# Patient Record
Sex: Female | Born: 1955 | Race: White | Hispanic: No | Smoking: Never smoker
Health system: Southern US, Community
[De-identification: ages and names within clinical notes are randomized; demographics above are authoritative.]

## PROBLEM LIST (undated history)

## (undated) ENCOUNTER — Ambulatory Visit: Admission: EM | Payer: PRIVATE HEALTH INSURANCE

## (undated) DIAGNOSIS — F32A Depression, unspecified: Secondary | ICD-10-CM

## (undated) DIAGNOSIS — Z973 Presence of spectacles and contact lenses: Secondary | ICD-10-CM

## (undated) DIAGNOSIS — J302 Other seasonal allergic rhinitis: Secondary | ICD-10-CM

## (undated) DIAGNOSIS — R51 Headache: Secondary | ICD-10-CM

## (undated) DIAGNOSIS — M47816 Spondylosis without myelopathy or radiculopathy, lumbar region: Secondary | ICD-10-CM

## (undated) DIAGNOSIS — G589 Mononeuropathy, unspecified: Secondary | ICD-10-CM

## (undated) DIAGNOSIS — E669 Obesity, unspecified: Secondary | ICD-10-CM

## (undated) DIAGNOSIS — E785 Hyperlipidemia, unspecified: Secondary | ICD-10-CM

## (undated) DIAGNOSIS — I10 Essential (primary) hypertension: Secondary | ICD-10-CM

## (undated) DIAGNOSIS — G4730 Sleep apnea, unspecified: Secondary | ICD-10-CM

## (undated) DIAGNOSIS — H9319 Tinnitus, unspecified ear: Secondary | ICD-10-CM

## (undated) DIAGNOSIS — E7410 Disorder of fructose metabolism, unspecified: Secondary | ICD-10-CM

## (undated) DIAGNOSIS — Z87898 Personal history of other specified conditions: Secondary | ICD-10-CM

## (undated) DIAGNOSIS — F329 Major depressive disorder, single episode, unspecified: Secondary | ICD-10-CM

## (undated) DIAGNOSIS — R7303 Prediabetes: Secondary | ICD-10-CM

## (undated) DIAGNOSIS — M1990 Unspecified osteoarthritis, unspecified site: Secondary | ICD-10-CM

## (undated) DIAGNOSIS — K219 Gastro-esophageal reflux disease without esophagitis: Secondary | ICD-10-CM

## (undated) DIAGNOSIS — H9190 Unspecified hearing loss, unspecified ear: Secondary | ICD-10-CM

## (undated) DIAGNOSIS — G2581 Restless legs syndrome: Secondary | ICD-10-CM

## (undated) DIAGNOSIS — R011 Cardiac murmur, unspecified: Secondary | ICD-10-CM

## (undated) DIAGNOSIS — R519 Headache, unspecified: Secondary | ICD-10-CM

## (undated) DIAGNOSIS — J45909 Unspecified asthma, uncomplicated: Secondary | ICD-10-CM

## (undated) DIAGNOSIS — F419 Anxiety disorder, unspecified: Secondary | ICD-10-CM

## (undated) DIAGNOSIS — Z8709 Personal history of other diseases of the respiratory system: Secondary | ICD-10-CM

## (undated) DIAGNOSIS — R112 Nausea with vomiting, unspecified: Secondary | ICD-10-CM

## (undated) DIAGNOSIS — Z8719 Personal history of other diseases of the digestive system: Secondary | ICD-10-CM

## (undated) DIAGNOSIS — Z87440 Personal history of urinary (tract) infections: Secondary | ICD-10-CM

## (undated) DIAGNOSIS — Z9889 Other specified postprocedural states: Secondary | ICD-10-CM

## (undated) HISTORY — DX: Depression, unspecified: F32.A

## (undated) HISTORY — DX: Cardiac murmur, unspecified: R01.1

## (undated) HISTORY — DX: Hyperlipidemia, unspecified: E78.5

## (undated) HISTORY — DX: Prediabetes: R73.03

## (undated) HISTORY — PX: WRIST SURGERY: SHX841

## (undated) HISTORY — DX: Disorder of fructose metabolism, unspecified: E74.10

## (undated) HISTORY — DX: Obesity, unspecified: E66.9

## (undated) HISTORY — DX: Spondylosis without myelopathy or radiculopathy, lumbar region: M47.816

## (undated) HISTORY — PX: OTHER SURGICAL HISTORY: SHX169

## (undated) HISTORY — DX: Major depressive disorder, single episode, unspecified: F32.9

## (undated) HISTORY — PX: DIAGNOSTIC LAPAROSCOPY: SUR761

## (undated) HISTORY — DX: Sleep apnea, unspecified: G47.30

## (undated) HISTORY — PX: DILATION AND CURETTAGE OF UTERUS: SHX78

---

## 1993-01-06 HISTORY — PX: ABDOMINAL HYSTERECTOMY: SHX81

## 2000-09-22 ENCOUNTER — Encounter: Admission: RE | Admit: 2000-09-22 | Discharge: 2000-09-22 | Payer: Self-pay | Admitting: Family Medicine

## 2000-09-22 ENCOUNTER — Encounter: Payer: Self-pay | Admitting: Family Medicine

## 2000-11-03 ENCOUNTER — Encounter: Admission: RE | Admit: 2000-11-03 | Discharge: 2000-11-11 | Payer: Self-pay | Admitting: Family Medicine

## 2000-11-03 ENCOUNTER — Encounter: Admission: RE | Admit: 2000-11-03 | Discharge: 2001-02-01 | Payer: Self-pay | Admitting: Family Medicine

## 2003-08-05 ENCOUNTER — Emergency Department (HOSPITAL_COMMUNITY): Admission: EM | Admit: 2003-08-05 | Discharge: 2003-08-05 | Payer: Self-pay | Admitting: Emergency Medicine

## 2003-10-23 ENCOUNTER — Encounter: Admission: RE | Admit: 2003-10-23 | Discharge: 2003-10-23 | Payer: Self-pay | Admitting: Family Medicine

## 2004-02-16 ENCOUNTER — Encounter: Admission: RE | Admit: 2004-02-16 | Discharge: 2004-02-16 | Payer: Self-pay | Admitting: Family Medicine

## 2004-03-19 ENCOUNTER — Encounter: Admission: RE | Admit: 2004-03-19 | Discharge: 2004-03-19 | Payer: Self-pay | Admitting: Gastroenterology

## 2004-09-30 ENCOUNTER — Emergency Department (HOSPITAL_COMMUNITY): Admission: EM | Admit: 2004-09-30 | Discharge: 2004-09-30 | Payer: Self-pay | Admitting: Emergency Medicine

## 2006-01-06 LAB — HM COLONOSCOPY: HM Colonoscopy: NORMAL

## 2006-04-07 ENCOUNTER — Other Ambulatory Visit: Admission: RE | Admit: 2006-04-07 | Discharge: 2006-04-07 | Payer: Self-pay | Admitting: Family Medicine

## 2006-04-15 ENCOUNTER — Ambulatory Visit (HOSPITAL_COMMUNITY): Admission: RE | Admit: 2006-04-15 | Discharge: 2006-04-15 | Payer: Self-pay | Admitting: Family Medicine

## 2006-09-22 ENCOUNTER — Emergency Department (HOSPITAL_COMMUNITY): Admission: EM | Admit: 2006-09-22 | Discharge: 2006-09-22 | Payer: Self-pay | Admitting: Emergency Medicine

## 2007-01-28 ENCOUNTER — Encounter: Admission: RE | Admit: 2007-01-28 | Discharge: 2007-04-28 | Payer: Self-pay | Admitting: Family Medicine

## 2007-05-28 ENCOUNTER — Emergency Department (HOSPITAL_COMMUNITY): Admission: EM | Admit: 2007-05-28 | Discharge: 2007-05-28 | Payer: Self-pay | Admitting: Emergency Medicine

## 2008-06-18 ENCOUNTER — Emergency Department (HOSPITAL_COMMUNITY): Admission: EM | Admit: 2008-06-18 | Discharge: 2008-06-19 | Payer: Self-pay | Admitting: Emergency Medicine

## 2009-04-06 HISTORY — PX: KNEE SURGERY: SHX244

## 2010-01-06 LAB — HM MAMMOGRAPHY: HM Mammogram: NORMAL

## 2010-07-18 ENCOUNTER — Ambulatory Visit (INDEPENDENT_AMBULATORY_CARE_PROVIDER_SITE_OTHER): Payer: BC Managed Care – PPO | Admitting: Family Medicine

## 2010-07-18 DIAGNOSIS — E785 Hyperlipidemia, unspecified: Secondary | ICD-10-CM

## 2010-07-18 NOTE — Progress Notes (Signed)
Medical Nutrition Therapy:  Appt start time: 1000 end time:  1100.  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  Usual eating pattern includes 3 meals and 0-2 snacks per day.  Everyday foods include ~10 pork skins (= 9 g pro, 0 g CHO, 44 kcal), 3-5 c black tea, 2 c black coffee, 24 oz diet Coke.  Avoided foods include tomatoes, citrus (mouth sores), certain toothpastes give bad taste that persists.  Same sensation w/ too many diet Cokes.  Also avoids beans, broccoli (xs gas), corn (stomach ache).   24-hr recall suggests an intake of ~1100 kcal: B (9:30 AM)- 4 egg whites, 2 c pro-fortified almond milk (10 g pro, 2 g CHO), 1 c black coffee; Snk ( AM)- 1 c blk coffee; L (1 PM)- 1 [place] patty (28 g pro, 88 kcal), 1/2 c mshd potatoes (20 g CHO, 138 kcal), blk tea; D (7 PM)- Steak & Shake grilled chx (27 g pro) salad, 2 tbsp ranch + 2 tbsp f-f ranch (6 g CHO, 219 kcal); Snk (8 PM)- 10 pork skins (9 g protein, 80 kcal), 100-kcal choc cake (15 g CHO).  Yesterday was atypical b/c Safiatou seldom eats out.  Usual physical activity includes going to the gym 1-3 X wk, where she does ~30-min wts or 30-min TM walking [employer]).  She has been having L heel pain, which is somewhat limiting, and gym time has been reduced in the past 3 wks b/c of a broken finger from the gym.   [name] has lost 20 lb since June 12, when she started a wt loss program w/ Dr. Feliciana Rossetti.  The program includes weekly injections of HCG, monthly injxns B12, and a severely low-energy and -CHO diet.  The diet promotes a 3-wk rotation of 600-800 kcal the 1st 2 wks, and 1000-1200 kcal wk 3.  [name] has used the very low (600-800) kcal diet only.  Other rules of the diet include bkfst of at lst 30 g protein & no more than 4-10 g CHO; lunch and dinner of  at lst 15 g protein (low-fat frozen meals recommended), and snacks of sugar-free Jell-O, fruit, beef jerky, or protein bars.  She is ambivalent re. the HCG and B12 injxns, noting that it seems likely she would have lost wt w/ the kcal restriction alone.    Progress Towards Goal(s):  In progress.   Nutritional Diagnosis:  NB-2.1 Physical inactivity As related to heel pain.  As evidenced by exercise only 2 X wk currently. [place]-2.1 Inpaired nutrition utilization As related to fructose malabsorption.  As evidenced by diagnosis of such in Feb 2012.    Intervention:  Nutrition education.  Monitoring/Evaluation:  Dietary intake, exercise, and body weight in 3 weeks.

## 2010-07-18 NOTE — Patient Instructions (Addendum)
-   Consider seeing a dr at [facility] for your heel pain [phone]).   - If you are trying to lose weight, you want to preserve as much lean tissue (muscle) as possible.  Your high-protein intake if helpful for that; however, when you restrict kcal too severely, you end up losing muscle tissue anyway.  So- ideally, you will increase kcal at least a bit, continue high-protein intake, and increase physical activity.   - Foods to add:  veg's (check out fructose contents first); lean protein (meat, fish, poultry, eggs, dairy), limited fruit (one per day).   - Keep your yogurt intake limited to [place] type, and no more than 6 oz at a time.   - For now, stationary biking will be helpful to energy expenditure.   - Exercise goal:  35 min 5 X wk.   - For weight loss and for best health, it's crucial that we consider the OTHER 23 hours of the day besides gym time:  Look for exercise opportunities throughout the day, and try to never sit longer then 30 min at a time.   - Be sure you eat each of your 3 meals; do not under-eat.   - Fructose malabsorption:  Look over list of foods provided, and develop your own list of "allowed foods" that you want to try, and determine which of these work for you.   - Consider Weigh to Wellness class, which starts Sep 4 (meets each Tue, 5:30-6:30 PM at [facility]).   - Futures trader Meals:  [phone].   - Google "Lafonda Mosses" and his views on sugar in the American diet.   - Call Dr. Gerilyn Pilgrim if any Qs:  [phone].   - Call next week for Aug appt.

## 2010-07-25 ENCOUNTER — Ambulatory Visit (INDEPENDENT_AMBULATORY_CARE_PROVIDER_SITE_OTHER): Payer: BC Managed Care – PPO | Admitting: Family Medicine

## 2010-07-25 ENCOUNTER — Encounter: Payer: Self-pay | Admitting: Family Medicine

## 2010-07-25 DIAGNOSIS — M25561 Pain in right knee: Secondary | ICD-10-CM

## 2010-07-25 DIAGNOSIS — M79609 Pain in unspecified limb: Secondary | ICD-10-CM

## 2010-07-25 DIAGNOSIS — M25569 Pain in unspecified knee: Secondary | ICD-10-CM

## 2010-07-25 DIAGNOSIS — M79673 Pain in unspecified foot: Secondary | ICD-10-CM

## 2010-07-25 NOTE — Assessment & Plan Note (Signed)
2/2 plantar fasciitis.  No h/o cortisone injection to predispose to fat pad atrophy.  Negative calcaneal squeeze for stress fracture.  Has not adequately rehabilitated this - start home exercise program - exercises and stretches shown and advised to do these daily, handout provided.  Add heel lifts, arch binders bilaterally.  Ice, tylenol and/or mobic.  Consider cortisone injection in future if not improving as expected - pain level on exam does not suggest this will be necessary.  Can also consider formal PT.

## 2010-07-25 NOTE — Patient Instructions (Signed)
You have plantar fasciitis of both heels. Take tylenol or mobic as needed for pain  Plantar fascia stretch for 20-30 seconds (do 3 of these) in morning Lowering/raise on a step exercises 3 x 15 once or twice a day - this is very important for long term recovery. Can add heel walks, toe walks forward and backward as well Ice bucket 10-15 minutes at end of day Avoid flat shoes/barefoot walking as much as possible. Arch straps have been shown to help with pain. Heel lifts also help with pain by avoiding fully stretching the plantar fascia except when doing home exercises. Orthotics with heel lift may be helpful. Steroid injection is a consideration for short term pain relief if you are struggling. Physical therapy is also an option. >90% improve by 12 months with or without treatment but the above things improve the condition faster.  Your right knee exam is negative for damage to your ligaments, meniscus, or other concerning injury. This is likely due to arthritis that has flared up with increased activity. Take tylenol 500mg  1-2 tabs three times a day for pain. Ok to take mobic daily with food with this as needed also. Glucosamine sulfate 750mg  twice a day is a supplement that has been shown to help moderate to severe arthritis. Cortisone injections are an option if you do not improve. Shoe inserts with good arch support may be helpful. Ice 15 minutes at a time 3-4 times a day as needed to help with pain. Water aerobics and cycling with low resistance are the best two types of exercise for arthritis. Follow up with me in 6 weeks for a recheck on how your heels are doing.

## 2010-07-25 NOTE — Assessment & Plan Note (Signed)
Patient has known DJD.  Exam otherwise negative for meniscal, ligamentous damage and no injury to suggest fracture, patellar dislocation.  Testing negative for pes bursitis, patellar tendinopathy, patellofemoral syndrome.  Favor DJD flare from her increased activity.  Reassured.  Start with tylenol, can continue mobic as needed.  Icing as needed.

## 2010-07-25 NOTE — Progress Notes (Signed)
Subjective:    Patient ID: Taylor Silva, female    DOB: [birth year], 54 y.o.   MRN: [id number]  PCP: Dr. Gweneth Dimitri  HPI 55 yo F here for bilateral heel pain, right knee pain  1. Bilateral heel pain Patient reports several year history of bilateral heel pain. She did not seek care from this. Tried to self treat by trying several different shoe types and and inserts including heel cups. Now wearing full length orthotics with arch support. Pain is on plantar portion of heels. Has not tried exercises/stretches, or injection. Pain worse first thing in the morning. No pain at rest currently - hurts to stand up.  2. Right knee pain Reports she had arthroscopic procedure for debridement about 1 year ago. She did not get better with a second cortisone injection so went on to have this procedure. 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 Has been walking more for exercise. No new injury but pain started about 3 weeks ago. Takes mobic for pain No giving out, locking, catching. Pain felt anterior in knee.  Past Medical History  Diagnosis Date  . Fructose intolerance     Current Outpatient Prescriptions on File Prior to Visit  Medication Sig Dispense Refill  . aspirin-acetaminophen-caffeine (EXCEDRIN MIGRAINE) 250-250-65 MG per tablet Take 1 tablet by mouth every 6 (six) hours as needed.        Marland Kitchen azelastine (ASTELIN) 137 MCG/SPRAY nasal spray Place 1 spray into the nose 2 (two) times daily. Use in each nostril as directed       . b complex vitamins capsule Take 1 capsule by mouth daily.        . calcium carbonate (TUMS - DOSED IN MG ELEMENTAL CALCIUM) 500 MG chewable tablet Chew 2 tablets by mouth daily.        Marland Kitchen estrogens, conjugated, (PREMARIN) 0.625 MG tablet Take 0.625 mg by mouth daily. Take daily for 21 days then do not take for 7 days.       . fexofenadine (ALLEGRA) 180 MG tablet Take 180 mg by mouth daily.        . fish oil-omega-3 fatty acids 1000 MG  capsule Take 1,000 mg by mouth daily.        . Glucosamine 500 MG CAPS Take 1 capsule by mouth.        Marland Kitchen glucosamine-chondroitin 500-400 MG tablet Take 1 tablet by mouth 3 (three) times daily.        . human chorionic gonadotropin (PREGNYL/NOVAREL) 10000 UNITS injection Inject 35 Units into the muscle once a week.       . meloxicam (MOBIC) 15 MG tablet Take 15 mg by mouth daily.        . Multiple Vitamins-Minerals (MULTIVITAMIN WITH MINERALS) tablet Take 1 tablet by mouth daily.        . pantoprazole (PROTONIX) 40 MG tablet Take 40 mg by mouth daily.        . phentermine 37.5 MG capsule Take 37.5 mg by mouth every morning.        . Probiotic Product (ALIGN PO) Take 1 capsule by mouth.        Marland Kitchen rOPINIRole (REQUIP) 0.5 MG tablet Take 1 mg by mouth every evening.        . topiramate (TOPAMAX) 100 MG tablet Take 100 mg by mouth at bedtime.        Marland Kitchen VITAMIN D, CHOLECALCIFEROL, PO Take 2,000 Units by mouth.  Past Surgical History  Procedure Date  . Knee surgery     right knee  . Abdominal hysterectomy     Allergies  Allergen Reactions  . Codeine   . Penicillins   . Sulfa Antibiotics   . Tetracyclines & Related     History   Social History  . Marital Status: Married    Spouse Name: N/A    Number of Children: N/A  . Years of Education: N/A   Occupational History  . Not on file.   Social History Main Topics  . Smoking status: Never Smoker   . Smokeless tobacco: Not on file  . Alcohol Use: Not on file  . Drug Use: Not on file  . Sexually Active: Not on file   Other Topics Concern  . Not on file   Social History Narrative  . No narrative on file    Family History  Problem Relation Age of Onset  . Diabetes Mother   . Hyperlipidemia Mother   . Hypertension Mother   . Heart attack Mother   . Diabetes Sister   . Hyperlipidemia Sister   . Hypertension Sister   . Diabetes Brother   . Hyperlipidemia Brother   . Hypertension Brother   . Heart attack Brother   .  Diabetes Maternal Grandmother   . Hyperlipidemia Maternal Grandmother   . Hypertension Maternal Grandmother     BP 113/81  Pulse 86  Review of Systems See HPI above.    Objective:   Physical Exam Gen: NAD    Bilateral feet/ankles: Mod overpronation No gross deformity, swelling, ecchymoses FROM ankles No TTP (plantar anterior calcaneus area of pain when she has this per patient). Thompsons test negative. NV intact distally. Negative calcaneal squeeze  R knee: No gross deformity, ecchymoses, swelling.  Landmarks difficult to differentiate 2/2 soft tissue. Lateral TTP but not at joint line.  No medial, patellar tendon, post patellar facet, pes TTP. FROM. Negative ant/post drawers. Negative valgus/varus testing. Negative lachmanns. Negative mcmurrays, apleys, patellar apprehension, clarkes. NV intact distally.  Assessment & Plan:  1. Bilateral heel pain - 2/2 plantar fasciitis.  No h/o cortisone injection to predispose to fat pad atrophy.  Negative calcaneal squeeze for stress fracture.  Has not adequately rehabilitated this - start home exercise program - exercises and stretches shown and advised to do these daily, handout provided.  Add heel lifts, arch binders bilaterally.  Ice, tylenol and/or mobic.  Consider cortisone injection in future if not improving as expected - pain level on exam does not suggest this will be necessary.  Can also consider formal PT.  2. Right knee pain - Patient has known DJD.  Exam otherwise negative for meniscal, ligamentous damage and no injury to suggest fracture, patellar dislocation.  Testing negative for pes bursitis, patellar tendinopathy, patellofemoral syndrome.  Favor DJD flare from her increased activity.  Reassured.  Start with tylenol, can continue mobic as needed.  Icing as needed.

## 2010-07-29 ENCOUNTER — Ambulatory Visit: Payer: BC Managed Care – PPO | Admitting: Family Medicine

## 2011-05-26 ENCOUNTER — Emergency Department (HOSPITAL_COMMUNITY)
Admission: EM | Admit: 2011-05-26 | Discharge: 2011-05-26 | Disposition: A | Discharge status: Home / Self Care | Payer: BC Managed Care – PPO | Source: Home / Self Care | Attending: Emergency Medicine | Admitting: Emergency Medicine

## 2011-05-26 ENCOUNTER — Encounter (HOSPITAL_COMMUNITY): Payer: Self-pay

## 2011-05-26 DIAGNOSIS — IMO0002 Reserved for concepts with insufficient information to code with codable children: Secondary | ICD-10-CM

## 2011-05-26 DIAGNOSIS — S46912A Strain of unspecified muscle, fascia and tendon at shoulder and upper arm level, left arm, initial encounter: Secondary | ICD-10-CM

## 2011-05-26 DIAGNOSIS — M62838 Other muscle spasm: Secondary | ICD-10-CM

## 2011-05-26 HISTORY — DX: Restless legs syndrome: G25.81

## 2011-05-26 HISTORY — DX: Other seasonal allergic rhinitis: J30.2

## 2011-05-26 HISTORY — DX: Unspecified osteoarthritis, unspecified site: M19.90

## 2011-05-26 MED ORDER — DIAZEPAM 5 MG PO TABS: 5.0000 mg | ORAL_TABLET | Freq: Four times a day (QID) | ORAL | Status: AC | PRN

## 2011-05-26 MED ORDER — MELOXICAM 15 MG PO TABS: 15.0000 mg | ORAL_TABLET | Freq: Every day | ORAL | Status: DC

## 2011-05-26 MED ORDER — HYDROCODONE-ACETAMINOPHEN 5-325 MG PO TABS: 2.0000 | ORAL_TABLET | ORAL | Status: DC | PRN

## 2011-05-26 NOTE — ED Notes (Addendum)
C/o pain in left shoulder since working on a couch a couple of days ago; denies injury ; pain noted on palpation of shoulder muscle, but no pain on palpation of clavicle

## 2011-05-26 NOTE — ED Provider Notes (Signed)
History     CSN: [id number]  Arrival date & time 05/26/11  1255   First MD Initiated Contact with Patient 05/26/11 1526      Chief Complaint  Patient presents with  . Shoulder Pain    (Consider location/radiation/quality/duration/timing/severity/associated sxs/prior treatment) HPI Comments: Patient states that she was repholstering during a couch  by herself several days ago, and reports achy left neck and shoulder pain starting later that night. Pain has gotten worse since then, it is worse with movements, use. She does not recall hearing any pop, or specific movement that caused her symptoms. No nausea, vomiting, weakness, bruising, deformity. No alleviating factors. She's not tried anything for this. She has no recent or remote history of injury to her neck or shoulder. She does have a history of arthritis.  ROS as noted in HPI. All other ROS negative.   Patient is a 56 y.o. female presenting with shoulder pain. The history is provided by the patient. No language interpreter was used.  Shoulder Pain This is a new problem. The current episode started more than 2 days ago. The problem occurs constantly. The problem has not changed since onset.Pertinent negatives include no chest pain, no abdominal pain and no shortness of breath.    Past Medical History  Diagnosis Date  . Fructose intolerance   . Arthritis   . Seasonal allergies   . Restless legs     Past Surgical History  Procedure Date  . Knee surgery     right knee  . Abdominal hysterectomy     Family History  Problem Relation Age of Onset  . Diabetes Mother   . Hyperlipidemia Mother   . Hypertension Mother   . Heart attack Mother   . Diabetes Sister   . Hyperlipidemia Sister   . Hypertension Sister   . Diabetes Brother   . Hyperlipidemia Brother   . Hypertension Brother   . Heart attack Brother   . Diabetes Maternal Grandmother   . Hyperlipidemia Maternal Grandmother   . Hypertension Maternal Grandmother       History  Substance Use Topics  . Smoking status: Never Smoker   . Smokeless tobacco: Not on file  . Alcohol Use: No    OB History    Grav Para Term Preterm Abortions TAB SAB Ect Mult Living                  Review of Systems  Respiratory: Negative for shortness of breath.   Cardiovascular: Negative for chest pain.  Gastrointestinal: Negative for abdominal pain.    Allergies  Codeine; Penicillins; Sulfa antibiotics; and Tetracyclines & related  Home Medications   Current Outpatient Rx  Name Route Sig Dispense Refill  . HYDROCHLOROTHIAZIDE 12.5 MG PO CAPS Oral Take 12.5 mg by mouth daily.    Marland Kitchen LORATADINE-PSEUDOEPHEDRINE ER 10-240 MG PO TB24 Oral Take 1 tablet by mouth daily.    Marland Kitchen ALIGN PO Oral Take 1 capsule by mouth.      . ASPIRIN-ACETAMINOPHEN-CAFFEINE 250-250-65 MG PO TABS Oral Take 1 tablet by mouth every 6 (six) hours as needed.      . AZELASTINE HCL 137 MCG/SPRAY NA SOLN Nasal Place 1 spray into the nose 2 (two) times daily. Use in each nostril as directed     . B COMPLEX VITAMINS PO CAPS Oral Take 1 capsule by mouth daily.      Marland Kitchen CALCIUM CARBONATE ANTACID 500 MG PO CHEW Oral Chew 2 tablets by mouth daily.      Marland Kitchen  DIAZEPAM 5 MG PO TABS Oral Take 1 tablet (5 mg total) by mouth every 6 (six) hours as needed for anxiety. 16 tablet 0  . OMEGA-3 FATTY ACIDS 1000 MG PO CAPS Oral Take 1,000 mg by mouth daily.      Marland Kitchen GLUCOSAMINE 500 MG PO CAPS Oral Take 1 capsule by mouth.      Marland Kitchen GLUCOSAMINE-CHONDROITIN 500-400 MG PO TABS Oral Take 1 tablet by mouth 3 (three) times daily.      Marland Kitchen HYDROCODONE-ACETAMINOPHEN 5-325 MG PO TABS Oral Take 2 tablets by mouth every 4 (four) hours as needed for pain. 20 tablet 0  . MELOXICAM 15 MG PO TABS Oral Take 1 tablet (15 mg total) by mouth daily. 14 tablet 0  . MULTI-VITAMIN/MINERALS PO TABS Oral Take 1 tablet by mouth daily.      Marland Kitchen PANTOPRAZOLE SODIUM 40 MG PO TBEC Oral Take 40 mg by mouth daily.      Marland Kitchen PHENTERMINE HCL 37.5 MG PO CAPS Oral  Take 37.5 mg by mouth every morning.      Marland Kitchen ROPINIROLE HCL 0.5 MG PO TABS Oral Take 1 mg by mouth every evening.      Marland Kitchen VITAMIN D (CHOLECALCIFEROL) PO Oral Take 2,000 Units by mouth.        BP 122/87  Pulse 82  Temp(Src) 98.3 F (36.8 C) (Oral)  Resp 16  SpO2 100%  Physical Exam  Nursing note and vitals reviewed. Constitutional: She is oriented to person, place, and time. She appears well-developed and well-nourished. No distress.  HENT:  Head: Normocephalic and atraumatic.  Eyes: Conjunctivae and EOM are normal.  Neck: Normal range of motion. Muscular tenderness present. Normal range of motion present.    Cardiovascular: Normal rate.   Pulmonary/Chest: Effort normal.  Abdominal: She exhibits no distension.  Musculoskeletal: Normal range of motion.       Left shoulder with ROM mildly limited, Drop test normal,  sternoclavicular joint NT, clavicle NT , A/C joint NT , scapula NT, proximal humerus NT  shoulder joint NT, Motor strength midly decreased at shoulder due to pain, Sensation intact LT over deltoid region, distal NVI with hand having intact sensation and strength in the distribution of the median, radial, and ulnar nerve. Negative tenderness in bicipital groove,  negative empty can test , negative liftoff test,  no instability with abduction/external rotation.  Neurological: She is alert and oriented to person, place, and time.  Skin: Skin is warm and dry.  Psychiatric: She has a normal mood and affect. Her behavior is normal. Judgment and thought content normal.    ED Course  Procedures (including critical care time)  Labs Reviewed - No data to display No results found.   1. Left shoulder strain   2. Muscle spasm     MDM  H&P most consistent with muscle strain/spasm. No history of trauma. No bony tenderness, deformity, no instability on exam. Deferring x-ray at this time. Will start her on Valium, meloxicam, and she states that this has worked for her well in the  past. Patient states that codeine and morphine cause itching and rash. No anaphylaxis. Will also try Norco - states she's never tried hydrocodone or oxycodone in the past and is not sure she can tolerate this or not. Advised her do not use this if she does well with just the meloxicam and Valium. Will have her followup with the [place] [facility] if no improvement in 10 days. Patient agrees with plan.  Luiz Blare, MD 05/26/11 Paulo Fruit

## 2011-05-26 NOTE — Discharge Instructions (Signed)
[facility]: [address] [place] [address] [phone]  [facility]: [address] [place] [address]   [phone]  [facility]: [address] [place] [address] [phone]  [facility] : [address] [place] [address] [phone]  [facility]: [address] [place] [address] [phone]  [facility]: [address] [place] [address] [phone]  Dr. Mahima Pandey [address] [facility]   [address] [phone]  

## 2011-05-30 ENCOUNTER — Ambulatory Visit (INDEPENDENT_AMBULATORY_CARE_PROVIDER_SITE_OTHER): Payer: BC Managed Care – PPO | Admitting: Family

## 2011-05-30 ENCOUNTER — Encounter: Payer: Self-pay | Admitting: Family

## 2011-05-30 VITALS — BP 112/80 | HR 96 | Temp 97.8°F | Resp 16 | Ht 65.5 in | Wt 250.1 lb

## 2011-05-30 DIAGNOSIS — G4733 Obstructive sleep apnea (adult) (pediatric): Secondary | ICD-10-CM

## 2011-05-30 DIAGNOSIS — J029 Acute pharyngitis, unspecified: Secondary | ICD-10-CM

## 2011-05-30 DIAGNOSIS — Z0000 Encounter for general adult medical examination without abnormal findings: Secondary | ICD-10-CM

## 2011-05-30 DIAGNOSIS — M2550 Pain in unspecified joint: Secondary | ICD-10-CM

## 2011-05-30 DIAGNOSIS — F3289 Other specified depressive episodes: Secondary | ICD-10-CM

## 2011-05-30 DIAGNOSIS — R635 Abnormal weight gain: Secondary | ICD-10-CM

## 2011-05-30 DIAGNOSIS — F329 Major depressive disorder, single episode, unspecified: Secondary | ICD-10-CM

## 2011-05-30 LAB — BASIC METABOLIC PANEL
BUN: 13 mg/dL (ref 6–23)
CO2: 30 mEq/L (ref 19–32)
Calcium: 9.6 mg/dL (ref 8.4–10.5)
Chloride: 101 mEq/L (ref 96–112)
Creat: 0.91 mg/dL (ref 0.50–1.10)
Glucose, Bld: 95 mg/dL (ref 70–99)
Potassium: 4.2 mEq/L (ref 3.5–5.3)
Sodium: 139 mEq/L (ref 135–145)

## 2011-05-30 LAB — SEDIMENTATION RATE: Sed Rate: 14 mm/hr (ref 0–22)

## 2011-05-30 MED ORDER — BUPROPION HCL 75 MG PO TABS: 75.0000 mg | ORAL_TABLET | Freq: Two times a day (BID) | ORAL | Status: DC

## 2011-05-30 NOTE — Progress Notes (Signed)
Subjective:    Patient ID: Taylor Silva, female    DOB: 01-28-1955, 57 y.o.   MRN: [id number]  HPI  Ms.  Silva is a 56 yr old female here today to establish care.    Joint pain-  She reports joint pain, occasional wrist swelling.  Reports that she had swelling overlying collar bone after moving furniture.  Was seen at urgent care and was placed back on meloxicam and valium.  She reports that the next day arm felt fine.  Knees swell and occasionally hurt so bad that she can barely walk.  Hips/right shoulder pain.  Meloxicam had helped in the past, but tha there creatinine went up (Dr. Selena Batten).  She reports need to "move her legs at night."  She was placed on ropinol which helped on some days.    Sore throat- she report that sore is "bad." Reports that tmax 100.  Feels a little better than yesterday.  OSA- reports sleep study 1 year ago.  Could not tolerated the CPAP.  Depression- reports good days, bad days.  Denies SI.  Could stay home all day. Some tearfulness.  She worries all the time. She has tried prozac without improvement.    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   Past Medical History  Diagnosis Date  . Fructose intolerance   . Arthritis   . Seasonal allergies   . Restless legs   . Depression   . History of chicken pox   . Hyperlipidemia   . Heart murmur     History   Social History  . Marital Status: Married    Spouse Name: N/A    Number of Children: 1  . Years of Education: N/A   Occupational History  .  [employer]   Social History Main Topics  . Smoking status: Never Smoker   . Smokeless tobacco: Never Used  . Alcohol Use: No  . Drug Use: No  . Sexually Active: Not on file   Other Topics Concern  . 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    Past Surgical History  Procedure Date  . Knee surgery 04/2009    right knee, meniscus tear  . Wrist surgery 1980s    cyst removed from right wrist  . Abdominal hysterectomy 1995  . Other surgical history 1995?    removed scar tissue from colon    Family History  Problem Relation Age of Onset  . Diabetes Mother   . Hyperlipidemia Mother   . Hypertension Mother   . Heart attack Mother   . Diabetes Sister   . Hyperlipidemia Sister   . Hypertension Sister   . Diabetes Brother   . Hyperlipidemia Brother   . Hypertension Brother   . Heart attack Brother   . Diabetes Maternal Grandmother   . Hyperlipidemia Maternal Grandmother   . Hypertension Maternal Grandmother   . Heart disease Father   . Leukemia Father     Allergies  Allergen Reactions  . Codeine   . Penicillins   . Sulfa Antibiotics   . Tetracyclines & Related     Current Outpatient Prescriptions on File Prior to Visit  Medication Sig Dispense Refill  . aspirin-acetaminophen-caffeine (EXCEDRIN MIGRAINE) [phone]  MG per tablet Take 1 tablet by mouth every 6 (six) hours as needed.        Marland Kitchen b complex vitamins capsule Take 1 capsule by mouth daily.        Marland Kitchen buPROPion (WELLBUTRIN) 75 MG tablet Take 1 tablet (75 mg total) by mouth 2 (two) times daily.  60 tablet  1  . calcium carbonate (TUMS - DOSED IN MG ELEMENTAL CALCIUM) 500 MG chewable tablet Chew 2 tablets by mouth daily.        . diazepam (VALIUM) 5 MG tablet Take 1 tablet (5 mg total) by mouth every 6 (six) hours as needed for anxiety.  16 tablet  0  . fish oil-omega-3 fatty acids 1000 MG capsule Take 1,000 mg by mouth daily.        . Glucosamine 500 MG CAPS Take 1 capsule by mouth.        Marland Kitchen glucosamine-chondroitin 500-400 MG tablet Take 1 tablet by mouth 3 (three) times daily.        Marland Kitchen loratadine-pseudoephedrine (CLARITIN-D 24-HOUR) 10-240 MG per 24 hr tablet Take 1 tablet by mouth daily.      .  meloxicam (MOBIC) 15 MG tablet Take 1 tablet (15 mg total) by mouth daily.  14 tablet  0  . Multiple Vitamins-Minerals (MULTIVITAMIN WITH MINERALS) tablet Take 1 tablet by mouth daily.        . pantoprazole (PROTONIX) 40 MG tablet Take 40 mg by mouth daily.        . Probiotic Product (ALIGN PO) Take 1 capsule by mouth.        Marland Kitchen rOPINIRole (REQUIP) 0.5 MG tablet Take 1 mg by mouth every evening.        Marland Kitchen VITAMIN D, CHOLECALCIFEROL, PO Take 2,000 Units by mouth.        . DISCONTD: estrogens, conjugated, (PREMARIN) 0.625 MG tablet Take 0.625 mg by mouth daily. Take daily for 21 days then do not take for 7 days.       Marland Kitchen DISCONTD: fexofenadine (ALLEGRA) 180 MG tablet Take 180 mg by mouth daily.        Marland Kitchen DISCONTD: topiramate (TOPAMAX) 100 MG tablet Take 100 mg by mouth at bedtime.          BP 112/80  Pulse 96  Temp(Src) 97.8 F (36.6 C) (Oral)  Resp 16  Ht 5' 5.5" (1.664 m)  Wt 250 lb 1.9 oz (113.454 kg)  BMI 40.99 kg/m2  SpO2 99%       Objective:   Physical Exam  Constitutional: She appears well-developed and well-nourished. No distress.  Cardiovascular: Normal rate and regular rhythm.   No murmur heard. Pulmonary/Chest: Breath sounds normal. No respiratory distress. She has no wheezes. She has no rales. She exhibits no tenderness.  Musculoskeletal: She exhibits no edema.       No joint swelling is note.   Psychiatric: She has a normal mood and affect. Her speech is normal and behavior is normal. Judgment and thought content normal. Cognition and memory are normal.          Assessment & Plan:  45 minutes spent with pt today.  >50% of this time was spent counseling pt on her depression and  joint pain.

## 2011-05-30 NOTE — Patient Instructions (Signed)
Please complete your lab work prior to leaving today. Follow up in 1 month, sooner if problems of concerns.  Welcome to [facility]!

## 2011-05-31 LAB — RHEUMATOID FACTOR: Rheumatoid fact SerPl-aCnc: <10 IU/mL (ref <=14)

## 2011-06-03 ENCOUNTER — Encounter: Payer: Self-pay | Admitting: Family

## 2011-06-03 LAB — ANA: Anti Nuclear Antibody(ANA): NEGATIVE

## 2011-06-06 DIAGNOSIS — J029 Acute pharyngitis, unspecified: Secondary | ICD-10-CM | POA: Insufficient documentation

## 2011-06-06 DIAGNOSIS — G4733 Obstructive sleep apnea (adult) (pediatric): Secondary | ICD-10-CM | POA: Insufficient documentation

## 2011-06-06 DIAGNOSIS — M2550 Pain in unspecified joint: Secondary | ICD-10-CM | POA: Insufficient documentation

## 2011-06-06 DIAGNOSIS — F329 Major depressive disorder, single episode, unspecified: Secondary | ICD-10-CM | POA: Insufficient documentation

## 2011-06-06 NOTE — Assessment & Plan Note (Signed)
Deteriorated.  Trial of Wellbutrin.

## 2011-06-06 NOTE — Assessment & Plan Note (Signed)
Rapid strep negative. Likely viral etiology.

## 2011-06-06 NOTE — Assessment & Plan Note (Signed)
She declines CPAP as she did not tolerate.

## 2011-06-06 NOTE — Assessment & Plan Note (Signed)
ANA, RA, ESR all normal. Suspect OA.  She was given short course of meloxicam from urgent care which should help.

## 2011-06-23 ENCOUNTER — Telehealth: Payer: Self-pay | Admitting: Family

## 2011-06-23 NOTE — Telephone Encounter (Signed)
Received medical records from [facility]  P: [phone] F: [phone]

## 2011-06-27 ENCOUNTER — Ambulatory Visit: Payer: BC Managed Care – PPO | Admitting: Family

## 2011-07-28 ENCOUNTER — Ambulatory Visit (INDEPENDENT_AMBULATORY_CARE_PROVIDER_SITE_OTHER): Payer: BC Managed Care – PPO | Admitting: Internal Medicine

## 2011-07-28 VITALS — BP 128/82 | HR 76 | Temp 98.3°F | Ht 67.0 in | Wt 260.0 lb

## 2011-07-28 DIAGNOSIS — F329 Major depressive disorder, single episode, unspecified: Secondary | ICD-10-CM

## 2011-07-28 DIAGNOSIS — E669 Obesity, unspecified: Secondary | ICD-10-CM

## 2011-07-28 DIAGNOSIS — R109 Unspecified abdominal pain: Secondary | ICD-10-CM

## 2011-07-28 LAB — POCT URINALYSIS DIPSTICK
Glucose, UA: NEGATIVE
Nitrite, UA: NEGATIVE
Protein, UA: NEGATIVE
Urobilinogen, UA: 0.2
pH, UA: 6

## 2011-07-28 LAB — CBC WITH DIFFERENTIAL/PLATELET
Basophils Absolute: 0.1 10*3/uL (ref 0.0–0.1)
Eosinophils Absolute: 0.1 10*3/uL (ref 0.0–0.7)
Hemoglobin: 12.8 g/dL (ref 12.0–15.0)
Lymphocytes Relative: 45.5 % (ref 12.0–46.0)
MCHC: 33 g/dL (ref 30.0–36.0)
Neutro Abs: 3.6 10*3/uL (ref 1.4–7.7)
RDW: 14.2 % (ref 11.5–14.6)

## 2011-07-28 NOTE — Progress Notes (Signed)
  Subjective:    Patient ID: Taylor Silva, female    DOB: [birth year], 56 y.o.   MRN: [id number]  HPI New patient to me, has seen Ms. O'sullivan before. Her chief complaint is abdominal pain going on for 6 months, is bilateral although it started on the right side. It is very distal at the quadrants.  The pain is on and off, not chang w/  eating or having a bowel movement. In the scale of 0-10 is a 3. She is also worried about her weight, wonders if she could get a prescription for weight loss medication, see assessment and plan. 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 Note nausea, vomiting, diarrhea. No blood in the stools. She has chronic constipation, stools are normal in appearance. Mild vag d/c, white. No recent sexual activity    Objective:   Physical Exam  Abdominal:     General -- alert, well-developed, and overweight appearing. No apparent distress.  Neck --no LADs Lungs -- normal respiratory effort, no intercostal retractions, no accessory muscle use, and normal breath sounds.   Heart-- normal rate, regular rhythm, no murmur, and no gallop.   Abdomen--soft, non-tender, no distention, no masses,  no HSM, no guarding, and no rigidity.  No inguinal hernias  Neurologic-- alert & oriented X3 and strength normal in all extremities. Psych-- Cognition and judgment appear intact. Alert and cooperative with normal attention span and concentration.  not anxious appearing and not depressed appearing.      Assessment & Plan:

## 2011-07-28 NOTE — Assessment & Plan Note (Signed)
6 months history of lower abdominal pain, review of systems is negative except for chronic constipation. Reportedly had a colonoscopy at age 56, tortuous colon? She has a history of a hysterectomy followup by pelvic surgery due to adhesions. Plan: Refer to gynecology Urinalysis shows some blood, will get a urine culture and treat if appropriate.

## 2011-07-28 NOTE — Assessment & Plan Note (Signed)
Long history of obesity, 20 years ago she was up to 325 pounds, then she lost weight, in the last few years she is gradually increasing her weight. Weight today 260 pounds. Request a "diet pill" reports that before she lost weight with HCG shots-phentermine-Topamax as prescribed by  Dr. Quintella Reichert. I really don't feel comfortable at this point prescribing phentermine for her.

## 2011-07-28 NOTE — Assessment & Plan Note (Signed)
History of fructose malabsorption diagnosed in 2011 after she was evaluated at [facility] with "gas", abdominal pain and mucus in the stools. History of a colonoscopy at age 56.

## 2011-07-28 NOTE — Assessment & Plan Note (Addendum)
In the past, Prozac didn't help much. Cymbalta, couldn't tolerate due  to pain in the legs Currently on Wellbutrin, not completely satisfied with the results. We discussed possibly a referral but she's not ready for that at this point, she has actually taking Wellbutrin on average once a day consequently she will take it twice a day and reassess in the next visit

## 2011-07-30 ENCOUNTER — Emergency Department (HOSPITAL_COMMUNITY): Payer: BC Managed Care – PPO

## 2011-07-30 ENCOUNTER — Emergency Department (HOSPITAL_COMMUNITY)
Admission: EM | Admit: 2011-07-30 | Discharge: 2011-07-30 | Disposition: A | Discharge status: Home / Self Care | Payer: BC Managed Care – PPO | Attending: Emergency Medicine | Admitting: Emergency Medicine

## 2011-07-30 ENCOUNTER — Telehealth: Payer: Self-pay | Admitting: *Deleted

## 2011-07-30 ENCOUNTER — Encounter (HOSPITAL_COMMUNITY): Payer: Self-pay | Admitting: Emergency Medicine

## 2011-07-30 DIAGNOSIS — M25559 Pain in unspecified hip: Secondary | ICD-10-CM | POA: Insufficient documentation

## 2011-07-30 DIAGNOSIS — M549 Dorsalgia, unspecified: Secondary | ICD-10-CM | POA: Insufficient documentation

## 2011-07-30 DIAGNOSIS — Z9109 Other allergy status, other than to drugs and biological substances: Secondary | ICD-10-CM | POA: Insufficient documentation

## 2011-07-30 DIAGNOSIS — N3090 Cystitis, unspecified without hematuria: Secondary | ICD-10-CM | POA: Insufficient documentation

## 2011-07-30 DIAGNOSIS — E669 Obesity, unspecified: Secondary | ICD-10-CM | POA: Insufficient documentation

## 2011-07-30 DIAGNOSIS — E785 Hyperlipidemia, unspecified: Secondary | ICD-10-CM | POA: Insufficient documentation

## 2011-07-30 DIAGNOSIS — Z8739 Personal history of other diseases of the musculoskeletal system and connective tissue: Secondary | ICD-10-CM | POA: Insufficient documentation

## 2011-07-30 DIAGNOSIS — M25552 Pain in left hip: Secondary | ICD-10-CM

## 2011-07-30 DIAGNOSIS — Z7982 Long term (current) use of aspirin: Secondary | ICD-10-CM | POA: Insufficient documentation

## 2011-07-30 LAB — URINALYSIS, ROUTINE W REFLEX MICROSCOPIC
Glucose, UA: NEGATIVE mg/dL
Leukocytes, UA: NEGATIVE
Specific Gravity, Urine: 1.02 (ref 1.005–1.030)
Urobilinogen, UA: 0.2 mg/dL (ref 0.0–1.0)

## 2011-07-30 LAB — COMPREHENSIVE METABOLIC PANEL
ALT: 12 U/L (ref 0–35)
AST: 17 U/L (ref 0–37)
Alkaline Phosphatase: 106 U/L (ref 39–117)
CO2: 27 mEq/L (ref 19–32)
Calcium: 10 mg/dL (ref 8.4–10.5)
Chloride: 101 mEq/L (ref 96–112)
GFR calc Af Amer: 60 mL/min — ABNORMAL LOW (ref >90)
GFR calc non Af Amer: 51 mL/min — ABNORMAL LOW (ref >90)
Glucose, Bld: 85 mg/dL (ref 70–99)
Sodium: 139 mEq/L (ref 135–145)
Total Bilirubin: 0.3 mg/dL (ref 0.3–1.2)

## 2011-07-30 LAB — CBC WITH DIFFERENTIAL/PLATELET
Basophils Absolute: 0 10*3/uL (ref 0.0–0.1)
Eosinophils Relative: 1 % (ref 0–5)
HCT: 41.4 % (ref 36.0–46.0)
Lymphocytes Relative: 43 % (ref 12–46)
Lymphs Abs: 3.8 10*3/uL (ref 0.7–4.0)
MCV: 88.5 fL (ref 78.0–100.0)
Neutro Abs: 4.4 10*3/uL (ref 1.7–7.7)
Platelets: 249 10*3/uL (ref 150–400)
RBC: 4.68 MIL/uL (ref 3.87–5.11)
RDW: 13.6 % (ref 11.5–15.5)
WBC: 8.8 10*3/uL (ref 4.0–10.5)

## 2011-07-30 MED ORDER — TRAMADOL HCL 50 MG PO TABS
50.0000 mg | ORAL_TABLET | Freq: Once | ORAL | Status: AC
  Administered 2011-07-30: 50 mg via ORAL
  Filled 2011-07-30: qty 1

## 2011-07-30 MED ORDER — TRAMADOL HCL 50 MG PO TABS: 50.0000 mg | ORAL_TABLET | Freq: Four times a day (QID) | ORAL | Status: AC | PRN

## 2011-07-30 NOTE — Telephone Encounter (Signed)
Please advise 

## 2011-07-30 NOTE — Telephone Encounter (Signed)
Pt advised that she was in office with MD Drue Novel on Monday and was referred to GYN by which she has an apt this Friday however pt now advising that she is hurting worse than when she was in the office, notes pain is radiating down her left hip now as well as still in her abdomin and back,  pain range noted at 10 out of 10 range, no relief with any position changes, hurts in all positions, advised she will probably need to go to the ER however the MD will need to advise, please advise

## 2011-07-30 NOTE — Telephone Encounter (Signed)
Discussed with pt

## 2011-07-30 NOTE — ED Notes (Signed)
Pt presenting to ed with c/o abdominal pain that radiates into her back. Pt denies nausea, vomiting and diarrhea. Pt states pain is also radiating into her left hip. Pt state pain is causing her difficulty with lifting  her leg up. Pt states her pcp told her to present to ed.

## 2011-07-30 NOTE — Telephone Encounter (Signed)
Needs to go to the ER.

## 2011-07-30 NOTE — ED Provider Notes (Signed)
History     CSN: [id number]  Arrival date & time 07/30/11  1807   First MD Initiated Contact with Patient 07/30/11 2009      Chief Complaint  Patient presents with  . Abdominal Pain  . Back Pain     HPI  The patient presents with concerns of ongoing left hip pain.  She notes that the pain has been present for weeks with no antecedent trauma, fall, according events.  Over the past several weeks she has seen her rheumatologist, had two injections for pain control and notes that her hip pain was tolerable.  Over the last days she's had a particular worsening of her pain.  She notes that the pain is sharp, focally about the anterior superior iliac crest with radiation on the anterior aspect of her thigh inferiorly.  The pain also radiates towards her abdomen and back.  The pain is most present with leg motion, minimally present at rest.  Pain is improved minimally with tramadol and acetaminophen. She denies any concurrent nausea, vomiting, diarrhea, anorexia, dysuria, urinary frequency.  Past Medical History  Diagnosis Date  . Fructose intolerance   . Arthritis   . Seasonal allergies   . Restless legs   . Depression   . History of chicken pox   . Hyperlipidemia   . Heart murmur     Past Surgical History  Procedure Date  . Knee surgery 04/2009    right knee, meniscus tear  . Wrist surgery 1980s    cyst removed from right wrist  . Abdominal hysterectomy 1995  . Other surgical history 1995?    removed scar tissue from colon    Family History  Problem Relation Age of Onset  . Diabetes Mother   . Hyperlipidemia Mother   . Hypertension Mother   . Heart attack Mother   . Diabetes Sister   . Hyperlipidemia Sister   . Hypertension Sister   . Diabetes Brother   . Hyperlipidemia Brother   . Hypertension Brother   . Heart attack Brother   . Diabetes Maternal Grandmother   . Hyperlipidemia Maternal Grandmother   . Hypertension Maternal Grandmother   . Heart disease Father     . Leukemia Father     History  Substance Use Topics  . Smoking status: Never Smoker   . Smokeless tobacco: Never Used  . Alcohol Use: No    OB History    Grav Para Term Preterm Abortions TAB SAB Ect Mult Living                  Review of Systems  Constitutional:       HPI  HENT:       HPI otherwise negative  Eyes: Negative.   Respiratory:       HPI, otherwise negative  Cardiovascular:       HPI, otherwise nmegative  Gastrointestinal: Negative for vomiting.  Genitourinary:       HPI, otherwise negative  Musculoskeletal:       HPI, otherwise negative  Skin: Negative.   Neurological: Negative for syncope.    Allergies  Codeine; Erythromycin; Penicillins; Sulfa antibiotics; and Tetracyclines & related  Home Medications   Current Outpatient Rx  Name Route Sig Dispense Refill  . ASPIRIN-ACETAMINOPHEN-CAFFEINE 250-250-65 MG PO TABS Oral Take 1 tablet by mouth every 6 (six) hours as needed. headache    . B COMPLEX VITAMINS PO CAPS Oral Take 1 capsule by mouth daily.      . BUPROPION HCL  75 MG PO TABS Oral Take 1 tablet (75 mg total) by mouth 2 (two) times daily. 60 tablet 1  . CALCIUM CARBONATE ANTACID 500 MG PO CHEW Oral Chew 2 tablets by mouth daily.      . OMEGA-3 FATTY ACIDS 1000 MG PO CAPS Oral Take 1,000 mg by mouth daily.      Marland Kitchen GLUCOSAMINE 500 MG PO CAPS Oral Take 1 capsule by mouth.      Marland Kitchen HYDROCHLOROTHIAZIDE 12.5 MG PO TABS Oral Take 12.5 mg by mouth daily.    Marland Kitchen LORATADINE 10 MG PO TABS Oral Take 10 mg by mouth daily.    . MELOXICAM 15 MG PO TABS Oral Take 15 mg by mouth daily.    . MULTI-VITAMIN/MINERALS PO TABS Oral Take 1 tablet by mouth daily.      Marland Kitchen PANTOPRAZOLE SODIUM 40 MG PO TBEC Oral Take 40 mg by mouth daily.      Marland Kitchen ALIGN PO Oral Take 1 capsule by mouth.      . TRAMADOL HCL 50 MG PO TABS Oral Take 50 mg by mouth every 6 (six) hours as needed. pain    . VITAMIN D (CHOLECALCIFEROL) PO Oral Take 2,000 Units by mouth.        BP 142/82  Pulse 86   Temp 98.4 F (36.9 C) (Oral)  Resp 20  SpO2 100%  Physical Exam  Nursing note and vitals reviewed. Constitutional: She is oriented to person, place, and time. She appears well-developed and well-nourished. No distress.       Obese female resting in bed  HENT:  Head: Normocephalic and atraumatic.  Eyes: Conjunctivae and EOM are normal.  Cardiovascular: Normal rate and regular rhythm.   Pulmonary/Chest: Effort normal and breath sounds normal. No stridor. No respiratory distress.  Abdominal: She exhibits no distension. There is no tenderness.    Musculoskeletal: She exhibits no edema.       There is minimal discomfort with passive motion of the left leg.  When the patient attempts to flex her hip she has incapacitating pain and cannot do so.  There is tenderness to palpation about the anterior superior iliac crest and just medial. The remainder of the musculoskeletal exam is unremarkable, with appropriate distal pulses and neurovascular status  Neurological: She is alert and oriented to person, place, and time. No cranial nerve deficit.  Skin: Skin is warm and dry.  Psychiatric: She has a normal mood and affect.    ED Course  Procedures (including critical care time)  Labs Reviewed  COMPREHENSIVE METABOLIC PANEL - Abnormal; Notable for the following:    Potassium 3.3 (*)     BUN 25 (*)     Creatinine, Ser 1.17 (*)     GFR calc non Af Amer 51 (*)     GFR calc Af Amer 60 (*)     All other components within normal limits  CBC WITH DIFFERENTIAL  URINALYSIS, ROUTINE W REFLEX MICROSCOPIC   Dg Hip Complete Left  07/30/2011  *RADIOLOGY REPORT*  Clinical Data: Left hip pain.  LEFT HIP - COMPLETE 2+ VIEW  Comparison: None.  Findings: No acute fracture or dislocation identified.  No significant degenerative changes in the hip joint.  No bony lesions or destruction.  The rest of the visualized bony pelvis is unremarkable. There are visible degenerative changes in the lower lumbar spine at  the L5-S1 level.  There also is asymmetric assimilation of the right transverse process of L5 with the upper sacrum.  IMPRESSION:  Unremarkable appearance of the left hip.  Degenerative changes visible in the lower lumbar spine.  Original Report Authenticated By: Reola Calkins, M.D.     No diagnosis found.    MDM  This patient presents with ongoing left hip and abdominal pain.  Given the physical exam finding of painless passive motion, but pain with attempts to flex the hip there suspicion for musculoskeletal etiology, though with her abdominal pain, and hematuria, nephrolithiasis was a consideration.  The patient's labs and regular findings do not demonstrate nephrolithiasis, but there is evidence of cystitis.  No other clear findings related to the hip are noted.  I spent a considerable amount time discussing cystitis, and need for continued orthopedic followup with the patient.  She was not provided antibiotics, absent gross evidence of UTI, and with the patient's endorsement of prior cystitis-like episodes and he questionable family history of renal/bladder disease.  She will follow up as soon as possible with urology and orthopedics.        Gerhard Munch, MD 07/30/11 2358

## 2011-07-30 NOTE — ED Notes (Signed)
Patient discharge via ambulatory with steady gait. Respirations equal and unlabored. Skin warm and dry. No acute distress noted. 

## 2011-08-01 LAB — URINE CULTURE: Culture: NO GROWTH

## 2011-08-04 ENCOUNTER — Other Ambulatory Visit: Payer: Self-pay | Admitting: Orthopaedic Surgery

## 2011-08-04 DIAGNOSIS — M79606 Pain in leg, unspecified: Secondary | ICD-10-CM

## 2011-08-04 DIAGNOSIS — M25559 Pain in unspecified hip: Secondary | ICD-10-CM

## 2011-08-04 DIAGNOSIS — M545 Low back pain: Secondary | ICD-10-CM

## 2011-08-08 ENCOUNTER — Other Ambulatory Visit: Payer: BC Managed Care – PPO

## 2011-08-08 ENCOUNTER — Ambulatory Visit
Admission: RE | Admit: 2011-08-08 | Discharge: 2011-08-08 | Disposition: A | Discharge status: Home / Self Care | Payer: BC Managed Care – PPO | Source: Ambulatory Visit | Attending: Orthopaedic Surgery | Admitting: Orthopaedic Surgery

## 2011-08-08 DIAGNOSIS — M79606 Pain in leg, unspecified: Secondary | ICD-10-CM

## 2011-08-08 DIAGNOSIS — M545 Low back pain: Secondary | ICD-10-CM

## 2011-08-08 DIAGNOSIS — M25559 Pain in unspecified hip: Secondary | ICD-10-CM

## 2011-08-11 ENCOUNTER — Telehealth: Payer: Self-pay | Admitting: Internal Medicine

## 2011-08-11 NOTE — Telephone Encounter (Signed)
Records reviewed Negative mammogram 03-2010 Sleep study 5 - 2011, mild sleep apnea A number of old office visit notes will be scanned for future reference

## 2011-08-24 ENCOUNTER — Other Ambulatory Visit: Payer: Self-pay | Admitting: Family

## 2011-09-26 ENCOUNTER — Encounter: Payer: Self-pay | Admitting: Internal Medicine

## 2011-10-14 ENCOUNTER — Other Ambulatory Visit: Payer: Self-pay

## 2011-10-14 MED ORDER — BUPROPION HCL 75 MG PO TABS: 75.0000 mg | ORAL_TABLET | Freq: Two times a day (BID) | ORAL | Status: DC

## 2011-10-14 NOTE — Telephone Encounter (Signed)
Refill done.  

## 2011-10-14 NOTE — Telephone Encounter (Signed)
Next appt scheduled10/23/13. Last filled 05/30/11 #60 x1.   Need okay Plz advise         MW

## 2011-10-29 ENCOUNTER — Ambulatory Visit: Payer: BC Managed Care – PPO | Admitting: Internal Medicine

## 2011-10-29 DIAGNOSIS — Z0289 Encounter for other administrative examinations: Secondary | ICD-10-CM

## 2011-11-07 ENCOUNTER — Telehealth: Payer: Self-pay | Admitting: Internal Medicine

## 2011-11-07 NOTE — Telephone Encounter (Signed)
If sx severe, redness, fever-- go to a UC Otherwise needs OV next week

## 2011-11-07 NOTE — Telephone Encounter (Signed)
Discussed with pt. Denies any severe symptoms. Pt is coming in Monday @ 3pm.

## 2011-11-07 NOTE — Telephone Encounter (Signed)
Patient calling, having right wrist pain when she uses it to lift anything.  No known injury.  No numbness or tingling in the hand/fingers.  No bruising or discoloration.   Triaged per Arm, Non-injured protocal.  Needs to be seen in 24 hours.  No appts available this afternoon.   MD of the afternoon, booked as well as Ms Lendell Caprice and Ms Orvan Falconer are not available.  Please advise if patient can be seen as a workin?

## 2011-11-10 ENCOUNTER — Encounter: Payer: Self-pay | Admitting: *Deleted

## 2011-11-10 ENCOUNTER — Encounter: Payer: Self-pay | Admitting: Internal Medicine

## 2011-11-10 ENCOUNTER — Ambulatory Visit (INDEPENDENT_AMBULATORY_CARE_PROVIDER_SITE_OTHER): Payer: BC Managed Care – PPO | Admitting: Internal Medicine

## 2011-11-10 VITALS — BP 118/72 | HR 86 | Temp 98.4°F | Wt 261.0 lb

## 2011-11-10 DIAGNOSIS — M2550 Pain in unspecified joint: Secondary | ICD-10-CM

## 2011-11-10 NOTE — Assessment & Plan Note (Signed)
Pain in multiple joints in different locations. Etiology not clear, few years ago rheumatology did extensive evaluation for aches, pains, rash and mouth blisters, at that time the evaluation was negative. She presents today with aches and pains in different places but with no evidence of synovitis. DX include of DJD, fibromyalgia, etc; autoimmune diseases are less likely. Patient is counseled, I understand her frustration however I can't come up with a diagnosis other than possibly DJD-FM. we agree to refer her to Dr. Kellie Simmering for re-evaluation

## 2011-11-10 NOTE — Progress Notes (Signed)
  Subjective:    Patient ID: Taylor Silva, female    DOB: Aug 10, 1955, 56 y.o.   MRN: [id number]  HPI Acute visit Last week she had pains in the hip,  back and eventually her right wrist. Denies any swelling redness or warmness in the wrist. She is frustrated because she continued having aches and pains in different places.   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 No recent rash Denies any bruises No recent injuries in her wrists    Objective:   Physical Exam  General -- alert, well-developed, and overweight appearing. No apparent distress.  Extremities-- no pretibial edema bilaterally . Hands and wrists without evidence of synovitis. Knows she can is nodules. Neurologic-- alert & oriented X3 and strength normal in all extremities. Psych-- Cognition and judgment appear intact. Alert and cooperative with normal attention span and concentration.   Very emotional, frustrated.     Assessment & Plan:

## 2011-11-10 NOTE — Patient Instructions (Addendum)
We are referring you to Dr. Kellie Simmering, if you don't hear from [place] within 4 or 5 days please let us know

## 2011-11-30 ENCOUNTER — Emergency Department (HOSPITAL_BASED_OUTPATIENT_CLINIC_OR_DEPARTMENT_OTHER): Payer: No Typology Code available for payment source

## 2011-11-30 ENCOUNTER — Emergency Department (HOSPITAL_BASED_OUTPATIENT_CLINIC_OR_DEPARTMENT_OTHER)
Admission: EM | Admit: 2011-11-30 | Discharge: 2011-11-30 | Disposition: A | Discharge status: Home / Self Care | Payer: No Typology Code available for payment source | Attending: Emergency Medicine | Admitting: Emergency Medicine

## 2011-11-30 ENCOUNTER — Encounter (HOSPITAL_BASED_OUTPATIENT_CLINIC_OR_DEPARTMENT_OTHER): Payer: Self-pay

## 2011-11-30 DIAGNOSIS — F3289 Other specified depressive episodes: Secondary | ICD-10-CM | POA: Insufficient documentation

## 2011-11-30 DIAGNOSIS — E7412 Hereditary fructose intolerance: Secondary | ICD-10-CM | POA: Insufficient documentation

## 2011-11-30 DIAGNOSIS — E785 Hyperlipidemia, unspecified: Secondary | ICD-10-CM | POA: Insufficient documentation

## 2011-11-30 DIAGNOSIS — Y9389 Activity, other specified: Secondary | ICD-10-CM | POA: Insufficient documentation

## 2011-11-30 DIAGNOSIS — W010XXA Fall on same level from slipping, tripping and stumbling without subsequent striking against object, initial encounter: Secondary | ICD-10-CM | POA: Insufficient documentation

## 2011-11-30 DIAGNOSIS — S93402A Sprain of unspecified ligament of left ankle, initial encounter: Secondary | ICD-10-CM

## 2011-11-30 DIAGNOSIS — F329 Major depressive disorder, single episode, unspecified: Secondary | ICD-10-CM | POA: Insufficient documentation

## 2011-11-30 DIAGNOSIS — Z8619 Personal history of other infectious and parasitic diseases: Secondary | ICD-10-CM | POA: Insufficient documentation

## 2011-11-30 DIAGNOSIS — S5000XA Contusion of unspecified elbow, initial encounter: Secondary | ICD-10-CM | POA: Insufficient documentation

## 2011-11-30 DIAGNOSIS — S93409A Sprain of unspecified ligament of unspecified ankle, initial encounter: Secondary | ICD-10-CM | POA: Insufficient documentation

## 2011-11-30 DIAGNOSIS — Y9289 Other specified places as the place of occurrence of the external cause: Secondary | ICD-10-CM | POA: Insufficient documentation

## 2011-11-30 DIAGNOSIS — S5001XA Contusion of right elbow, initial encounter: Secondary | ICD-10-CM

## 2011-11-30 DIAGNOSIS — Z79899 Other long term (current) drug therapy: Secondary | ICD-10-CM | POA: Insufficient documentation

## 2011-11-30 DIAGNOSIS — G2581 Restless legs syndrome: Secondary | ICD-10-CM | POA: Insufficient documentation

## 2011-11-30 DIAGNOSIS — J309 Allergic rhinitis, unspecified: Secondary | ICD-10-CM | POA: Insufficient documentation

## 2011-11-30 DIAGNOSIS — M119 Crystal arthropathy, unspecified: Secondary | ICD-10-CM | POA: Insufficient documentation

## 2011-11-30 DIAGNOSIS — S139XXA Sprain of joints and ligaments of unspecified parts of neck, initial encounter: Secondary | ICD-10-CM

## 2011-11-30 DIAGNOSIS — Z7982 Long term (current) use of aspirin: Secondary | ICD-10-CM | POA: Insufficient documentation

## 2011-11-30 MED ORDER — OXYCODONE-ACETAMINOPHEN 5-325 MG PO TABS
1.0000 | ORAL_TABLET | Freq: Once | ORAL | Status: AC
  Administered 2011-11-30: 1 via ORAL
  Filled 2011-11-30 (×2): qty 1

## 2011-11-30 MED ORDER — DIAZEPAM 5 MG PO TABS: 5.0000 mg | ORAL_TABLET | Freq: Two times a day (BID) | ORAL | Status: DC

## 2011-11-30 MED ORDER — HYDROCODONE-ACETAMINOPHEN 5-325 MG PO TABS: 1.0000 | ORAL_TABLET | ORAL | Status: DC | PRN

## 2011-11-30 NOTE — ED Notes (Signed)
Patient arrived by EMS after falling at [employer] this evening, reports that she slipped on soap. No loc. Patient complains of right shoulder, elbow, left hip and left ankle.

## 2011-11-30 NOTE — ED Notes (Signed)
Crackers and sprite given prior to percocet administration.

## 2011-11-30 NOTE — ED Provider Notes (Signed)
History     CSN: [id number]  Arrival date & time 11/30/11  4098   First MD Initiated Contact with Patient 11/30/11 2006      Chief Complaint  Patient presents with  . Fall    (Consider location/radiation/quality/duration/timing/severity/associated sxs/prior treatment) HPI History provided by pt.   Pt slipped on soapy water and fell at [facility], just pta.  Denies having dizziness, lightheadedness, CP, SOB or palpitations prior to fall.  Is not sure of how she landed but did not hit her head.  C/o mild pain in neck, severe pain in low back, as well as pain in R shoulder/elbow/elbow and L hip/knee/ankle.  Has a hematoma to R elbow.  Is able to bear weight on LLE.  No associated extremity weakness/paresthesias.  Is not anti-coagulated.  Past Medical History  Diagnosis Date  . Fructose intolerance   . Arthritis   . Seasonal allergies   . Restless legs   . Depression   . History of chicken pox   . Hyperlipidemia   . Heart murmur     Past Surgical History  Procedure Date  . Knee surgery 04/2009    right knee, meniscus tear  . Wrist surgery 1980s    cyst removed from right wrist  . Abdominal hysterectomy 1995  . Other surgical history 1995?    removed scar tissue from colon    Family History  Problem Relation Age of Onset  . Diabetes Mother   . Hyperlipidemia Mother   . Hypertension Mother   . Heart attack Mother   . Diabetes Sister   . Hyperlipidemia Sister   . Hypertension Sister   . Diabetes Brother   . Hyperlipidemia Brother   . Hypertension Brother   . Heart attack Brother   . Diabetes Maternal Grandmother   . Hyperlipidemia Maternal Grandmother   . Hypertension Maternal Grandmother   . Heart disease Father   . Leukemia Father     History  Substance Use Topics  . Smoking status: Never Smoker   . Smokeless tobacco: Never Used  . Alcohol Use: No    OB History    Grav Para Term Preterm Abortions TAB SAB Ect Mult Living                  Review of  Systems  All other systems reviewed and are negative.    Allergies  Codeine; Erythromycin; Penicillins; Sulfa antibiotics; and Tetracyclines & related  Home Medications   Current Outpatient Rx  Name  Route  Sig  Dispense  Refill  . ASPIRIN-ACETAMINOPHEN-CAFFEINE 250-250-65 MG PO TABS   Oral   Take 1 tablet by mouth every 6 (six) hours as needed. headache         . B COMPLEX VITAMINS PO CAPS   Oral   Take 1 capsule by mouth daily.           . BUPROPION HCL 75 MG PO TABS   Oral   Take 1 tablet (75 mg total) by mouth 2 (two) times daily.   60 tablet   0   . CALCIUM CARBONATE ANTACID 500 MG PO CHEW   Oral   Chew 2 tablets by mouth daily.           . OMEGA-3 FATTY ACIDS 1000 MG PO CAPS   Oral   Take 1,000 mg by mouth daily.           Marland Kitchen GLUCOSAMINE 500 MG PO CAPS   Oral   Take 1 capsule  by mouth.           Marland Kitchen HYDROCHLOROTHIAZIDE 12.5 MG PO TABS   Oral   Take 12.5 mg by mouth daily.         Marland Kitchen LORATADINE 10 MG PO TABS   Oral   Take 10 mg by mouth daily.         . MELOXICAM 15 MG PO TABS   Oral   Take 15 mg by mouth daily.         . MULTI-VITAMIN/MINERALS PO TABS   Oral   Take 1 tablet by mouth daily.           Marland Kitchen PANTOPRAZOLE SODIUM 40 MG PO TBEC   Oral   Take 40 mg by mouth daily.           Marland Kitchen ALIGN PO   Oral   Take 1 capsule by mouth.           Marland Kitchen VITAMIN D (CHOLECALCIFEROL) PO   Oral   Take 2,000 Units by mouth.             BP 146/111  Pulse 90  Temp 98 F (36.7 C) (Oral)  Resp 18  Ht 5\' 6"  (1.676 m)  Wt 250 lb (113.399 kg)  BMI 40.35 kg/m2  SpO2 100%  Physical Exam  Nursing note and vitals reviewed. Constitutional: She is oriented to person, place, and time. She appears well-developed and well-nourished. No distress.  HENT:  Head: Normocephalic and atraumatic.  Eyes:       Normal appearance  Neck: Normal range of motion.  Pulmonary/Chest: Effort normal.  Musculoskeletal: Normal range of motion.       Mild  tenderness cervical spine and mid-line pain w/ head rotations.  Mod tenderness lumbar spine.  No deformities of any joints of RUE or LLE.  Hematoma posterior right elbow, ecchymosis just inferior to left anterior knee and edema left lateral malleolus.  Shoulder/elbow non-tender.  Mild tenderness distal ulna.  Full ROM all 3 joints w/ minimal pain.  Tenderness left lateral hip and lateral malleolus.  Hip pain w/ flexion but not w/ internal/external rotation.  No pain w/ ROM of knee.  Pain in ankle w/ medial/lateral rotation and foot inversion/eversion.  2+ DP pulse and distal sensation intact.      Neurological: She is alert and oriented to person, place, and time.  Psychiatric: She has a normal mood and affect. Her behavior is normal.    ED Course  Procedures (including critical care time)  Labs Reviewed - No data to display Dg Cervical Spine Complete  11/30/2011  *RADIOLOGY REPORT*  Clinical Data: History of trauma from a fall.  Neck pain.  CERVICAL SPINE - COMPLETE 4+ VIEW  Comparison: No priors.  Findings: Six views of the cervical spine demonstrate no definite acute displaced fractures of the cervical spine.  Alignment is anatomic.  Prevertebral soft tissues are normal.  IMPRESSION: 1.  No acute radiographic abnormality of the cervical spine.   Original Report Authenticated By: Trudie Reed, M.D.    Dg Lumbar Spine Complete  11/30/2011  *RADIOLOGY REPORT*  Clinical Data: History of trauma from a fall.  LUMBAR SPINE - COMPLETE 4+ VIEW  Comparison: MRI of the lumbar spine 08/08/2011.  Findings: Five views of the lumbar spine demonstrate no acute displaced fractures or definite compression type fractures.  There is multilevel degenerative disc disease and facet arthropathy, most severe at L4-L5 and L5-S1.  At L4-L5 there is a 5 mm of anterolisthesis  of L4 upon L5.  Alignment is otherwise anatomic. No defects of the pars interarticularis are noted.  IMPRESSION: 1.  No acute radiographic abnormality  of the lumbar spine. 2.  Multilevel degenerative disc disease and lumbar spondylosis, as above, most severe at L4-L5 where there is 5 mm of anterolisthesis of L4 upon L5.   Original Report Authenticated By: Trudie Reed, M.D.    Dg Hip Complete Left  11/30/2011  *RADIOLOGY REPORT*  Clinical Data: History of fall complaining of left hip pain.  LEFT HIP - COMPLETE 2+ VIEW  Comparison: No priors.  Findings: AP view of the pelvis and AP and lateral views of the left hip demonstrate no acute displaced fracture, subluxation, dislocation, joint or soft tissue abnormality.  IMPRESSION: 1.  No acute radiographic abnormality of the bony pelvis or the left hip.   Original Report Authenticated By: Trudie Reed, M.D.    Dg Ankle Complete Left  11/30/2011  *RADIOLOGY REPORT*  Clinical Data: Left ankle pain.  LEFT ANKLE COMPLETE - 3+ VIEW  Comparison: No priors.  Findings: Three views of the left ankle demonstrates soft tissue swelling overlying the lateral malleolus.  No acute displaced fracture, subluxation or dislocation is noted.  Small plantar calcaneal spur incidentally noted.  IMPRESSION: 1.  Soft tissue swelling overlying the lateral malleolus. 2.  No acute bony trauma.   Original Report Authenticated By: Trudie Reed, M.D.      1. Sprain of left ankle   2. Cervical sprain   3. Contusion of right elbow       MDM  56yo F had a mechanical fall at store this evening and presents w/ multiple joint pains.  Did not hit head.  Xrays of cervical/lumbar spine as well as L hip and ankle are pending.  Pt declines pain medication at this time.   Xrays neg for acute pathology.  Degenerative changes of lumbar spine discussed.  Nursing staff provided pt with a cam walker for L ankle sprain, pt received one percocet and d/c'd home w/ vicodin and valium.  She can not take NSAIDs d/t h/o NSAID-induced renal insufficiency.  Recommended rest, ice and elevation of painful joints.  She has an orthopedist to f/u  with. Return precautions discussed. 10:00 PM         Otilio Miu, [place] 11/30/11 2201

## 2011-12-01 ENCOUNTER — Telehealth: Payer: Self-pay | Admitting: Internal Medicine

## 2011-12-01 MED ORDER — BUPROPION HCL 75 MG PO TABS: 75.0000 mg | ORAL_TABLET | Freq: Two times a day (BID) | ORAL | Status: DC

## 2011-12-01 NOTE — ED Provider Notes (Signed)
Medical screening examination/treatment/procedure(s) were performed by non-physician practitioner and as supervising physician I was immediately available for consultation/collaboration.   Aydon Swamy W. Quintavia Rogstad, MD 12/01/11 1122 

## 2011-12-01 NOTE — Telephone Encounter (Signed)
Patient states we were supposed to call in her bupropion when she was here for her last visit. She is out and is requesting a refill ASAP. She uses [facility] [address].

## 2011-12-01 NOTE — Telephone Encounter (Signed)
Refill done.  

## 2011-12-01 NOTE — Telephone Encounter (Signed)
Ok to refill 

## 2011-12-01 NOTE — Telephone Encounter (Signed)
Ok RF x 6 months  

## 2012-01-08 ENCOUNTER — Other Ambulatory Visit: Payer: Self-pay | Admitting: *Deleted

## 2012-01-08 NOTE — Telephone Encounter (Signed)
Last OV 11-10-11, no history of refill ok to refill

## 2012-01-09 MED ORDER — HYDROCHLOROTHIAZIDE 12.5 MG PO TABS: 12.5000 mg | ORAL_TABLET | Freq: Every day | ORAL | Status: DC

## 2012-01-09 NOTE — Telephone Encounter (Signed)
Discuss with patient, Rx sent. 

## 2012-01-09 NOTE — Telephone Encounter (Signed)
Call patient, call # [phone] and one refill. She needs a ROV, so far I have only seen her for acute issues

## 2012-01-28 ENCOUNTER — Other Ambulatory Visit: Payer: Self-pay | Admitting: Orthopaedic Surgery

## 2012-01-28 DIAGNOSIS — M4310 Spondylolisthesis, site unspecified: Secondary | ICD-10-CM

## 2012-02-06 ENCOUNTER — Ambulatory Visit
Admission: RE | Admit: 2012-02-06 | Discharge: 2012-02-06 | Disposition: A | Discharge status: Home / Self Care | Payer: BC Managed Care – PPO | Source: Ambulatory Visit | Attending: Orthopaedic Surgery | Admitting: Orthopaedic Surgery

## 2012-02-06 DIAGNOSIS — M4310 Spondylolisthesis, site unspecified: Secondary | ICD-10-CM

## 2012-02-09 ENCOUNTER — Ambulatory Visit
Admission: RE | Admit: 2012-02-09 | Discharge: 2012-02-09 | Disposition: A | Discharge status: Home / Self Care | Payer: Self-pay | Source: Ambulatory Visit | Attending: Orthopaedic Surgery | Admitting: Orthopaedic Surgery

## 2012-02-09 VITALS — BP 113/53 | HR 74

## 2012-02-09 DIAGNOSIS — M549 Dorsalgia, unspecified: Secondary | ICD-10-CM

## 2012-02-09 MED ORDER — IOHEXOL 180 MG/ML  SOLN
18.0000 mL | Freq: Once | INTRAMUSCULAR | Status: AC | PRN
  Administered 2012-02-09: 18 mL via INTRATHECAL

## 2012-02-09 MED ORDER — ONDANSETRON HCL 4 MG/2ML IJ SOLN: 4.0000 mg | Freq: Four times a day (QID) | INTRAMUSCULAR | Status: DC | PRN

## 2012-02-09 MED ORDER — DIAZEPAM 5 MG PO TABS
10.0000 mg | ORAL_TABLET | Freq: Once | ORAL | Status: AC
  Administered 2012-02-09: 10 mg via ORAL

## 2012-02-09 NOTE — Progress Notes (Signed)
Pt has been off tramadol and welbutrin since last week.

## 2012-04-16 ENCOUNTER — Other Ambulatory Visit: Payer: Self-pay | Admitting: Internal Medicine

## 2012-04-16 ENCOUNTER — Encounter: Payer: Self-pay | Admitting: *Deleted

## 2012-04-16 NOTE — Telephone Encounter (Signed)
Refill done.  Letter mailed to pt to let her know she is due for an OV.

## 2012-05-29 ENCOUNTER — Other Ambulatory Visit: Payer: Self-pay | Admitting: Internal Medicine

## 2012-06-01 ENCOUNTER — Encounter: Payer: Self-pay | Admitting: *Deleted

## 2012-06-01 NOTE — Telephone Encounter (Signed)
Last filled 04-16-12 #30, last OV 11-10-11, letter mailed at last refill advising OV due, no pending OV scheduled

## 2012-06-01 NOTE — Telephone Encounter (Deleted)
Rx sent, Letter Mail  

## 2012-06-02 NOTE — Telephone Encounter (Signed)
Spoke to pt, scheduled appt 6.10.14 @ 3pm.  Refill done.

## 2012-06-02 NOTE — Telephone Encounter (Signed)
Schedule a routine office visit within 3 weeks. Ok  #21, no refills

## 2012-06-15 ENCOUNTER — Ambulatory Visit (INDEPENDENT_AMBULATORY_CARE_PROVIDER_SITE_OTHER): Payer: Self-pay | Admitting: Internal Medicine

## 2012-06-15 VITALS — BP 116/82 | HR 89 | Temp 98.4°F | Ht 65.5 in | Wt 266.0 lb

## 2012-06-15 DIAGNOSIS — F32A Depression, unspecified: Secondary | ICD-10-CM

## 2012-06-15 DIAGNOSIS — F329 Major depressive disorder, single episode, unspecified: Secondary | ICD-10-CM

## 2012-06-15 DIAGNOSIS — Z0000 Encounter for general adult medical examination without abnormal findings: Secondary | ICD-10-CM

## 2012-06-15 DIAGNOSIS — G2589 Other specified extrapyramidal and movement disorders: Secondary | ICD-10-CM

## 2012-06-15 DIAGNOSIS — Z23 Encounter for immunization: Secondary | ICD-10-CM

## 2012-06-15 DIAGNOSIS — M549 Dorsalgia, unspecified: Secondary | ICD-10-CM

## 2012-06-15 DIAGNOSIS — Z2911 Encounter for prophylactic immunotherapy for respiratory syncytial virus (RSV): Secondary | ICD-10-CM

## 2012-06-15 DIAGNOSIS — G2581 Restless legs syndrome: Secondary | ICD-10-CM

## 2012-06-15 DIAGNOSIS — Z1239 Encounter for other screening for malignant neoplasm of breast: Secondary | ICD-10-CM | POA: Insufficient documentation

## 2012-06-15 NOTE — Assessment & Plan Note (Signed)
See previous entry, she's not taking Wellbutrin twice a day and again symptoms are not completely well. Not suicidal. She agree to see psych

## 2012-06-15 NOTE — Patient Instructions (Signed)
Please come back fasting: FLP, CMP, CBC, TSH, vitamin D--- dx v70 Think about a shot called Zostavax --- Next visit 1-2 months to discuss bariatric surgery

## 2012-06-15 NOTE — Assessment & Plan Note (Addendum)
Ongoing issue for the patient, had a myelogram 01-2012. Has seen two specialist, one recommended surgery the other conservative treatment. Was prescribed gabapentin and takes ultram at night. Of note  , Mobic was discontinued because a "kidney problem"

## 2012-06-15 NOTE — Assessment & Plan Note (Addendum)
Tdap 2010  Reports a colonoscopy in 2008 with Dr. Randa Evens, will request records. (records reviewed, next 2018) Saw gynecology  08-2011 Negative mammogram 09-2011 She has a positive family history of heart disease, asymptomatic, EKG today:NSR Labs Diet exercise discussed, somewhat reluctant to see a nutritionist, exploring the possibility or bariatric surgery Order DEXA, never had one

## 2012-06-15 NOTE — Assessment & Plan Note (Signed)
Today she reports a history of RLS on requip, helps to some extent

## 2012-06-15 NOTE — Progress Notes (Signed)
  Subjective:    Patient ID: Taylor Silva, female    DOB: Sep 02, 1955, 57 y.o.   MRN: [id number]  HPI Complete physical exam We also discussed low back pain, obesity, depression. See assessment and plan.  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 Depression not completely well-controlled, denies suicidal ideas.     Objective:   Physical Exam  BP 116/82  Pulse 89  Temp(Src) 98.4 F (36.9 C) (Oral)  Ht 5' 5.5" (1.664 m)  Wt 266 lb (120.657 kg)  BMI 43.58 kg/m2  SpO2 98% General -- alert, well-developed, nad Neck --no thyromegaly  Lungs -- normal respiratory effort, no intercostal retractions, no accessory muscle use, and normal breath sounds.   Heart-- normal rate, regular rhythm, no murmur, and no gallop.   Abdomen--soft, non-tender, no distention, no masses, no HSM, no guarding, and no rigidity.   Extremities-- no pretibial pitting edema   Neurologic-- alert & oriented X3 and strength normal in all extremities. Psych-- Cognition and judgment appear intact. Alert and cooperative with normal attention span and concentration.  not anxious appearing and not depressed appearing.       Assessment & Plan:

## 2012-06-16 ENCOUNTER — Other Ambulatory Visit: Payer: Self-pay | Admitting: Internal Medicine

## 2012-06-16 ENCOUNTER — Encounter: Payer: Self-pay | Admitting: Internal Medicine

## 2012-06-17 ENCOUNTER — Other Ambulatory Visit: Payer: BC Managed Care – PPO

## 2012-06-17 NOTE — Telephone Encounter (Signed)
Refill done.  

## 2012-06-18 ENCOUNTER — Other Ambulatory Visit (INDEPENDENT_AMBULATORY_CARE_PROVIDER_SITE_OTHER): Payer: BC Managed Care – PPO

## 2012-06-18 DIAGNOSIS — Z0000 Encounter for general adult medical examination without abnormal findings: Secondary | ICD-10-CM

## 2012-06-18 LAB — COMPREHENSIVE METABOLIC PANEL
AST: 21 U/L (ref 0–37)
Albumin: 3.7 g/dL (ref 3.5–5.2)
BUN: 18 mg/dL (ref 6–23)
CO2: 29 mEq/L (ref 19–32)
Calcium: 9.1 mg/dL (ref 8.4–10.5)
Chloride: 105 mEq/L (ref 96–112)
Creatinine, Ser: 0.9 mg/dL (ref 0.4–1.2)
GFR: 68.86 mL/min (ref >60.00)
Glucose, Bld: 88 mg/dL (ref 70–99)
Potassium: 3.8 mEq/L (ref 3.5–5.1)

## 2012-06-18 LAB — CBC WITH DIFFERENTIAL/PLATELET
Basophils Absolute: 0 10*3/uL (ref 0.0–0.1)
Basophils Relative: 0.7 % (ref 0.0–3.0)
Eosinophils Absolute: 0.2 10*3/uL (ref 0.0–0.7)
Hemoglobin: 13.3 g/dL (ref 12.0–15.0)
Lymphocytes Relative: 45.1 % (ref 12.0–46.0)
MCHC: 33.4 g/dL (ref 30.0–36.0)
Monocytes Relative: 6.6 % (ref 3.0–12.0)
Neutrophils Relative %: 44.7 % (ref 43.0–77.0)
RBC: 4.48 Mil/uL (ref 3.87–5.11)
WBC: 5.4 10*3/uL (ref 4.5–10.5)

## 2012-06-18 LAB — LIPID PANEL
Cholesterol: 190 mg/dL (ref 0–200)
Triglycerides: 51 mg/dL (ref 0.0–149.0)

## 2012-06-23 LAB — VITAMIN D 1,25 DIHYDROXY
Vitamin D 1, 25 (OH)2 Total: 37 pg/mL (ref 18–72)
Vitamin D2 1, 25 (OH)2: <8 pg/mL
Vitamin D3 1, 25 (OH)2: 37 pg/mL

## 2012-07-16 ENCOUNTER — Telehealth: Payer: Self-pay | Admitting: *Deleted

## 2012-07-16 MED ORDER — ROPINIROLE HCL 0.5 MG PO TABS: 1.0000 mg | ORAL_TABLET | Freq: Every day | ORAL | Status: DC

## 2012-07-16 NOTE — Telephone Encounter (Signed)
Rx sent 

## 2012-07-16 NOTE — Telephone Encounter (Signed)
Last OV 06-15-12, no refill history. Please advise

## 2012-07-16 NOTE — Telephone Encounter (Signed)
Ok RF 6 months  

## 2012-07-20 ENCOUNTER — Encounter: Payer: Self-pay | Admitting: Internal Medicine

## 2012-07-20 ENCOUNTER — Ambulatory Visit (INDEPENDENT_AMBULATORY_CARE_PROVIDER_SITE_OTHER): Payer: BC Managed Care – PPO | Admitting: Internal Medicine

## 2012-07-20 VITALS — BP 130/85 | HR 90 | Temp 98.1°F | Wt 270.2 lb

## 2012-07-20 DIAGNOSIS — E669 Obesity, unspecified: Secondary | ICD-10-CM

## 2012-07-20 NOTE — Progress Notes (Signed)
  Subjective:    Patient ID: Taylor Silva, female    DOB: [birth year], 57 y.o.   MRN: [id number]  HPI Bariatric surgery? Went to the surgery clinic to investigate bariatric surgery, was given a package of information and was  asked to see me. In the last several months, she has been able to "eat very little and lose some weight" temporarily but then she regains weight, gets very discourage and go back to "over eat". She was walking 30 minutes daily until she developed plantar fasciitis, now not exercising much. Has not go seen a nutritionist in long time.   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 Alert and cooperative with normal attention span and concentration.  not anxious appearing and not depressed appearing.       Assessment & Plan:   Today , I spent more than 25 min with the patient, >50% of the time counseling

## 2012-07-20 NOTE — Patient Instructions (Addendum)
Qsymia? Belviq? Call in one week

## 2012-07-20 NOTE — Assessment & Plan Note (Addendum)
Patient with a BMI of 44, she really desires to lose weight. I discussed with her 2 FDA approved medications called Belviq, Qsymi, Information provided, she will think about them, do her own research and  Call in ~ one week to see if she would like to try such medicines. It is extremely important that she remains active, I suggested a recumbent bicycle if she has problems with feet pain. Is important that she eats healthy, has not seen a nutritionist in a long  time, referral done. At the end, she may need bariatric surgery if  is deemed to be a good candidate by the surgical group. Her doctors  at the spine and [facility]osis office Are very supportive of her efforts to lose weight.

## 2012-07-26 ENCOUNTER — Telehealth: Payer: Self-pay | Admitting: Internal Medicine

## 2012-07-26 NOTE — Telephone Encounter (Signed)
Patient wants to let Dr. Drue Novel know that she is interested in the Belviq dietary supplement. States that she was told to call back with this information.

## 2012-07-27 ENCOUNTER — Encounter: Payer: Self-pay | Admitting: Family Medicine

## 2012-07-27 ENCOUNTER — Ambulatory Visit (INDEPENDENT_AMBULATORY_CARE_PROVIDER_SITE_OTHER): Payer: BC Managed Care – PPO | Admitting: Family Medicine

## 2012-07-27 VITALS — BP 134/70 | HR 80 | Temp 98.4°F | Wt 270.0 lb

## 2012-07-27 DIAGNOSIS — J019 Acute sinusitis, unspecified: Secondary | ICD-10-CM

## 2012-07-27 DIAGNOSIS — R42 Dizziness and giddiness: Secondary | ICD-10-CM

## 2012-07-27 MED ORDER — METHYLPREDNISOLONE ACETATE 80 MG/ML IJ SUSP
80.0000 mg | Freq: Once | INTRAMUSCULAR | Status: AC
  Administered 2012-07-27: 80 mg via INTRAMUSCULAR

## 2012-07-27 MED ORDER — LEVOFLOXACIN 500 MG PO TABS: 500.0000 mg | ORAL_TABLET | Freq: Every day | ORAL | Status: DC

## 2012-07-27 MED ORDER — MECLIZINE HCL 25 MG PO TABS: 25.0000 mg | ORAL_TABLET | Freq: Three times a day (TID) | ORAL | Status: DC | PRN

## 2012-07-27 MED ORDER — PREDNISONE 10 MG PO TABS: ORAL_TABLET | ORAL | Status: DC

## 2012-07-27 NOTE — Patient Instructions (Signed)

## 2012-07-27 NOTE — Telephone Encounter (Signed)
FYI

## 2012-07-27 NOTE — Progress Notes (Signed)
  Subjective:     Taylor Silva is a 57 y.o. female who presents for evaluation of sinus pain. Symptoms include: congestion, facial pain, headaches, nasal congestion, sinus pressure, tooth pain and dizziness. Onset of symptoms was 2 days ago. Symptoms have been gradually worsening since that time. Past history is significant for no history of pneumonia or bronchitis. Patient is a non-smoker.  The following portions of the patient's history were reviewed and updated as appropriate: allergies, current medications, past family history, past medical history, past social history, past surgical history and problem list.  Review of Systems Pertinent items are noted in HPI.   Objective:    BP 134/70  Pulse 80  Temp(Src) 98.4 F (36.9 C) (Oral)  Wt 270 lb (122.471 kg)  BMI 44.23 kg/m2  SpO2 98% General appearance: alert, cooperative, appears stated age and no distress Ears: normal TM's and external ear canals both ears Nose: Nares normal. Septum midline. Mucosa normal. No drainage or sinus tenderness., green discharge, moderate congestion, turbinates red, swollen, sinus tenderness bilateral Throat: lips, mucosa, and tongue normal; teeth and gums normal Neck: moderate anterior cervical adenopathy, supple, symmetrical, trachea midline and thyroid not enlarged, symmetric, no tenderness/mass/nodules Lungs: clear to auscultation bilaterally Heart: S1, S2 normal Extremities: extremities normal, atraumatic, no cyanosis or edema    Assessment:    Acute bacterial sinusitis.    Plan:    Nasal steroids per medication orders. Antihistamines per medication orders. levaquin per medication orders. pred taper

## 2012-07-28 ENCOUNTER — Ambulatory Visit (HOSPITAL_COMMUNITY): Payer: BC Managed Care – PPO | Admitting: Psychiatry

## 2012-07-28 ENCOUNTER — Telehealth: Payer: Self-pay | Admitting: Internal Medicine

## 2012-07-28 NOTE — Telephone Encounter (Signed)
If dizziness is severe or she has severe headache, severe facial pain or double vision ---> needs  to be seen.  Otherwise rest, fluids, continue with treatment as prescribed by Dr. Laury Axon and wait 2-3 days.

## 2012-07-28 NOTE — Telephone Encounter (Signed)
Patient Information:  Caller Name: Raya  Phone: [phone]  Patient: Taylor Silva, Taylor Silva  Gender: Female  DOB: [birth year]  Age: 57 Years  PCP: Willow Ora  Office Follow Up:  Does the office need to follow up with this patient?: Yes  Instructions For The Office: Patient is complaining of increased dizziness; refused to come back to office due to inability to sit.  Feels like the Prednisone is making her worse with dizziness/vomiting x 1.  Please follow up with patient regarding medications and dizziness.  RN Note:  Patient calls due to increased dizziness, feels like the prednisone is to blame.  Wants to know when will medications start working. She feels worse today than yesterday.  Threw up after taking prednisone. Is drinking with no signs of hydration.  Husband stayed home from work to assist her when up due to her dizziness and fear of falling.  With level of dizziness patient triaged to office now.  Patient declined stating she can sit to come back in. Care advice given with RN forwarding a note to office for a return call for further advice.  Caller demonstrated her understanding  Symptoms  Reason For Call & Symptoms: Patient calls wanting to know how long it takes for dizziness medicine kick in.  She does not believe it is helping. She feels worse with dizziness-went to work and is having dizziness more  Reviewed Health History In EMR: Yes  Reviewed Medications In EMR: Yes  Reviewed Allergies In EMR: Yes  Reviewed Surgeries / Procedures: Yes  Date of Onset of Symptoms: 07/27/2012  Treatments Tried: Feels that medicines are not working.  Treatments Tried Worked: No  Guideline(s) Used:  Dizziness  Disposition Per Guideline:   Go to Office Now  Reason For Disposition Reached:   Lightheadedness (dizziness) present now, after 2 hours of rest and fluids  Advice Given:  Drink Fluids:  Drink several glasses of fruit juice, other clear fluids, or water. This will improve  hydration and blood glucose. If you have a fever or have had heat exposure, make sure the fluids are cold.  Call Back If:  Passes out (faints)  You become worse.  Patient Refused Recommendation:  Patient Refused Care Advice  Patient is not willing due to dizziness to come back to office.

## 2012-07-28 NOTE — Telephone Encounter (Signed)
Seen 07/27/12 by Dr.Lowne for dizziness and given Levaquin, meclizine and prednisone for sinusitis. She was given DepoMedrol in the office. She is still having dizziness. Please advise     KP

## 2012-07-29 MED ORDER — FLUTICASONE PROPIONATE 50 MCG/ACT NA SUSP: NASAL | Status: DC

## 2012-07-29 MED ORDER — LORCASERIN HCL 10 MG PO TABS: 1.0000 | ORAL_TABLET | Freq: Two times a day (BID) | ORAL | Status: DC

## 2012-07-29 NOTE — Telephone Encounter (Signed)
Discussed with patient, verbalized understanding. rx sent to pharmacy per orders.

## 2012-07-29 NOTE — Telephone Encounter (Signed)
Spoke with patient and she stated she can still feel a lot of pressure and she wanted to know if she should take a antihistamine or nasal spray. Dr.Lowne indicated a steroid nasal spray but nothing was sent to the pharmacy. Please advise      KP

## 2012-07-29 NOTE — Telephone Encounter (Signed)
lmovm to return call  °

## 2012-07-29 NOTE — Telephone Encounter (Signed)
Flonase, 2 sprays in each side of the nose daily # one bottle 3RF, take for at least 4 weeks

## 2012-07-29 NOTE — Telephone Encounter (Signed)
Tried calling the patient but the line is ringing busy.      KP

## 2012-07-29 NOTE — Telephone Encounter (Signed)
Advise patient, I just sent a prescription for Belviq one tablet twice a day. In addition to the medication I strongly recommend her to exercise 30 minutes daily. I also sent her to see a nutritionist, strongly encouraged to followup with them. Schedule office visit 2 months from today.  Discontinue the medication and let me know if she has adverse side effects

## 2012-07-29 NOTE — Telephone Encounter (Signed)
Discussed with patient, verbalized understanding. Faxed rx to pharmacy.

## 2012-07-30 ENCOUNTER — Encounter: Payer: Self-pay | Admitting: *Deleted

## 2012-07-30 ENCOUNTER — Telehealth: Payer: Self-pay | Admitting: Internal Medicine

## 2012-07-30 NOTE — Telephone Encounter (Signed)
Ok, please do.

## 2012-07-30 NOTE — Telephone Encounter (Signed)
Pt. Made aware work note at front desk for pickup. Verbalized understanding.

## 2012-07-30 NOTE — Telephone Encounter (Signed)
Patient was seen for sinus infection and vertigo. She would like a work note for dates 7/23,7/24,7/25.

## 2012-07-30 NOTE — Telephone Encounter (Signed)
Please advise. Thanks.  

## 2012-08-02 ENCOUNTER — Telehealth: Payer: Self-pay | Admitting: *Deleted

## 2012-08-02 NOTE — Telephone Encounter (Signed)
Patient called with [employer] on the line to have her medication belviq  Authorized. Medication was authorized and patient may pick up when ready.

## 2012-08-02 NOTE — Telephone Encounter (Signed)
Called patient to explain that I was unable to gain Prior Auth for medication from her [employer].  Patient stated that she would call her Insurance and ask what medications are covered by her insurance and would call our office back asap.

## 2012-08-05 ENCOUNTER — Telehealth: Payer: Self-pay | Admitting: Internal Medicine

## 2012-08-05 NOTE — Telephone Encounter (Signed)
Patient states that she has finished the medications she was prescribed for her sinusitis but still has pressure in her ears. Wants to know what she should do. Please advise.

## 2012-08-09 NOTE — Telephone Encounter (Signed)
OV please

## 2012-08-09 NOTE — Telephone Encounter (Signed)
Spoke with pt and she states that she is not feeling any better since finishing the Levaquin and prednisone pack. She still has c/o ear fullness and sinus pressure. Do you want to try another abx or does pt need another OV?

## 2012-08-10 NOTE — Telephone Encounter (Signed)
spoke with patient, scheduled OV 08/13/12

## 2012-08-11 ENCOUNTER — Telehealth: Payer: Self-pay | Admitting: Internal Medicine

## 2012-08-11 DIAGNOSIS — J329 Chronic sinusitis, unspecified: Secondary | ICD-10-CM

## 2012-08-11 NOTE — Telephone Encounter (Signed)
Arrange referral, dx sinusitis

## 2012-08-11 NOTE — Telephone Encounter (Signed)
Patient calling today, is requesting a referral to ENT, for her ears.  Patient states as of today, she is still having pain off and on in both ears, along with a ringing noise.  Patient states she doesn't want to come back into office to be seen, or to be placed on another round of antibiotics.  She feels at this point she should just see a specialist.  (Reference 08/05/12 phone note & 07/20/12 office visit)  Please advise.

## 2012-08-11 NOTE — Telephone Encounter (Signed)
Referral orders placed

## 2012-08-12 ENCOUNTER — Encounter: Payer: Self-pay | Admitting: Lab

## 2012-08-13 ENCOUNTER — Ambulatory Visit: Payer: BC Managed Care – PPO | Admitting: Internal Medicine

## 2012-09-13 ENCOUNTER — Telehealth: Payer: Self-pay | Admitting: Internal Medicine

## 2012-09-13 NOTE — Telephone Encounter (Signed)
Pt requesting Tramadol  Last OV- 06/15/12  No future OV No UDS contract.  Please advise.DJR

## 2012-09-13 NOTE — Telephone Encounter (Signed)
Please explain the patient our policy, needs a contract and UDS, ok to Rx once we have the contract

## 2012-09-13 NOTE — Telephone Encounter (Signed)
Patient states that she needs a refill of her Tramadol rx sent to [facility] on [address].

## 2012-09-14 MED ORDER — TRAMADOL HCL 50 MG PO TABS: 50.0000 mg | ORAL_TABLET | Freq: Three times a day (TID) | ORAL | Status: DC | PRN

## 2012-09-14 NOTE — Addendum Note (Signed)
Addended by: Eustace Quail on: 09/14/2012 01:54 PM   Modules accepted: Orders

## 2012-09-14 NOTE — Telephone Encounter (Signed)
Pt notified via telephone of our UDS policy and contract before she can get her rx refill. DJR

## 2012-09-23 ENCOUNTER — Encounter: Payer: BC Managed Care – PPO | Attending: Internal Medicine | Admitting: *Deleted

## 2012-09-23 ENCOUNTER — Encounter: Payer: Self-pay | Admitting: *Deleted

## 2012-09-23 VITALS — Ht 65.0 in | Wt 250.0 lb

## 2012-09-23 DIAGNOSIS — E669 Obesity, unspecified: Secondary | ICD-10-CM | POA: Insufficient documentation

## 2012-09-23 DIAGNOSIS — Z713 Dietary counseling and surveillance: Secondary | ICD-10-CM | POA: Insufficient documentation

## 2012-09-23 NOTE — Progress Notes (Signed)
Medical Nutrition Therapy:  Appt start time: 1630 end time:  1730.  Assessment:  Primary concern today: Weight management. Patient with a history of weight loss attempts. She is currently using a Education officer, museum with food tracking app on her phone to track activity and diet. She is also taking Belviq to assist with weight loss. Per diet app, her average intake is around 900 calories, which is very low, and likely not meeting her nutritional needs. She reports weighing herself daily, but is discouraged by daily fluctuations in weight. Per chart review, she has lost about 20 pounds in the last 2 months. She also reports fructose intolerance, which limits intake. Physical activity is limited due to planar fasciitis, but she averages about 6000 steps daily.  MEDICATIONS: Osteo BiFlex, calcium +D, multivitamin, fish oil, vitamin D, protonix, Belviq   DIETARY INTAKE:   Usual eating pattern includes 3 meals and 1 snacks per day.  24-hr recall:  B ( AM): Adkins diet protein bar OR egg beaters Snk ( AM): None  L ( PM): Weight Watchers/Smart Ones frozen dinner Snk ( PM): None, or 100 calorie pack D ( PM): Same as lunch Snk ( PM): Nectarine Beverages: Water  Usual physical activity: None, 6,000 steps daily per [employer]  Estimated energy needs: 1200 calories 150 g carbohydrates 75 g protein 33 g fat  Progress Towards Goal(s):  In progress.   Nutritional Diagnosis:  [place]-3.3 Overweight/obesity As related to history of excessive energy intake.  As evidenced by BMI 41.6.    Intervention:  Nutrition counseling. We discussed strategies for weight loss, including balancing nutrients (carbs, protein, fat), portion control, healthy snacks, and exercise.   Goals:  1. 1-2 pounds weight loss per week.  2. Increase calories to 1200 per day to promote adequate nutrition, while facilitating healthy weight loss.  3. Consider exercising at the gym at least 2-3 days weekly (recumbent bike, weights).   Handouts given  during visit include:  Yellow meal plan card  Monitoring/Evaluation:  Dietary intake, exercise, and body weight in 1 month(s).

## 2012-10-06 ENCOUNTER — Encounter: Payer: Self-pay | Admitting: Internal Medicine

## 2012-10-06 ENCOUNTER — Ambulatory Visit (INDEPENDENT_AMBULATORY_CARE_PROVIDER_SITE_OTHER): Payer: BC Managed Care – PPO | Admitting: Internal Medicine

## 2012-10-06 VITALS — BP 118/81 | HR 91 | Temp 99.1°F | Wt 244.0 lb

## 2012-10-06 DIAGNOSIS — E669 Obesity, unspecified: Secondary | ICD-10-CM

## 2012-10-06 DIAGNOSIS — M79673 Pain in unspecified foot: Secondary | ICD-10-CM

## 2012-10-06 DIAGNOSIS — M79609 Pain in unspecified limb: Secondary | ICD-10-CM

## 2012-10-06 MED ORDER — HYDROCHLOROTHIAZIDE 12.5 MG PO CAPS: 12.5000 mg | ORAL_CAPSULE | Freq: Every day | ORAL | Status: DC

## 2012-10-06 MED ORDER — LORCASERIN HCL 10 MG PO TABS: 1.0000 | ORAL_TABLET | Freq: Two times a day (BID) | ORAL | Status: DC

## 2012-10-06 NOTE — Progress Notes (Signed)
  Subjective:    Patient ID: Taylor Silva, female    DOB: 05/12/1955, 57 y.o.   MRN: [id number]  HPI Routine followup Obesity-- starterd Belviq,  saw a nutritionist. Doing very well with diet and exercise, thinks her new lifestyle  is sustainable  Past Medical History  Diagnosis Date  . Fructose intolerance   . Arthritis   . Seasonal allergies   . Restless legs   . Depression   . History of chicken pox   . Hyperlipidemia   . Heart murmur    Past Surgical History  Procedure Laterality Date  . Knee surgery  04/2009    right knee, meniscus tear  . Wrist surgery  1980s    cyst removed from right wrist  . Abdominal hysterectomy  1995  . Other surgical history  1995?    removed scar tissue from colon   History   Social History  . Marital Status: Married    Spouse Name: N/A    Number of Children: 1  . 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 Smoking status: Never Smoker   . Smokeless tobacco: Never Used  . Alcohol Use: No  . Drug Use: No  . Sexual Activity: Not on file   Other Topics Concern  . Not on file   Social History Narrative   Lives with husband who is transplant patient   Completed some college                Review of Systems    In general feels great since she started losing weight. Still has some back pain, run out of, will few weeks ago, would like a refill. Had plantar fasciitis, improving at this time Would like to change hydrochlorothiazide from tablets to capsules, needs a new prescription    Objective:   Physical Exam BP 118/81  Pulse 91  Temp(Src) 99.1 F (37.3 C)  Wt 244 lb (110.678 kg)  BMI 40.6 kg/m2  SpO2 96% General -- alert, well-developed, NAD.  Lungs -- normal respiratory effort, no intercostal retractions, no accessory muscle use, and normal breath sounds.  Heart-- normal rate, regular rhythm, no murmur.  Neurologic--  alert & oriented X3.  Speech normal, gait normal, strength normal in all extremities.  Psych-- Cognition and judgment appear intact. Cooperative with normal attention span and concentration. No anxious appearing , no depressed appearing.       Assessment & Plan:

## 2012-10-06 NOTE — Patient Instructions (Signed)
Serotonin Syndrome  Serotonin is a brain chemical that regulates the nervous system. Some kinds of drugs increase the amount of serotonin in your body. Drugs that increase the serotonin in your body include:   · Anti-depressant medications.  · St. John's wort.  · Recreational drugs.  · Migraine medicines.  · Some pain medicines.  SYMPTOMS  Combining these drugs increases the risk that you will become ill with a toxic condition called serotonin syndrome.   Symptoms of too much serotonin include:  · Confusion.  · Agitation.  · Weakness.  · Insomnia.  · Fever.  · Sweats.  Other symptoms that may develop include:  · Shakiness.  · Muscle spasms.  · Seizures.  TREATMENT  · Hospital treatment is often needed until the effects are controlled.  · Avoiding the combination of medicines listed above is recommended.  · Check with your doctor if you are concerned about your medicine or the side effects.  Document Released: 01/31/2004 Document Revised: 03/17/2011 Document Reviewed: 12/23/2004  ExitCare® Patient Information ©2014 [employer].

## 2012-10-06 NOTE — Assessment & Plan Note (Signed)
Improving.

## 2012-10-06 NOTE — Assessment & Plan Note (Addendum)
Since the last time she was here, she saw a nutritionist, is eating better, is more active, started Belviq with very good tolerance. Has lost almost 30 pounds, feels much better in general. Due to potential for serotonine syndrome, I recommend to discontinue tramadol. There is still a potential interaction between Belviq  and Wellbutrin however she is doing well. Information about serotonin syndrome discussed. Plan:  Continue with her healthy lifestyle RF meds  Office visit in 2 months.Marland Kitchen

## 2012-10-16 ENCOUNTER — Other Ambulatory Visit: Payer: Self-pay | Admitting: Internal Medicine

## 2012-10-18 ENCOUNTER — Other Ambulatory Visit: Payer: Self-pay | Admitting: *Deleted

## 2012-10-18 MED ORDER — BUPROPION HCL 75 MG PO TABS: 75.0000 mg | ORAL_TABLET | Freq: Two times a day (BID) | ORAL | Status: DC

## 2012-10-18 NOTE — Telephone Encounter (Signed)
Bupropion refill sent to pharmacy 

## 2012-10-28 ENCOUNTER — Ambulatory Visit: Payer: Self-pay | Admitting: *Deleted

## 2012-11-23 ENCOUNTER — Telehealth: Payer: Self-pay | Admitting: *Deleted

## 2012-11-23 NOTE — Telephone Encounter (Signed)
Patient is requesting refill on Belviq.  Last seen-10/06/2012  Last filled-10/06/2012  Please advise. SW

## 2012-11-24 MED ORDER — LORCASERIN HCL 10 MG PO TABS: 1.0000 | ORAL_TABLET | Freq: Two times a day (BID) | ORAL | Status: DC

## 2012-11-24 NOTE — Telephone Encounter (Signed)
rx printed, due for a check next month, please remind pt

## 2012-11-24 NOTE — Telephone Encounter (Signed)
Script faxed to pharmacy

## 2013-03-18 ENCOUNTER — Encounter: Payer: Self-pay | Admitting: Physician Assistant

## 2013-03-18 ENCOUNTER — Ambulatory Visit (INDEPENDENT_AMBULATORY_CARE_PROVIDER_SITE_OTHER): Payer: BC Managed Care – PPO | Admitting: Physician Assistant

## 2013-03-18 ENCOUNTER — Telehealth: Payer: Self-pay | Admitting: *Deleted

## 2013-03-18 VITALS — BP 116/84 | HR 84 | Temp 99.1°F | Resp 16 | Ht 65.0 in | Wt 265.0 lb

## 2013-03-18 DIAGNOSIS — M25561 Pain in right knee: Secondary | ICD-10-CM

## 2013-03-18 DIAGNOSIS — J019 Acute sinusitis, unspecified: Secondary | ICD-10-CM

## 2013-03-18 MED ORDER — LEVOFLOXACIN 750 MG PO TABS: 750.0000 mg | ORAL_TABLET | Freq: Every day | ORAL | Status: DC

## 2013-03-18 NOTE — Assessment & Plan Note (Signed)
Patient with multiple medication allergy. Rx Levaquin. Increase fluid intake. Rest. Flonase. Humidifier in bedroom. Plan Mucinex. Call or return to clinic if symptoms not improving.

## 2013-03-18 NOTE — Progress Notes (Signed)
Patient presents to clinic today c/o 3 weeks of sinus pressure, sinus pain, bilateral ear pain, fatigue and dry cough. Patient endorses significant sinus headache. Denies shortness of breath or wheezing. Denies history of asthma. Patient does endorse intermittent, low-grade fevers. Denies recent travel or sick contact. Missed 2 days of work. Needs note.  Past Medical History  Diagnosis Date  . Fructose intolerance   . Arthritis   . Seasonal allergies   . Restless legs   . Depression   . Hyperlipidemia   . Heart murmur   . Sleep apnea     Intolerant to CPAP  . Obesity     Current Outpatient Prescriptions on File Prior to Visit  Medication Sig Dispense Refill  . aspirin-acetaminophen-caffeine (EXCEDRIN MIGRAINE) 250-250-65 MG per tablet Take 1 tablet by mouth every 6 (six) hours as needed. headache      . buPROPion (WELLBUTRIN) 75 MG tablet TAKE ONE TABLET BY MOUTH TWICE DAILY  60 tablet  0  . cetirizine (ZYRTEC) 10 MG tablet Take 10 mg by mouth daily.      . diphenhydramine-acetaminophen (TYLENOL PM) 25-500 MG TABS Take 1 tablet by mouth at bedtime as needed.      . fish oil-omega-3 fatty acids 1000 MG capsule Take 1,000 mg by mouth daily.        Marland Kitchen GABAPENTIN PO Take by mouth. Per spine specialist      . hydrochlorothiazide (MICROZIDE) 12.5 MG capsule Take 1 capsule (12.5 mg total) by mouth daily.  180 capsule  1  . Multiple Vitamins-Minerals (MULTIVITAMIN WITH MINERALS) tablet Take 1 tablet by mouth daily.        . NON FORMULARY 2 tablets daily. Osteo-biflex triple strength      . pantoprazole (PROTONIX) 40 MG tablet Take 40 mg by mouth daily.        Marland Kitchen rOPINIRole (REQUIP) 0.5 MG tablet Take 2 tablets (1 mg total) by mouth at bedtime.  60 tablet  6  . VITAMIN D, CHOLECALCIFEROL, PO Take 5,000 Units by mouth. Vitamin d 3      . fluticasone (FLONASE) 50 MCG/ACT nasal spray 2 sprays in each side of nose daily  16 g  3  . [DISCONTINUED] estrogens, conjugated, (PREMARIN) 0.625 MG tablet Take  0.625 mg by mouth daily. Take daily for 21 days then do not take for 7 days.       . [DISCONTINUED] fexofenadine (ALLEGRA) 180 MG tablet Take 180 mg by mouth daily.        . [DISCONTINUED] topiramate (TOPAMAX) 100 MG tablet Take 100 mg by mouth at bedtime.         No current facility-administered medications on file prior to visit.    Allergies  Allergen Reactions  . Codeine Anaphylaxis  . Erythromycin Hives  . Tetracyclines & Related Hives  . Penicillins Hives  . Sulfa Antibiotics Nausea And Vomiting    Family History  Problem Relation Age of Onset  . Diabetes Mother   . Hyperlipidemia Mother   . Hypertension Mother   . Heart attack Mother   . Diabetes Sister   . Hyperlipidemia Sister   . Hypertension Sister   . Diabetes Brother   . Hyperlipidemia Brother   . Hypertension Brother   . Heart attack Brother   . Diabetes Maternal Grandmother   . Hyperlipidemia Maternal Grandmother   . Hypertension Maternal Grandmother   . Heart disease Father   . Leukemia Father     History   Social History  .  Marital Status: Married    Spouse Name: N/A    Number of Children: 1  . 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 Smoking status: Never Smoker   . Smokeless tobacco: Never Used  . Alcohol Use: No  . Drug Use: No  . Sexual Activity: None   Other Topics Concern  . None   Social History Narrative   Lives with husband who is transplant patient   Completed some college                Review of Systems - See HPI.  All other ROS are negative.  BP 116/84  Pulse 84  Temp(Src) 99.1 F (37.3 C) (Oral)  Resp 16  Ht 5\' 5"  (1.651 m)  Wt 265 lb (120.203 kg)  BMI 44.10 kg/m2  SpO2 97%  Physical Exam  Vitals reviewed. Constitutional: She is oriented to person, place, and time and well-developed, well-nourished, and in no distress.  HENT:  Head: Normocephalic and atraumatic.  Right Ear:  Tympanic membrane normal.  Left Ear: Tympanic membrane normal.  Nose: Mucosal edema present. Right sinus exhibits maxillary sinus tenderness and frontal sinus tenderness. Left sinus exhibits maxillary sinus tenderness and frontal sinus tenderness.  Mouth/Throat: Uvula is midline, oropharynx is clear and moist and mucous membranes are normal. No oropharyngeal exudate, posterior oropharyngeal edema, posterior oropharyngeal erythema or tonsillar abscesses.  Eyes: Conjunctivae are normal. Pupils are equal, round, and reactive to light.  Neck: Neck supple.  Cardiovascular: Normal rate, regular rhythm, normal heart sounds and intact distal pulses.   Pulmonary/Chest: Effort normal and breath sounds normal. No respiratory distress. She has no wheezes. She has no rales. She exhibits no tenderness.  Lymphadenopathy:    She has no cervical adenopathy.  Neurological: She is alert and oriented to person, place, and time.  Skin: Skin is warm and dry. No rash noted.  Psychiatric: Affect normal.    No results found for this or any previous visit (from the past 2160 hour(s)).  Assessment/Plan: Acute sinusitis with symptoms > 10 days Patient with multiple medication allergy. Rx Levaquin. Increase fluid intake. Rest. Flonase. Humidifier in bedroom. Plan Mucinex. Call or return to clinic if symptoms not improving.

## 2013-03-18 NOTE — Patient Instructions (Signed)
Please take antibiotic as prescribed.  Increase fluid intake.  Rest.  Use Flonase daily.  Keep humidifier in the bedroom.  Take plain Mucinex.  Delsym for cough.  Call or return to clinic if symptoms are not improving.  Sinusitis Sinusitis is redness, soreness, and swelling (inflammation) of the paranasal sinuses. Paranasal sinuses are air pockets within the bones of your face (beneath the eyes, the middle of the forehead, or above the eyes). In healthy paranasal sinuses, mucus is able to drain out, and air is able to circulate through them by way of your nose. However, when your paranasal sinuses are inflamed, mucus and air can become trapped. This can allow bacteria and other germs to grow and cause infection. Sinusitis can develop quickly and last only a short time (acute) or continue over a long period (chronic). Sinusitis that lasts for more than 12 weeks is considered chronic.  CAUSES  Causes of sinusitis include:  Allergies.  Structural abnormalities, such as displacement of the cartilage that separates your nostrils (deviated septum), which can decrease the air flow through your nose and sinuses and affect sinus drainage.  Functional abnormalities, such as when the small hairs (cilia) that line your sinuses and help remove mucus do not work properly or are not present. SYMPTOMS  Symptoms of acute and chronic sinusitis are the same. The primary symptoms are pain and pressure around the affected sinuses. Other symptoms include:  Upper toothache.  Earache.  Headache.  Bad breath.  Decreased sense of smell and taste.  A cough, which worsens when you are lying flat.  Fatigue.  Fever.  Thick drainage from your nose, which often is green and may contain pus (purulent).  Swelling and warmth over the affected sinuses. DIAGNOSIS  Your caregiver will perform a physical exam. During the exam, your caregiver may:  Look in your nose for signs of abnormal growths in your nostrils  (nasal polyps).  Tap over the affected sinus to check for signs of infection.  View the inside of your sinuses (endoscopy) with a special imaging device with a light attached (endoscope), which is inserted into your sinuses. If your caregiver suspects that you have chronic sinusitis, one or more of the following tests may be recommended:  Allergy tests.  Nasal culture A sample of mucus is taken from your nose and sent to a lab and screened for bacteria.  Nasal cytology A sample of mucus is taken from your nose and examined by your caregiver to determine if your sinusitis is related to an allergy. TREATMENT  Most cases of acute sinusitis are related to a viral infection and will resolve on their own within 10 days. Sometimes medicines are prescribed to help relieve symptoms (pain medicine, decongestants, nasal steroid sprays, or saline sprays).  However, for sinusitis related to a bacterial infection, your caregiver will prescribe antibiotic medicines. These are medicines that will help kill the bacteria causing the infection.  Rarely, sinusitis is caused by a fungal infection. In theses cases, your caregiver will prescribe antifungal medicine. For some cases of chronic sinusitis, surgery is needed. Generally, these are cases in which sinusitis recurs more than 3 times per year, despite other treatments. HOME CARE INSTRUCTIONS   Drink plenty of water. Water helps thin the mucus so your sinuses can drain more easily.  Use a humidifier.  Inhale steam 3 to 4 times a day (for example, sit in the bathroom with the shower running).  Apply a warm, moist washcloth to your face 3 to 4  times a day, or as directed by your caregiver.  Use saline nasal sprays to help moisten and clean your sinuses.  Take over-the-counter or prescription medicines for pain, discomfort, or fever only as directed by your caregiver. SEEK IMMEDIATE MEDICAL CARE IF:  You have increasing pain or severe headaches.  You  have nausea, vomiting, or drowsiness.  You have swelling around your face.  You have vision problems.  You have a stiff neck.  You have difficulty breathing. MAKE SURE YOU:   Understand these instructions.  Will watch your condition.  Will get help right away if you are not doing well or get worse. Document Released: 12/23/2004 Document Revised: 03/17/2011 Document Reviewed: 01/07/2011 [facility] Patient Information 2014 [place], [place].

## 2013-03-18 NOTE — Progress Notes (Signed)
Pre visit review using our clinic review tool, if applicable. No additional management support is needed unless otherwise documented below in the visit note/SLS  

## 2013-03-18 NOTE — Telephone Encounter (Signed)
Refill request for Tramadol 50 mg Last filled by MD on - 09.04.14--Discontinued by provider on 10.01.14 d/t use of Belviq Last AEX - 10.01.14 [PCP] Patient reports that she has discontinued taking Belviq d/t not being effective enough weight loss and was told by PCP that could resume taking Tramadol if she stopped Belviq; pt is requesting new Rx for Tramadol now/SLS Please Advise.

## 2013-03-23 NOTE — Telephone Encounter (Signed)
D/c belviq  from the medication list. Okay to prescribe tramadol #30, no RF, same sig

## 2013-03-24 MED ORDER — TRAMADOL HCL 50 MG PO TABS: 50.0000 mg | ORAL_TABLET | Freq: Three times a day (TID) | ORAL | Status: DC | PRN

## 2013-03-24 NOTE — Telephone Encounter (Signed)
rx signed by [facility] and faxed to [facility] [address].

## 2013-04-04 ENCOUNTER — Other Ambulatory Visit: Payer: Self-pay | Admitting: Internal Medicine

## 2013-05-09 ENCOUNTER — Other Ambulatory Visit: Payer: Self-pay | Admitting: Internal Medicine

## 2013-05-16 ENCOUNTER — Other Ambulatory Visit: Payer: Self-pay | Admitting: Family Medicine

## 2013-05-19 ENCOUNTER — Telehealth: Payer: Self-pay | Admitting: *Deleted

## 2013-05-19 DIAGNOSIS — M25561 Pain in right knee: Secondary | ICD-10-CM

## 2013-05-19 MED ORDER — TRAMADOL HCL 50 MG PO TABS: 50.0000 mg | ORAL_TABLET | Freq: Three times a day (TID) | ORAL | Status: DC | PRN

## 2013-05-19 NOTE — Telephone Encounter (Signed)
Pt notified. rx faxed to [facility] [address]

## 2013-05-19 NOTE — Telephone Encounter (Signed)
Requesting Tramadol 50mg -Take 1 tablet by mouth every 8 hours as needed. Last refill:03-24-13;#30,0 Last OV:07-27-13 UDS:Not on file Please advise.//AB/CMA

## 2013-05-19 NOTE — Telephone Encounter (Signed)
Waiting approval by Dr. Paz.//AB/CMA 

## 2013-05-19 NOTE — Telephone Encounter (Signed)
already approved

## 2013-05-19 NOTE — Telephone Encounter (Signed)
This is a Dr Larose Kells pt

## 2013-05-19 NOTE — Telephone Encounter (Signed)
Requesting Tramadol 50mg -Take 1 tablet by mouth every 8 hours as needed. Last refill:03-24-13;#30,0 Last OV:07-27-12 UDS:Not one on file Please advise.//AB/CMA

## 2013-05-19 NOTE — Telephone Encounter (Signed)
Dr. Paz pt 

## 2013-05-19 NOTE — Telephone Encounter (Signed)
Ok tramadol  #30, no refills. Due for a CPX next month, please arrange

## 2013-06-08 ENCOUNTER — Other Ambulatory Visit: Payer: Self-pay | Admitting: Internal Medicine

## 2013-06-22 ENCOUNTER — Telehealth: Payer: Self-pay | Admitting: *Deleted

## 2013-06-22 NOTE — Telephone Encounter (Signed)
Medication List and Allergies:Reviewed and updated  90 Day supply/Mail order: N/A Local pharmacy:[facility] [address] Immunizations Due: Pna/ Zoster  A/P FH/PSH or Personal History: Reviewed and updated Flu Vaccine: 10/2012 Tdap: 05/2008 QGB:EEFEOF if she had had this Shingles: Never CCS:06/2006, stomach ulcers repeat in 10 years (By Jackson Latino) MMG: 09/2011 Normal   BD: Never  To discuss with provider: Not at this time

## 2013-06-24 ENCOUNTER — Ambulatory Visit (INDEPENDENT_AMBULATORY_CARE_PROVIDER_SITE_OTHER): Payer: BC Managed Care – PPO | Admitting: Internal Medicine

## 2013-06-24 ENCOUNTER — Encounter: Payer: Self-pay | Admitting: Internal Medicine

## 2013-06-24 VITALS — BP 116/81 | HR 87 | Temp 98.0°F | Ht 67.0 in | Wt 281.0 lb

## 2013-06-24 DIAGNOSIS — M545 Low back pain, unspecified: Secondary | ICD-10-CM

## 2013-06-24 DIAGNOSIS — F32A Depression, unspecified: Secondary | ICD-10-CM

## 2013-06-24 DIAGNOSIS — Z0000 Encounter for general adult medical examination without abnormal findings: Secondary | ICD-10-CM

## 2013-06-24 DIAGNOSIS — Z0279 Encounter for issue of other medical certificate: Secondary | ICD-10-CM

## 2013-06-24 DIAGNOSIS — E669 Obesity, unspecified: Secondary | ICD-10-CM

## 2013-06-24 DIAGNOSIS — F329 Major depressive disorder, single episode, unspecified: Secondary | ICD-10-CM

## 2013-06-24 LAB — BASIC METABOLIC PANEL
BUN: 20 mg/dL (ref 6–23)
CO2: 27 meq/L (ref 19–32)
CREATININE: 0.9 mg/dL (ref 0.4–1.2)
Calcium: 9.1 mg/dL (ref 8.4–10.5)
Chloride: 103 mEq/L (ref 96–112)
GFR: 65.26 mL/min (ref >60.00)
Glucose, Bld: 86 mg/dL (ref 70–99)
Potassium: 3.6 mEq/L (ref 3.5–5.1)
Sodium: 139 mEq/L (ref 135–145)

## 2013-06-24 LAB — CBC WITH DIFFERENTIAL/PLATELET
BASOS ABS: 0 10*3/uL (ref 0.0–0.1)
Basophils Relative: 0.8 % (ref 0.0–3.0)
EOS ABS: 0.1 10*3/uL (ref 0.0–0.7)
Eosinophils Relative: 2.2 % (ref 0.0–5.0)
HCT: 39.2 % (ref 36.0–46.0)
HEMOGLOBIN: 13 g/dL (ref 12.0–15.0)
LYMPHS PCT: 45.9 % (ref 12.0–46.0)
Lymphs Abs: 2.7 10*3/uL (ref 0.7–4.0)
MCHC: 33.2 g/dL (ref 30.0–36.0)
MCV: 87.6 fl (ref 78.0–100.0)
Monocytes Absolute: 0.3 10*3/uL (ref 0.1–1.0)
Monocytes Relative: 5.4 % (ref 3.0–12.0)
NEUTROS ABS: 2.7 10*3/uL (ref 1.4–7.7)
Neutrophils Relative %: 45.7 % (ref 43.0–77.0)
PLATELETS: 260 10*3/uL (ref 150.0–400.0)
RBC: 4.47 Mil/uL (ref 3.87–5.11)
RDW: 13.6 % (ref 11.5–15.5)
WBC: 6 10*3/uL (ref 4.0–10.5)

## 2013-06-24 LAB — HEPATIC FUNCTION PANEL
ALBUMIN: 3.9 g/dL (ref 3.5–5.2)
ALT: 17 U/L (ref 0–35)
AST: 20 U/L (ref 0–37)
Alkaline Phosphatase: 92 U/L (ref 39–117)
Bilirubin, Direct: 0 mg/dL (ref 0.0–0.3)
TOTAL PROTEIN: 7.4 g/dL (ref 6.0–8.3)
Total Bilirubin: 0.8 mg/dL (ref 0.2–1.2)

## 2013-06-24 LAB — LIPID PANEL
CHOLESTEROL: 230 mg/dL — AB (ref 0–200)
HDL: 63.8 mg/dL (ref >39.00)
LDL Cholesterol: 152 mg/dL — ABNORMAL HIGH (ref 0–99)
NonHDL: 166.2
TRIGLYCERIDES: 72 mg/dL (ref 0.0–149.0)
Total CHOL/HDL Ratio: 4
VLDL: 14.4 mg/dL (ref 0.0–40.0)

## 2013-06-24 MED ORDER — PHENTERMINE-TOPIRAMATE ER 3.75-23 MG PO CP24: ORAL_CAPSULE | ORAL | Status: DC

## 2013-06-24 NOTE — Assessment & Plan Note (Addendum)
Symptoms are still there but currently stable. Current Rx is: 1. gabapentin helps, prescribed by her back doctors. 2.  Ultram as needed, will call for Rfs, needs a  UDS 3. Local shots prn

## 2013-06-24 NOTE — Progress Notes (Signed)
Subjective:    Patient ID: Taylor Silva, female    DOB: February 17, 1955, 58 y.o.   MRN: [id number]  DOS:  06/24/2013 Type of  Visit: CPX History: We addressed other issues, see assessment and plan   ROS No  CP, SOB Denies  nausea, vomiting diarrhea, blood in the stools (-) cough, sputum production (-) wheezing, chest congestion  No dysuria, gross hematuria, difficulty urinating   on welbutrin , sx not well controlled  Denies suicidal ideas      Past Medical History  Diagnosis Date  . Fructose intolerance   . Arthritis   . Seasonal allergies   . Restless legs   . Depression   . Hyperlipidemia   . Heart murmur   . Sleep apnea     Intolerant to CPAP  . Obesity     Past Surgical History  Procedure Laterality Date  . Knee surgery  04/2009    right knee, meniscus tear  . Wrist surgery  1980s    cyst removed from right wrist  . Abdominal hysterectomy  1995    No oophorectomy  . Other surgical history  1995?    removed scar tissue from colon    History   Social History  . Marital Status: Married    Spouse Name: N/A    Number of Children: 1  . 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 Smoking status: Never Smoker   . Smokeless tobacco: Never Used  . Alcohol Use: No  . Drug Use: No  . Sexual Activity: Not on file   Other Topics Concern  . Not on file   Social History Narrative   Lives with husband who is transplant patient   Completed some college                 Family History  Problem Relation Age of Onset  . Diabetes Mother   . Hyperlipidemia Mother   . Hypertension Mother   . Heart attack Mother     53 y/o  . Diabetes Sister   . Hyperlipidemia Sister   . Hypertension Sister   . Diabetes Brother   . Hyperlipidemia Brother   . Hypertension Brother   . Heart attack Brother   . Diabetes Maternal Grandmother   . Hyperlipidemia Maternal Grandmother   .  Hypertension Maternal Grandmother   . Heart disease Father   . Leukemia Father   . Colon cancer Neg Hx   . Breast cancer Other     GM      Medication List       This list is accurate as of: 06/24/13 11:59 PM.  Always use your most recent med list.               aspirin-acetaminophen-caffeine 250-250-65 MG per tablet  Commonly known as:  EXCEDRIN MIGRAINE  Take 1 tablet by mouth every 6 (six) hours as needed. headache     buPROPion 75 MG tablet  Commonly known as:  WELLBUTRIN  TAKE ONE TABLET BY MOUTH TWICE DAILY     Calcium-Vitamin D 600-200 MG-UNIT per tablet  Take 2 tablets by mouth daily.     cetirizine 10 MG tablet  Commonly known as:  ZYRTEC  Take 10 mg by mouth daily.     diphenhydramine-acetaminophen 25-500 MG Tabs  Commonly known as:  TYLENOL PM  Take 1 tablet by mouth at bedtime as needed.  fish oil-omega-3 fatty acids 1000 MG capsule  Take 1,000 mg by mouth daily.     fluticasone 50 MCG/ACT nasal spray  Commonly known as:  FLONASE  2 sprays in each side of nose daily     GABAPENTIN PO  Take by mouth. Per spine specialist     hydrochlorothiazide 12.5 MG capsule  Commonly known as:  MICROZIDE  Take 1 capsule (12.5 mg total) by mouth daily.     multivitamin with minerals tablet  Take 1 tablet by mouth daily.     NON FORMULARY  2 tablets daily. Osteo-biflex triple strength     pantoprazole 40 MG tablet  Commonly known as:  PROTONIX  Take 40 mg by mouth daily.     Phentermine-Topiramate 3.75-23 MG Cp24  Commonly known as:  QSYMIA  One tablet daily for 2 weeks, then increase to 2 tablets a day     rOPINIRole 0.5 MG tablet  Commonly known as:  REQUIP  Take two tablets by mouth at bedtime.     traMADol 50 MG tablet  Commonly known as:  ULTRAM  Take 1 tablet (50 mg total) by mouth every 8 (eight) hours as needed.     VITAMIN D (CHOLECALCIFEROL) PO  Take 5,000 Units by mouth. Vitamin d 3           Objective:   Physical Exam BP 116/81   Pulse 87  Temp(Src) 98 F (36.7 C)  Ht 5\' 7"  (1.702 m)  Wt 281 lb (127.461 kg)  BMI 44.00 kg/m2  SpO2 94% General -- alert, well-developed, NAD.  Neck --no thyromegaly  HEENT-- Not pale. Lungs -- normal respiratory effort, no intercostal retractions, no accessory muscle use, and normal breath sounds.  Heart-- normal rate, regular rhythm, no murmur.  Abdomen-- Not distended, good bowel sounds,soft, non-tender. Extremities-- no pretibial edema bilaterally  Neurologic--  alert & oriented X3. Speech normal, gait appropriate for age, strength symmetric and appropriate for age.   Psych-- Cognition and judgment appear intact. Cooperative with normal attention span and concentration. No anxious or depressed appearing.         Assessment & Plan:    In addition to CPX we spent > 15 min   counseling regards medication treatment for  obesity

## 2013-06-24 NOTE — Assessment & Plan Note (Signed)
Not completely satisfied with Wellbutrin treatment, somehow reluctant to see psychiatry.

## 2013-06-24 NOTE — Assessment & Plan Note (Addendum)
She tried to [facility], was able to lose 40 pounds at the time (in the context of a recent visit to a nutritionist) After six-weeks [facility]  "stopped working" and she regained weight. We had extensive discussion about the need to exercise but is somehow limited by pain. She admits to  " stress eating"  Eventually we agreed to Qsymia, She is doing Weight Watchers and I again encouraged to stay active

## 2013-06-24 NOTE — Patient Instructions (Signed)
Get your blood work before you leave  Also need a urine sample     Next visit is for routine check up in 2 months

## 2013-06-24 NOTE — Assessment & Plan Note (Addendum)
Tdap 2010  Reports a colonoscopy in 2008 with Dr. Oletta Lamas,  records reviewed, next 2018  Sees  gynecology   Due for a mammogram, last 09-2011, not interested at this time, rec d/w gyn She has a positive family history of heart disease  Labs Diet exercise discussed extensively  Ordered  DEXA before , ordering one again today

## 2013-06-24 NOTE — Progress Notes (Signed)
Pre visit review using our clinic review tool, if applicable. No additional management support is needed unless otherwise documented below in the visit note. 

## 2013-06-25 ENCOUNTER — Telehealth: Payer: Self-pay | Admitting: Internal Medicine

## 2013-06-25 DIAGNOSIS — F329 Major depressive disorder, single episode, unspecified: Secondary | ICD-10-CM

## 2013-06-25 DIAGNOSIS — F32A Depression, unspecified: Secondary | ICD-10-CM

## 2013-06-25 DIAGNOSIS — J302 Other seasonal allergic rhinitis: Secondary | ICD-10-CM

## 2013-06-25 DIAGNOSIS — I10 Essential (primary) hypertension: Secondary | ICD-10-CM

## 2013-06-27 ENCOUNTER — Telehealth: Payer: Self-pay | Admitting: *Deleted

## 2013-06-27 DIAGNOSIS — M25561 Pain in right knee: Secondary | ICD-10-CM

## 2013-06-27 MED ORDER — TRAMADOL HCL 50 MG PO TABS: 50.0000 mg | ORAL_TABLET | Freq: Three times a day (TID) | ORAL | Status: DC | PRN

## 2013-06-27 NOTE — Telephone Encounter (Signed)
rx refill- Tramadol 50mg   Last OV- 06/24/13 Last refilled- 05/19/13 # 30 / 0 rf  UDS- none.

## 2013-06-27 NOTE — Telephone Encounter (Signed)
rx faxed to- [facility].

## 2013-06-27 NOTE — Telephone Encounter (Signed)
Pt states none of her meds were refilled at last office visit, requesting to speak with nurse.

## 2013-06-28 ENCOUNTER — Other Ambulatory Visit: Payer: Self-pay | Admitting: *Deleted

## 2013-06-28 DIAGNOSIS — K219 Gastro-esophageal reflux disease without esophagitis: Secondary | ICD-10-CM

## 2013-06-28 MED ORDER — PANTOPRAZOLE SODIUM 40 MG PO TBEC: 40.0000 mg | DELAYED_RELEASE_TABLET | Freq: Every day | ORAL | Status: DC

## 2013-06-28 MED ORDER — HYDROCHLOROTHIAZIDE 12.5 MG PO CAPS: 12.5000 mg | ORAL_CAPSULE | Freq: Every day | ORAL | Status: DC

## 2013-06-28 MED ORDER — FLUTICASONE PROPIONATE 50 MCG/ACT NA SUSP: NASAL | Status: DC

## 2013-06-28 MED ORDER — BUPROPION HCL 75 MG PO TABS: ORAL_TABLET | ORAL | Status: DC

## 2013-06-28 NOTE — Telephone Encounter (Signed)
Refill for protonoix sent to [facility] on [address] per pt request

## 2013-06-28 NOTE — Telephone Encounter (Signed)
Refills for Flonase, Wellbutrin and HCTZ sent to [facility] on [address]

## 2013-06-29 ENCOUNTER — Telehealth: Payer: Self-pay | Admitting: *Deleted

## 2013-06-29 NOTE — Telephone Encounter (Signed)
[employer] representative called and asked if the prior authorization for Phentermine-Topiramate (QSYMIA) 3.75-23 MG CP24 had been started.  I found the fax from [facility] and told her we would try to get it started this afternoon, if not it would be tomorrow.    She stated she would let the pt know because the pt was trying to pick up her medication.  bw

## 2013-06-30 NOTE — Telephone Encounter (Signed)
noted 

## 2013-07-01 ENCOUNTER — Telehealth: Payer: Self-pay | Admitting: *Deleted

## 2013-07-01 ENCOUNTER — Other Ambulatory Visit: Payer: Self-pay | Admitting: *Deleted

## 2013-07-01 DIAGNOSIS — K219 Gastro-esophageal reflux disease without esophagitis: Secondary | ICD-10-CM

## 2013-07-01 MED ORDER — PANTOPRAZOLE SODIUM 40 MG PO TBEC: 40.0000 mg | DELAYED_RELEASE_TABLET | Freq: Every day | ORAL | Status: DC

## 2013-07-01 NOTE — Telephone Encounter (Signed)
UDS LOW risk- 06/24/13 @ DR.PAZ

## 2013-07-04 ENCOUNTER — Telehealth: Payer: Self-pay | Admitting: *Deleted

## 2013-07-04 NOTE — Telephone Encounter (Signed)
Error//AB/CMA 

## 2013-07-05 NOTE — Telephone Encounter (Signed)
Noted  

## 2013-07-05 NOTE — Telephone Encounter (Signed)
Received completed Prior Review/Certification Faxback Form from [facility].  Faxed completed form to [employer] [place] at [phone].  Sent original to order level to be scanned in pt chart.  Attached copy to billing form and sent to Deirdre Peer at [facility].  bw

## 2013-07-05 NOTE — Telephone Encounter (Signed)
LM requesting waist circumference. Advised to [facility]

## 2013-07-05 NOTE — Telephone Encounter (Signed)
Pt returned call  Waist circumference is Around 46"  Please call back if needed; or forward for PA

## 2013-07-06 NOTE — Telephone Encounter (Signed)
Received Authorization Notification paperwork via fax from [employer] [place].  Stating Qsymia was approved.  Reference number:[id number],effective dates of the authorization:07/05/13-12/31/13.   Pharmacy was given information and pt was called and informed that rx was approved and her co-pay would be $55.00.   Pt understood and agreed.//AB/CMA

## 2013-07-07 ENCOUNTER — Encounter: Payer: Self-pay | Admitting: Internal Medicine

## 2013-07-29 ENCOUNTER — Ambulatory Visit (INDEPENDENT_AMBULATORY_CARE_PROVIDER_SITE_OTHER)
Admission: RE | Admit: 2013-07-29 | Discharge: 2013-07-29 | Disposition: A | Discharge status: Home / Self Care | Payer: BC Managed Care – PPO | Source: Ambulatory Visit | Attending: Internal Medicine | Admitting: Internal Medicine

## 2013-07-29 DIAGNOSIS — Z0000 Encounter for general adult medical examination without abnormal findings: Secondary | ICD-10-CM

## 2013-08-02 ENCOUNTER — Telehealth: Payer: Self-pay

## 2013-08-02 ENCOUNTER — Encounter: Payer: Self-pay | Admitting: *Deleted

## 2013-08-02 ENCOUNTER — Ambulatory Visit (INDEPENDENT_AMBULATORY_CARE_PROVIDER_SITE_OTHER): Payer: BC Managed Care – PPO | Admitting: Nurse Practitioner

## 2013-08-02 ENCOUNTER — Encounter: Payer: Self-pay | Admitting: Nurse Practitioner

## 2013-08-02 VITALS — BP 120/82 | HR 85 | Temp 98.8°F | Ht 67.0 in | Wt 267.0 lb

## 2013-08-02 DIAGNOSIS — H9203 Otalgia, bilateral: Secondary | ICD-10-CM

## 2013-08-02 DIAGNOSIS — E748 Other specified disorders of carbohydrate metabolism: Secondary | ICD-10-CM

## 2013-08-02 DIAGNOSIS — R51 Headache: Secondary | ICD-10-CM | POA: Insufficient documentation

## 2013-08-02 DIAGNOSIS — E7410 Disorder of fructose metabolism, unspecified: Secondary | ICD-10-CM

## 2013-08-02 DIAGNOSIS — H9209 Otalgia, unspecified ear: Secondary | ICD-10-CM

## 2013-08-02 DIAGNOSIS — R519 Headache, unspecified: Secondary | ICD-10-CM | POA: Insufficient documentation

## 2013-08-02 DIAGNOSIS — E7489 Other specified disorders of carbohydrate metabolism: Secondary | ICD-10-CM

## 2013-08-02 DIAGNOSIS — J310 Chronic rhinitis: Secondary | ICD-10-CM | POA: Insufficient documentation

## 2013-08-02 MED ORDER — KETOROLAC TROMETHAMINE 30 MG/ML IJ SOLN
30.0000 mg | Freq: Once | INTRAMUSCULAR | Status: AC
  Administered 2013-08-02: 30 mg via INTRAVENOUS

## 2013-08-02 MED ORDER — KETOROLAC TROMETHAMINE 30 MG/ML IM SOLN: 30.0000 mg | Freq: Once | INTRAMUSCULAR | Status: DC

## 2013-08-02 NOTE — Patient Instructions (Signed)
Please see allergist and/or Dr Lollie Sails with [facility].  I think you have chronic allergies-perhaps food & environmental that cause chronic rhinitis & headache.  Start sinus rinses daily to decrease environmental allergenic load (Neilmed sinus rinse).  Aldolase B is the enzyme missing in people who have hereditary fructose intolerance. Continue to avoid foods that have these ingredients listed: high fructose syrups, corn syrups, honey, molasses, sucrose. Higher fructose foods are: watermelon, grapes, processed fruit juice, peas, asparagus, zucchini-avoid these.  Foods lower in fructose that you may be able to tolerated in limited quantities are: bananas, strawberries, blueberries, carrots, lettuce, green beans, avacados.

## 2013-08-02 NOTE — Assessment & Plan Note (Signed)
Feels frustrated with limited food choices, so "sort of given up".  Reviewed foods high in fructose & those lower in fructose-gave list.  Discussed potential complications of not avoiding or minimizing exposure to fructose: gout, liver problems, stomach upset. Consider seeking integrative center for best management: gave info for [facility].

## 2013-08-02 NOTE — Telephone Encounter (Signed)
Patient called with complaints of dizziness.

## 2013-08-02 NOTE — Assessment & Plan Note (Signed)
Likely r/t food & environmental allergies. Using flonase daily. Zyrtec most days. Start sinus rinse daily. Ref to allergist for allergy testing to avoid triggers.

## 2013-08-02 NOTE — Telephone Encounter (Signed)
C/O:  Intermittent dizziness.  Bilateral ear pain, pressure and fullness, mainly left ear.  Rated pain 6/10.  Describes it as sharp pains that travel down left side of neck.  Started last Friday.  Worsen ringing in ears.   Nasal congestion.  Low grade temp off and on.  Sore throat last week, no complaints. Thinks symptoms are sinus related.    Takes Zyrtec daily and has taken tylenol and Flonase, no relief.   2 days out of work.  Would like to be seen by a provider.  Similar symptoms back in March.  Was seen by Elyn Aquas on 03/18/13 for Acute Sinusitis.    Appointment scheduled today with Nicky Pugh at 1100 at the [facility] office.

## 2013-08-02 NOTE — Assessment & Plan Note (Signed)
Frequent HA associated w/otalgia, sinus stuffiness. Relieved by excedrin migraine. Toradol IM in ofc today. OK to excedrin with food at home. Ref to allergist for environmental & food testing-likely has multiple HA triggers.

## 2013-08-02 NOTE — Progress Notes (Signed)
   Subjective:    Patient ID: Taylor Silva, female    DOB: 1955/09/12, 58 y.o.   MRN: [id number]  Otalgia  There is pain in both ears. This is a recurrent problem. The current episode started in the past 7 days. The problem occurs constantly. The problem has been waxing and waning. There has been no fever. The pain is moderate. Associated symptoms include headaches, hearing loss and rhinorrhea. Pertinent negatives include no abdominal pain, coughing, diarrhea, drainage, sore throat or vomiting. Associated symptoms comments: Nasal stuffiness.. She has tried NSAIDs (sudafed, flonase, zyrtec) for the symptoms. The treatment provided no relief. Her past medical history is significant for a chronic ear infection and hearing loss. aldolase B deficicency/fructose intolerance for which she feels frustrated because she doesn't know what to eat, feels very limited. Chronic allergies-poor control. Weight gain.       Review of Systems  Constitutional: Positive for fatigue. Negative for fever and chills.  HENT: Positive for congestion, ear pain, hearing loss, postnasal drip, rhinorrhea and sinus pressure. Negative for sore throat and voice change.   Respiratory: Negative for cough, shortness of breath and wheezing.   Cardiovascular: Negative for chest pain.  Gastrointestinal: Negative for vomiting, abdominal pain and diarrhea.  Musculoskeletal: Positive for arthralgias.  Neurological: Positive for headaches.       Objective:   Physical Exam  Vitals reviewed. Constitutional: She is oriented to person, place, and time. She appears well-developed and well-nourished. No distress.  HENT:  Head: Normocephalic and atraumatic.  Right Ear: External ear normal.  Left Ear: External ear normal.  Mouth/Throat: Oropharynx is clear and moist. No oropharyngeal exudate.  Bones visible bilat, no bulge or retraction of TM, no fluid.  Eyes: Conjunctivae are normal. Right eye exhibits no discharge. Left eye  exhibits no discharge.  Neck: Normal range of motion. Neck supple. No thyromegaly present.  Cardiovascular: Normal rate.   Pulmonary/Chest: Effort normal. No respiratory distress.  Lymphadenopathy:    She has no cervical adenopathy.  Neurological: She is alert and oriented to person, place, and time.  Skin: Skin is warm and dry.  Psychiatric: She has a normal mood and affect. Her behavior is normal. Thought content normal.          Assessment & Plan:  1. Chronic rhinitis Likely has multiple allergy triggers: food & environmental - Ambulatory referral to Allergy Start daily sinus rinses.  2. Headache(784.0) Likely r/t allergy triggers &/or food sensitivities  - Ambulatory referral to Allergy - ketorolac (TORADOL) 30 MG/ML injection 30 mg; Inject 1 mL (30 mg total) into the vein once. Excedrin PRN  3. Otalgia of both ears Likely r/t poor control of allergies. Saw multiple ENt in past.  4. Fructose malabsorption Gave list of foods to avoid & foods to limit. Potential complications: gout & liver disease. Consider integrative center.  See pt instructions.

## 2013-08-02 NOTE — Progress Notes (Signed)
Pre visit review using our clinic review tool, if applicable. No additional management support is needed unless otherwise documented below in the visit note. 

## 2013-08-23 ENCOUNTER — Other Ambulatory Visit: Payer: Self-pay | Admitting: Internal Medicine

## 2013-08-24 ENCOUNTER — Telehealth: Payer: Self-pay

## 2013-08-24 DIAGNOSIS — M25561 Pain in right knee: Secondary | ICD-10-CM

## 2013-08-24 MED ORDER — TRAMADOL HCL 50 MG PO TABS: 50.0000 mg | ORAL_TABLET | Freq: Three times a day (TID) | ORAL | Status: DC | PRN

## 2013-08-24 MED ORDER — ROPINIROLE HCL 0.5 MG PO TABS: ORAL_TABLET | ORAL | Status: DC

## 2013-08-24 NOTE — Telephone Encounter (Signed)
done

## 2013-08-24 NOTE — Telephone Encounter (Signed)
Pt requesting refills on Tramadol and Requip.  Last OV: 06/24/2013 Last UDS: 06/24/2013: Low Risk  Last Fill on Tramadol: 06/27/2013 Last Fill on Requip: 06/10/2013  Please Advise.

## 2013-08-24 NOTE — Telephone Encounter (Signed)
Faxed medication to [facility].

## 2013-08-26 ENCOUNTER — Ambulatory Visit: Payer: Self-pay | Admitting: Internal Medicine

## 2013-10-08 ENCOUNTER — Emergency Department (HOSPITAL_BASED_OUTPATIENT_CLINIC_OR_DEPARTMENT_OTHER): Payer: BC Managed Care – PPO

## 2013-10-08 ENCOUNTER — Emergency Department (HOSPITAL_BASED_OUTPATIENT_CLINIC_OR_DEPARTMENT_OTHER)
Admission: EM | Admit: 2013-10-08 | Discharge: 2013-10-08 | Disposition: A | Discharge status: Home / Self Care | Payer: BC Managed Care – PPO | Attending: Emergency Medicine | Admitting: Emergency Medicine

## 2013-10-08 ENCOUNTER — Encounter (HOSPITAL_BASED_OUTPATIENT_CLINIC_OR_DEPARTMENT_OTHER): Payer: Self-pay | Admitting: Emergency Medicine

## 2013-10-08 DIAGNOSIS — M1990 Unspecified osteoarthritis, unspecified site: Secondary | ICD-10-CM | POA: Diagnosis not present

## 2013-10-08 DIAGNOSIS — R011 Cardiac murmur, unspecified: Secondary | ICD-10-CM | POA: Diagnosis not present

## 2013-10-08 DIAGNOSIS — S82401A Unspecified fracture of shaft of right fibula, initial encounter for closed fracture: Secondary | ICD-10-CM

## 2013-10-08 DIAGNOSIS — Z8639 Personal history of other endocrine, nutritional and metabolic disease: Secondary | ICD-10-CM | POA: Diagnosis not present

## 2013-10-08 DIAGNOSIS — Z7951 Long term (current) use of inhaled steroids: Secondary | ICD-10-CM | POA: Insufficient documentation

## 2013-10-08 DIAGNOSIS — S82891A Other fracture of right lower leg, initial encounter for closed fracture: Secondary | ICD-10-CM | POA: Insufficient documentation

## 2013-10-08 DIAGNOSIS — S99911A Unspecified injury of right ankle, initial encounter: Secondary | ICD-10-CM | POA: Diagnosis present

## 2013-10-08 DIAGNOSIS — Y9389 Activity, other specified: Secondary | ICD-10-CM | POA: Insufficient documentation

## 2013-10-08 DIAGNOSIS — Y9289 Other specified places as the place of occurrence of the external cause: Secondary | ICD-10-CM | POA: Insufficient documentation

## 2013-10-08 DIAGNOSIS — G2581 Restless legs syndrome: Secondary | ICD-10-CM | POA: Diagnosis not present

## 2013-10-08 DIAGNOSIS — W109XXA Fall (on) (from) unspecified stairs and steps, initial encounter: Secondary | ICD-10-CM | POA: Insufficient documentation

## 2013-10-08 DIAGNOSIS — Z79899 Other long term (current) drug therapy: Secondary | ICD-10-CM | POA: Insufficient documentation

## 2013-10-08 DIAGNOSIS — F329 Major depressive disorder, single episode, unspecified: Secondary | ICD-10-CM | POA: Insufficient documentation

## 2013-10-08 DIAGNOSIS — G4730 Sleep apnea, unspecified: Secondary | ICD-10-CM | POA: Diagnosis not present

## 2013-10-08 DIAGNOSIS — W19XXXA Unspecified fall, initial encounter: Secondary | ICD-10-CM

## 2013-10-08 DIAGNOSIS — E669 Obesity, unspecified: Secondary | ICD-10-CM | POA: Diagnosis not present

## 2013-10-08 MED ORDER — HYDROCODONE-ACETAMINOPHEN 5-325 MG PO TABS: 1.0000 | ORAL_TABLET | Freq: Four times a day (QID) | ORAL | Status: DC | PRN

## 2013-10-08 MED ORDER — HYDROCODONE-ACETAMINOPHEN 5-325 MG PO TABS
1.0000 | ORAL_TABLET | Freq: Once | ORAL | Status: AC
  Administered 2013-10-08: 1 via ORAL
  Filled 2013-10-08: qty 1

## 2013-10-08 NOTE — ED Provider Notes (Signed)
CSN: [id number]     Arrival date & time 10/08/13  0900 History   First MD Initiated Contact with Patient 10/08/13 [phone]     Chief Complaint  Patient presents with  . Ankle Injury     (Consider location/radiation/quality/duration/timing/severity/associated sxs/prior Treatment) HPI Comments: 58 year old female presenting after twisting her right ankle trying to catch herself from falling down the stairs. She also complains of right knee pain. She denies any other injuries from this fall. She did not lose consciousness or hit her head.   Patient is a 58 y.o. female presenting with lower extremity injury.  Ankle Injury This is a new problem. Episode onset: 8 hours ago. The problem occurs constantly. The problem has not changed since onset.Pertinent negatives include no chest pain, no abdominal pain, no headaches and no shortness of breath. The symptoms are aggravated by standing and twisting. The symptoms are relieved by rest and position. She has tried nothing for the symptoms.    Past Medical History  Diagnosis Date  . Fructose intolerance   . Arthritis   . Seasonal allergies   . Restless legs   . Depression   . Hyperlipidemia   . Heart murmur   . Sleep apnea     Intolerant to CPAP  . Obesity    Past Surgical History  Procedure Laterality Date  . Knee surgery  04/2009    right knee, meniscus tear  . Wrist surgery  1980s    cyst removed from right wrist  . Abdominal hysterectomy  1995    No oophorectomy  . Other surgical history  1995?    removed scar tissue from colon   Family History  Problem Relation Age of Onset  . Diabetes Mother   . Hyperlipidemia Mother   . Hypertension Mother   . Heart attack Mother     8 y/o  . Diabetes Sister   . Hyperlipidemia Sister   . Hypertension Sister   . Diabetes Brother   . Hyperlipidemia Brother   . Hypertension Brother   . Heart attack Brother   . Diabetes Maternal Grandmother   . Hyperlipidemia Maternal Grandmother   .  Hypertension Maternal Grandmother   . Heart disease Father   . Leukemia Father   . Colon cancer Neg Hx   . Breast cancer Other     GM   History  Substance Use Topics  . Smoking status: Never Smoker   . Smokeless tobacco: Never Used  . Alcohol Use: No   OB History   Grav Para Term Preterm Abortions TAB SAB Ect Mult Living                 Review of Systems  Respiratory: Negative for shortness of breath.   Cardiovascular: Negative for chest pain.  Gastrointestinal: Negative for abdominal pain.  Neurological: Negative for headaches.  All other systems reviewed and are negative.     Allergies  Codeine; Erythromycin; Tetracyclines & related; Penicillins; and Sulfa antibiotics  Home Medications   Prior to Admission medications   Medication Sig Start Date End Date Taking? Authorizing Provider  albuterol (ACCUNEB) 0.63 MG/3ML nebulizer solution Take 1 ampule by nebulization every 6 (six) hours as needed for wheezing.   Yes Historical Provider, MD  montelukast (SINGULAIR) 10 MG tablet Take 10 mg by mouth at bedtime.   Yes Historical Provider, MD  aspirin-acetaminophen-caffeine (EXCEDRIN MIGRAINE) [id number] MG per tablet Take 1 tablet by mouth every 6 (six) hours as needed. headache    Historical Provider, MD  buPROPion (WELLBUTRIN) 75 MG tablet TAKE ONE TABLET BY MOUTH TWICE DAILY 06/28/13   Colon Branch, MD  Calcium-Vitamin D 600-200 MG-UNIT per tablet Take 2 tablets by mouth daily.    Historical Provider, MD  cetirizine (ZYRTEC) 10 MG tablet Take 10 mg by mouth daily.    Historical Provider, MD  diphenhydramine-acetaminophen (TYLENOL PM) 25-500 MG TABS Take 1 tablet by mouth at bedtime as needed.    Historical Provider, MD  fish oil-omega-3 fatty acids 1000 MG capsule Take 1,000 mg by mouth daily.      Historical Provider, MD  fluticasone Asencion Islam) 50 MCG/ACT nasal spray 2 sprays in each side of nose daily 06/28/13   Colon Branch, MD  GABAPENTIN PO Take by mouth. Per spine specialist     Historical Provider, MD  hydrochlorothiazide (MICROZIDE) 12.5 MG capsule Take 1 capsule (12.5 mg total) by mouth daily. 06/28/13   Colon Branch, MD  Multiple Vitamins-Minerals (MULTIVITAMIN WITH MINERALS) tablet Take 1 tablet by mouth daily.      Historical Provider, MD  NON FORMULARY 2 tablets daily. Osteo-biflex triple strength    Historical Provider, MD  pantoprazole (PROTONIX) 40 MG tablet Take 1 tablet (40 mg total) by mouth daily. 07/01/13   Colon Branch, MD  Phentermine-Topiramate [facility]) 3.75-23 MG CP24 One tablet daily for 2 weeks, then increase to 2 tablets a day 06/24/13   Colon Branch, MD  rOPINIRole (REQUIP) 0.5 MG tablet Take two tablets by mouth at bedtime. 08/24/13   Colon Branch, MD  traMADol (ULTRAM) 50 MG tablet Take 1 tablet (50 mg total) by mouth every 8 (eight) hours as needed. 08/24/13   Colon Branch, MD  VITAMIN D, CHOLECALCIFEROL, PO Take 5,000 Units by mouth. Vitamin d 3    Historical Provider, MD   BP 131/79  Pulse 97  Temp(Src) 98.9 F (37.2 C) (Oral)  Resp 18  Ht 5\' 6"  (1.676 m)  Wt 280 lb (127.007 kg)  BMI 45.21 kg/m2  SpO2 98% Physical Exam  Nursing note and vitals reviewed. Constitutional: She is oriented to person, place, and time. She appears well-developed and well-nourished. No distress.  HENT:  Head: Normocephalic and atraumatic. Head is without raccoon's eyes and without Battle's sign.  Nose: Nose normal.  Eyes: Conjunctivae and EOM are normal. Pupils are equal, round, and reactive to light. No scleral icterus.  Neck: No spinous process tenderness and no muscular tenderness present.  Cardiovascular: Normal rate, regular rhythm, normal heart sounds and intact distal pulses.   No murmur heard. Pulmonary/Chest: Effort normal and breath sounds normal. She has no rales. She exhibits no tenderness.  Abdominal: Soft. There is no tenderness. There is no rebound and no guarding.  Musculoskeletal: Normal range of motion. She exhibits no edema.       Right knee: She  exhibits normal range of motion, no swelling and no deformity. Tenderness found. Lateral joint line tenderness noted.       Right ankle: She exhibits swelling. She exhibits no deformity and normal pulse. Tenderness. Lateral malleolus tenderness found.       Thoracic back: She exhibits no tenderness and no bony tenderness.       Lumbar back: She exhibits no tenderness and no bony tenderness.  No evidence of trauma to extremities, except as noted.  2+ distal pulses.    Neurological: She is alert and oriented to person, place, and time.  Skin: Skin is warm and dry. No rash noted.  Psychiatric: She has  a normal mood and affect.    ED Course  Procedures (including critical care time) Labs Review Labs Reviewed - No data to display  Imaging Review Dg Tibia/fibula Right  10/08/2013   CLINICAL DATA:  Trauma and pain.  EXAM: RIGHT TIBIA AND FIBULA - 2 VIEW  COMPARISON:  Knee films of 09/22/2006  FINDINGS: Mild osteoarthritis about the knee. Minimally displaced distal fibular fracture with lateral displacement of distal fracture fragment. Posttraumatic. Favor nutrient foramen within the tibial shaft. Osseous irregularity about the medial malleolus is likely due to degenerative change or remote trauma. Small calcaneal spur.  IMPRESSION: Acute distal fibular fracture.   Electronically Signed   By: Abigail Miyamoto M.D.   On: 10/08/2013 10:42   Dg Ankle Complete Right  10/08/2013   CLINICAL DATA:  Patient tripped on the stairs earlier this morning landing on the right leg/ ankle. Pain radiating from the right knee to the right ankle with lateral ankle pain and swelling. Initial encounter.  EXAM: RIGHT ANKLE - COMPLETE 3+ VIEW  COMPARISON:  None.  FINDINGS: There is an oblique, intra-articular fracture of the distal fibula which demonstrates mild lateral displacement. There is a 4 mm ossicle at the tip of the medial malleolus. A small plantar calcaneal enthesophyte is noted. There is no dislocation. There is  prominent lateral ankle soft tissue swelling.  IMPRESSION: Mildly displaced distal fibular fracture. 4 mm ossicle at the tip of the medial malleolus suggestive of minimally displaced fracture of indeterminate acuity.   Electronically Signed   By: Logan Bores   On: 10/08/2013 10:39   Dg Knee Complete 4 Views Right  10/08/2013   CLINICAL DATA:  Status post fall last night with pain radiating from the right knee inferiorly; audible pop in the right knee ; history of previous knee surgery ; initial visit  EXAM: RIGHT KNEE - COMPLETE 4+ VIEW  COMPARISON:  Right knee series of September 22, 2006  FINDINGS: The bones of the knee are reasonably well mineralized for age. There is no lytic or blastic lesion. There is no acute fracture nor dislocation. There is beaking of the tibial spines. There are marginal osteophytes from the periphery of the femoral condyles and tibial plateaus. There spurs from the inferior margin of the patella. There is no joint effusion.  IMPRESSION: There are degenerative changes of the right knee. There is no acute fracture nor dislocation.   Electronically Signed   By: David  Martinique   On: 10/08/2013 10:41  All radiology studies independently viewed by me.      EKG Interpretation None      MDM   Final diagnoses:  Right fibular fracture, closed, initial encounter  Fall, initial encounter    58 yo female presenting after a fall.  She was at the top of the stairs, started to fall, then jerked herself back to the landing in order to prevent herself from falling down the stairs.  She twisted her right ankle in the process.  She came to the ED about 8 hours later when her pain persisted.  She denied other injuries including head trauma.  No LOC.  No indications for Neuroaxial imaging. No other evidence of injury on exam other than RLE.  Plain films show fibula fracture.  Placed in a splint.  She states she cannot tolerate crutches, but she thinks her sister has a wheelchair she can use.   Given Norco for pain (has tolerated this med in the past based on chart review and had  dose in ED without evidence of allergic reaction).  DC with ortho follow up.     Artis Delay, MD 10/08/13 1224

## 2013-10-08 NOTE — ED Notes (Signed)
appx 1:30am patient tripped own the stairs and landed on her right leg/ankle. Pain from outer ankle top mid calf.

## 2013-10-08 NOTE — ED Notes (Signed)
PT discharged to home with family. NAD. 

## 2013-10-08 NOTE — ED Notes (Signed)
Pt given ice pack for right ankle 

## 2013-10-08 NOTE — Discharge Instructions (Signed)

## 2013-10-12 ENCOUNTER — Other Ambulatory Visit: Payer: Self-pay | Admitting: Orthopedic Surgery

## 2013-10-12 DIAGNOSIS — S82899A Other fracture of unspecified lower leg, initial encounter for closed fracture: Secondary | ICD-10-CM | POA: Insufficient documentation

## 2013-10-13 ENCOUNTER — Encounter (HOSPITAL_COMMUNITY): Payer: Self-pay | Admitting: Pharmacy Technician

## 2013-10-13 NOTE — Pre-Procedure Instructions (Addendum)
Taylor Silva  10/13/2013   Your procedure is scheduled on:  Monday, October 17, 2013  Report to [facility] Entrance "A" [address] at [facility] AM.  Call this number if you have problems the morning of surgery: [phone]   Remember:   Do not eat food or drink liquids after midnight.   Take these medicines the morning of surgery with A SIP OF WATER: Use your inhaler(if needed), take Wellbutrin (bupropion), cetirizine (Zyrtec), Flonase (if needed), gabapentin (Neurontin), hydrocodone-acetaminophen (Norco/Vicodin--if needed), pantoprazole (Protonix), eye drops (if needed), tramadol (Ultram--if needed)elavil  STOP taking Aspirin, Goody's,excedrin, BC's, Aleve (Naproxen), Ibuprofen (Advil or Motrin), Fish Oil, Vitamins, Herbal Supplements( osteo biflex or any substance that could thin your blood starting today, October 14, 2013.  Continue to take all your other medicines as you normally do until day of surgery and then follow above instructions.   Do not wear jewelry, make-up or nail polish.  Do not wear lotions, powders, or perfumes. You may wear deodorant.  Do not shave 48 hours prior to surgery.  Do not bring valuables to the hospital.  [facility] is not responsible                  for any belongings or valuables.               Contacts, dentures or bridgework may not be worn into surgery.  Leave suitcase in the car. After surgery it may be brought to your room.  For patients admitted to the hospital, discharge time is determined by your                treatment team.               Patients discharged the day of surgery will not be allowed to drive  home.    Special Instructions:  Special Instructions: [place] - Preparing for Surgery  Before surgery, you can play an important role.  Because skin is not sterile, your skin needs to be as free of germs as possible.  You can reduce the number of germs on you skin by washing with CHG (chlorahexidine  gluconate) soap before surgery.  CHG is an antiseptic cleaner which kills germs and bonds with the skin to continue killing germs even after washing.  Please DO NOT use if you have an allergy to CHG or antibacterial soaps.  If your skin becomes reddened/irritated stop using the CHG and inform your nurse when you arrive at Short Stay.  Do not shave (including legs and underarms) for at least 48 hours prior to the first CHG shower.  You may shave your face.  Please follow these instructions carefully:   1.  Shower with CHG Soap the night before surgery and the morning of Surgery.  2.  If you choose to wash your hair, wash your hair first as usual with your normal shampoo.  3.  After you shampoo, rinse your hair and body thoroughly to remove the Shampoo.  4.  Use CHG as you would any other liquid soap.  You can apply chg directly  to the skin and wash gently with scrungie or a clean washcloth.  5.  Apply the CHG Soap to your body ONLY FROM THE NECK DOWN.  Do not use on open wounds or open sores.  Avoid contact with your eyes ears, mouth and genitals (private parts).  Wash genitals (private parts)       with your  normal soap.  6.  Wash thoroughly, paying special attention to the area where your surgery will be performed.  7.  Thoroughly rinse your body with warm water from the neck down.  8.  DO NOT shower/wash with your normal soap after using and rinsing off the CHG Soap.  9.  Pat yourself dry with a clean towel.            10.  Wear clean pajamas.            11.  Place clean sheets on your bed the night of your first shower and do not sleep with pets.  Day of Surgery  Do not apply any lotions/deodorants the morning of surgery.  Please wear clean clothes to the hospital/surgery center..  Please read over the following fact sheets that you were given: Pain Booklet, Coughing and Deep Breathing and Surgical Site Infection Prevention

## 2013-10-14 ENCOUNTER — Encounter (HOSPITAL_COMMUNITY): Payer: Self-pay

## 2013-10-14 ENCOUNTER — Encounter (HOSPITAL_COMMUNITY)
Admission: RE | Admit: 2013-10-14 | Discharge: 2013-10-14 | Disposition: A | Discharge status: Home / Self Care | Payer: BC Managed Care – PPO | Source: Ambulatory Visit | Attending: Anesthesiology | Admitting: Anesthesiology

## 2013-10-14 ENCOUNTER — Encounter (HOSPITAL_COMMUNITY)
Admission: RE | Admit: 2013-10-14 | Discharge: 2013-10-14 | Disposition: A | Discharge status: Home / Self Care | Payer: BC Managed Care – PPO | Source: Ambulatory Visit | Attending: Orthopedic Surgery | Admitting: Orthopedic Surgery

## 2013-10-14 DIAGNOSIS — E669 Obesity, unspecified: Secondary | ICD-10-CM | POA: Diagnosis not present

## 2013-10-14 DIAGNOSIS — G2581 Restless legs syndrome: Secondary | ICD-10-CM | POA: Diagnosis not present

## 2013-10-14 DIAGNOSIS — W109XXA Fall (on) (from) unspecified stairs and steps, initial encounter: Secondary | ICD-10-CM | POA: Diagnosis not present

## 2013-10-14 DIAGNOSIS — Z880 Allergy status to penicillin: Secondary | ICD-10-CM | POA: Diagnosis not present

## 2013-10-14 DIAGNOSIS — J45909 Unspecified asthma, uncomplicated: Secondary | ICD-10-CM | POA: Diagnosis not present

## 2013-10-14 DIAGNOSIS — M1990 Unspecified osteoarthritis, unspecified site: Secondary | ICD-10-CM | POA: Diagnosis not present

## 2013-10-14 DIAGNOSIS — Z885 Allergy status to narcotic agent status: Secondary | ICD-10-CM | POA: Diagnosis not present

## 2013-10-14 DIAGNOSIS — I10 Essential (primary) hypertension: Secondary | ICD-10-CM | POA: Diagnosis not present

## 2013-10-14 DIAGNOSIS — Z881 Allergy status to other antibiotic agents status: Secondary | ICD-10-CM | POA: Diagnosis not present

## 2013-10-14 DIAGNOSIS — K219 Gastro-esophageal reflux disease without esophagitis: Secondary | ICD-10-CM | POA: Diagnosis not present

## 2013-10-14 DIAGNOSIS — Y929 Unspecified place or not applicable: Secondary | ICD-10-CM | POA: Diagnosis not present

## 2013-10-14 DIAGNOSIS — F329 Major depressive disorder, single episode, unspecified: Secondary | ICD-10-CM | POA: Diagnosis not present

## 2013-10-14 DIAGNOSIS — E785 Hyperlipidemia, unspecified: Secondary | ICD-10-CM | POA: Diagnosis not present

## 2013-10-14 DIAGNOSIS — Z01818 Encounter for other preprocedural examination: Secondary | ICD-10-CM

## 2013-10-14 DIAGNOSIS — Z882 Allergy status to sulfonamides status: Secondary | ICD-10-CM | POA: Diagnosis not present

## 2013-10-14 DIAGNOSIS — S8261XA Displaced fracture of lateral malleolus of right fibula, initial encounter for closed fracture: Secondary | ICD-10-CM | POA: Diagnosis present

## 2013-10-14 DIAGNOSIS — G4730 Sleep apnea, unspecified: Secondary | ICD-10-CM | POA: Diagnosis not present

## 2013-10-14 HISTORY — DX: Unspecified asthma, uncomplicated: J45.909

## 2013-10-14 HISTORY — DX: Headache: R51

## 2013-10-14 HISTORY — DX: Nausea with vomiting, unspecified: R11.2

## 2013-10-14 HISTORY — DX: Headache, unspecified: R51.9

## 2013-10-14 HISTORY — DX: Other specified postprocedural states: Z98.890

## 2013-10-14 HISTORY — DX: Gastro-esophageal reflux disease without esophagitis: K21.9

## 2013-10-14 HISTORY — DX: Personal history of other diseases of the digestive system: Z87.19

## 2013-10-14 HISTORY — DX: Essential (primary) hypertension: I10

## 2013-10-14 HISTORY — DX: Other specified postprocedural states: R11.2

## 2013-10-14 LAB — BASIC METABOLIC PANEL
Anion gap: 11 (ref 5–15)
BUN: 18 mg/dL (ref 6–23)
CALCIUM: 9.2 mg/dL (ref 8.4–10.5)
CHLORIDE: 102 meq/L (ref 96–112)
CO2: 26 mEq/L (ref 19–32)
CREATININE: 0.88 mg/dL (ref 0.50–1.10)
GFR calc Af Amer: 83 mL/min — ABNORMAL LOW (ref >90)
GFR calc non Af Amer: 72 mL/min — ABNORMAL LOW (ref >90)
GLUCOSE: 134 mg/dL — AB (ref 70–99)
Potassium: 4.8 mEq/L (ref 3.7–5.3)
Sodium: 139 mEq/L (ref 137–147)

## 2013-10-14 LAB — CBC
HEMATOCRIT: 41 % (ref 36.0–46.0)
Hemoglobin: 13.3 g/dL (ref 12.0–15.0)
MCH: 28.2 pg (ref 26.0–34.0)
MCHC: 32.4 g/dL (ref 30.0–36.0)
MCV: 86.9 fL (ref 78.0–100.0)
Platelets: 244 10*3/uL (ref 150–400)
RBC: 4.72 MIL/uL (ref 3.87–5.11)
RDW: 13.5 % (ref 11.5–15.5)
WBC: 7.1 10*3/uL (ref 4.0–10.5)

## 2013-10-14 NOTE — Progress Notes (Signed)
req'd office notes RE: heart murmur( ? Echo 80's)

## 2013-10-16 MED ORDER — CHLORHEXIDINE GLUCONATE 4 % EX LIQD
60.0000 mL | Freq: Once | CUTANEOUS | Status: DC
  Filled 2013-10-16: qty 60

## 2013-10-16 MED ORDER — VANCOMYCIN HCL 10 G IV SOLR
1500.0000 mg | INTRAVENOUS | Status: AC
  Administered 2013-10-17: 1500 mg via INTRAVENOUS
  Filled 2013-10-16: qty 1500

## 2013-10-17 ENCOUNTER — Encounter (HOSPITAL_COMMUNITY): Payer: Self-pay | Admitting: *Deleted

## 2013-10-17 ENCOUNTER — Encounter (HOSPITAL_COMMUNITY): Payer: BC Managed Care – PPO | Admitting: Vascular Surgery

## 2013-10-17 ENCOUNTER — Encounter (HOSPITAL_COMMUNITY)
Admission: RE | Disposition: A | Discharge status: Home / Self Care | Payer: Self-pay | Source: Ambulatory Visit | Attending: Orthopedic Surgery

## 2013-10-17 ENCOUNTER — Ambulatory Visit (HOSPITAL_COMMUNITY)
Admission: RE | Admit: 2013-10-17 | Discharge: 2013-10-17 | Disposition: A | Discharge status: Home / Self Care | Payer: BC Managed Care – PPO | Source: Ambulatory Visit | Attending: Orthopedic Surgery | Admitting: Orthopedic Surgery

## 2013-10-17 ENCOUNTER — Ambulatory Visit (HOSPITAL_COMMUNITY): Payer: BC Managed Care – PPO | Admitting: Anesthesiology

## 2013-10-17 DIAGNOSIS — Z881 Allergy status to other antibiotic agents status: Secondary | ICD-10-CM | POA: Insufficient documentation

## 2013-10-17 DIAGNOSIS — S8261XA Displaced fracture of lateral malleolus of right fibula, initial encounter for closed fracture: Secondary | ICD-10-CM | POA: Insufficient documentation

## 2013-10-17 DIAGNOSIS — W109XXA Fall (on) (from) unspecified stairs and steps, initial encounter: Secondary | ICD-10-CM | POA: Insufficient documentation

## 2013-10-17 DIAGNOSIS — F329 Major depressive disorder, single episode, unspecified: Secondary | ICD-10-CM | POA: Insufficient documentation

## 2013-10-17 DIAGNOSIS — Z880 Allergy status to penicillin: Secondary | ICD-10-CM | POA: Insufficient documentation

## 2013-10-17 DIAGNOSIS — Y929 Unspecified place or not applicable: Secondary | ICD-10-CM | POA: Insufficient documentation

## 2013-10-17 DIAGNOSIS — I10 Essential (primary) hypertension: Secondary | ICD-10-CM | POA: Insufficient documentation

## 2013-10-17 DIAGNOSIS — E785 Hyperlipidemia, unspecified: Secondary | ICD-10-CM | POA: Insufficient documentation

## 2013-10-17 DIAGNOSIS — K219 Gastro-esophageal reflux disease without esophagitis: Secondary | ICD-10-CM | POA: Insufficient documentation

## 2013-10-17 DIAGNOSIS — G4730 Sleep apnea, unspecified: Secondary | ICD-10-CM | POA: Insufficient documentation

## 2013-10-17 DIAGNOSIS — G2581 Restless legs syndrome: Secondary | ICD-10-CM | POA: Insufficient documentation

## 2013-10-17 DIAGNOSIS — Z885 Allergy status to narcotic agent status: Secondary | ICD-10-CM | POA: Insufficient documentation

## 2013-10-17 DIAGNOSIS — Z882 Allergy status to sulfonamides status: Secondary | ICD-10-CM | POA: Insufficient documentation

## 2013-10-17 DIAGNOSIS — E669 Obesity, unspecified: Secondary | ICD-10-CM | POA: Insufficient documentation

## 2013-10-17 DIAGNOSIS — M1990 Unspecified osteoarthritis, unspecified site: Secondary | ICD-10-CM | POA: Insufficient documentation

## 2013-10-17 DIAGNOSIS — J45909 Unspecified asthma, uncomplicated: Secondary | ICD-10-CM | POA: Insufficient documentation

## 2013-10-17 HISTORY — PX: ORIF ANKLE FRACTURE: SHX5408

## 2013-10-17 SURGERY — OPEN REDUCTION INTERNAL FIXATION (ORIF) ANKLE FRACTURE
Anesthesia: Regional | Site: Ankle | Laterality: Right

## 2013-10-17 MED ORDER — MIDAZOLAM HCL 2 MG/2ML IJ SOLN
INTRAMUSCULAR | Status: AC
  Administered 2013-10-17: 2 mg
  Filled 2013-10-17: qty 2

## 2013-10-17 MED ORDER — MIDAZOLAM HCL 5 MG/ML IJ SOLN: 2.0000 mg | Freq: Once | INTRAMUSCULAR | Status: DC

## 2013-10-17 MED ORDER — LACTATED RINGERS IV SOLN
INTRAVENOUS | Status: DC
  Administered 2013-10-17: 10:00:00 via INTRAVENOUS

## 2013-10-17 MED ORDER — MIDAZOLAM HCL 2 MG/2ML IJ SOLN
INTRAMUSCULAR | Status: AC
  Filled 2013-10-17: qty 2

## 2013-10-17 MED ORDER — SCOPOLAMINE 1 MG/3DAYS TD PT72
MEDICATED_PATCH | TRANSDERMAL | Status: DC | PRN
  Administered 2013-10-17: 1 via TRANSDERMAL

## 2013-10-17 MED ORDER — DEXAMETHASONE SODIUM PHOSPHATE 4 MG/ML IJ SOLN
INTRAMUSCULAR | Status: AC
Start: 2013-10-17 — End: 2013-10-17
  Filled 2013-10-17: qty 1

## 2013-10-17 MED ORDER — SCOPOLAMINE 1 MG/3DAYS TD PT72
MEDICATED_PATCH | TRANSDERMAL | Status: AC
  Filled 2013-10-17: qty 1

## 2013-10-17 MED ORDER — FENTANYL CITRATE 0.05 MG/ML IJ SOLN
100.0000 ug | Freq: Once | INTRAMUSCULAR | Status: AC
  Administered 2013-10-17: 100 ug via INTRAVENOUS

## 2013-10-17 MED ORDER — 0.9 % SODIUM CHLORIDE (POUR BTL) OPTIME
TOPICAL | Status: DC | PRN
  Administered 2013-10-17: 1000 mL

## 2013-10-17 MED ORDER — ARTIFICIAL TEARS OP OINT
TOPICAL_OINTMENT | OPHTHALMIC | Status: AC
  Filled 2013-10-17: qty 3.5

## 2013-10-17 MED ORDER — HYDROCODONE-ACETAMINOPHEN 5-325 MG PO TABS: 1.0000 | ORAL_TABLET | Freq: Four times a day (QID) | ORAL | Status: DC | PRN

## 2013-10-17 MED ORDER — ONDANSETRON HCL 4 MG/2ML IJ SOLN
INTRAMUSCULAR | Status: DC | PRN
  Administered 2013-10-17: 4 mg via INTRAVENOUS

## 2013-10-17 MED ORDER — IBUPROFEN 600 MG PO TABS: 600.0000 mg | ORAL_TABLET | Freq: Three times a day (TID) | ORAL | Status: DC

## 2013-10-17 MED ORDER — PROPOFOL INFUSION 10 MG/ML OPTIME
INTRAVENOUS | Status: DC | PRN
  Administered 2013-10-17: 100 ug/kg/min via INTRAVENOUS

## 2013-10-17 MED ORDER — FENTANYL CITRATE 0.05 MG/ML IJ SOLN
INTRAMUSCULAR | Status: AC
  Filled 2013-10-17: qty 2

## 2013-10-17 MED ORDER — PROPOFOL 10 MG/ML IV BOLUS
INTRAVENOUS | Status: DC | PRN
  Administered 2013-10-17: 150 mg via INTRAVENOUS
  Administered 2013-10-17: 100 mg via INTRAVENOUS

## 2013-10-17 MED ORDER — MIDAZOLAM HCL 5 MG/5ML IJ SOLN
INTRAMUSCULAR | Status: DC | PRN
  Administered 2013-10-17: 2 mg via INTRAVENOUS

## 2013-10-17 MED ORDER — FENTANYL CITRATE 0.05 MG/ML IJ SOLN
INTRAMUSCULAR | Status: AC
  Filled 2013-10-17: qty 5

## 2013-10-17 MED ORDER — ROPIVACAINE HCL 5 MG/ML IJ SOLN
INTRAMUSCULAR | Status: DC | PRN
  Administered 2013-10-17: 15 mL via PERINEURAL
  Administered 2013-10-17: 30 mL via PERINEURAL

## 2013-10-17 MED ORDER — DEXAMETHASONE SODIUM PHOSPHATE 4 MG/ML IJ SOLN
INTRAMUSCULAR | Status: DC | PRN
  Administered 2013-10-17: 4 mg via INTRAVENOUS

## 2013-10-17 MED ORDER — ONDANSETRON HCL 4 MG/2ML IJ SOLN
INTRAMUSCULAR | Status: AC
  Filled 2013-10-17: qty 2

## 2013-10-17 MED ORDER — FENTANYL CITRATE 0.05 MG/ML IJ SOLN
INTRAMUSCULAR | Status: DC | PRN
  Administered 2013-10-17: 100 ug via INTRAVENOUS
  Administered 2013-10-17: 50 ug via INTRAVENOUS
  Administered 2013-10-17: 100 ug via INTRAVENOUS

## 2013-10-17 SURGICAL SUPPLY — 64 items
BANDAGE ELASTIC 4 VELCRO ST LF (GAUZE/BANDAGES/DRESSINGS) ×2 IMPLANT
BANDAGE ELASTIC 6 VELCRO ST LF (GAUZE/BANDAGES/DRESSINGS) ×2 IMPLANT
BANDAGE ESMARK 6X9 LF (GAUZE/BANDAGES/DRESSINGS) ×1 IMPLANT
BIT DRILL 2.5X110 QC LCP DISP (BIT) ×2 IMPLANT
BIT DRILL QC 3.5X110 (BIT) ×2 IMPLANT
BNDG CMPR 9X6 STRL LF SNTH (GAUZE/BANDAGES/DRESSINGS) ×1
BNDG COHESIVE 6X5 TAN STRL LF (GAUZE/BANDAGES/DRESSINGS) ×6 IMPLANT
BNDG ESMARK 6X9 LF (GAUZE/BANDAGES/DRESSINGS) ×3
CLOSURE WOUND 1/2 X4 (GAUZE/BANDAGES/DRESSINGS)
COVER SURGICAL LIGHT HANDLE (MISCELLANEOUS) ×3 IMPLANT
CUFF TOURNIQUET SINGLE 34IN LL (TOURNIQUET CUFF) IMPLANT
DRAPE INCISE IOBAN 66X45 STRL (DRAPES) ×3 IMPLANT
DRAPE OEC MINIVIEW 54X84 (DRAPES) ×3 IMPLANT
DRAPE PROXIMA HALF (DRAPES) ×3 IMPLANT
DRAPE U-SHAPE 47X51 STRL (DRAPES) ×3 IMPLANT
DRSG EMULSION OIL 3X3 NADH (GAUZE/BANDAGES/DRESSINGS) ×3 IMPLANT
DRSG PAD ABDOMINAL 8X10 ST (GAUZE/BANDAGES/DRESSINGS) ×2 IMPLANT
DURAPREP 26ML APPLICATOR (WOUND CARE) ×3 IMPLANT
ELECT REM PT RETURN 9FT ADLT (ELECTROSURGICAL) ×3
ELECTRODE REM PT RTRN 9FT ADLT (ELECTROSURGICAL) ×1 IMPLANT
GAUZE SPONGE 4X4 12PLY STRL (GAUZE/BANDAGES/DRESSINGS) ×3 IMPLANT
GLOVE BIOGEL PI IND STRL 8.5 (GLOVE) ×1 IMPLANT
GLOVE BIOGEL PI INDICATOR 8.5 (GLOVE) ×2
GLOVE BIOGEL PI ORTHO PRO SZ8 (GLOVE) ×2
GLOVE PI ORTHO PRO STRL SZ8 (GLOVE) ×1 IMPLANT
GLOVE SURG ORTHO 8.0 STRL STRW (GLOVE) ×6 IMPLANT
GOWN STRL REUS W/ TWL XL LVL3 (GOWN DISPOSABLE) ×1 IMPLANT
GOWN STRL REUS W/TWL XL LVL3 (GOWN DISPOSABLE) ×3
KIT 1/3 TUB PL 7H 85M (Orthopedic Implant) IMPLANT
KIT BASIN OR (CUSTOM PROCEDURE TRAY) ×3 IMPLANT
KIT ROOM TURNOVER OR (KITS) ×3 IMPLANT
MANIFOLD NEPTUNE II (INSTRUMENTS) ×3 IMPLANT
NDL HYPO 25GX1X1/2 BEV (NEEDLE) IMPLANT
NEEDLE HYPO 25GX1X1/2 BEV (NEEDLE) IMPLANT
NS IRRIG 1000ML POUR BTL (IV SOLUTION) ×3 IMPLANT
PACK ORTHO EXTREMITY (CUSTOM PROCEDURE TRAY) ×3 IMPLANT
PAD ARMBOARD 7.5X6 YLW CONV (MISCELLANEOUS) ×6 IMPLANT
PAD CAST 4YDX4 CTTN HI CHSV (CAST SUPPLIES) ×1 IMPLANT
PADDING CAST COTTON 4X4 STRL (CAST SUPPLIES) ×3
PADDING CAST COTTON 6X4 STRL (CAST SUPPLIES) ×3 IMPLANT
PROS 1/3 TUB PL 7H 85M (Orthopedic Implant) ×3 IMPLANT
SCREW CANC FT ST SFS 4X14 (Screw) ×4 IMPLANT
SCREW CORTEX 3.5 14MM (Screw) ×4 IMPLANT
SCREW CORTEX 3.5 16MM (Screw) ×2 IMPLANT
SCREW CORTEX 3.5 20MM (Screw) ×2 IMPLANT
SCREW LOCK CORT ST 3.5X14 (Screw) IMPLANT
SCREW LOCK CORT ST 3.5X16 (Screw) IMPLANT
SCREW LOCK CORT ST 3.5X20 (Screw) IMPLANT
SPLINT PLASTER CAST XFAST 5X30 (CAST SUPPLIES) IMPLANT
SPLINT PLASTER XFAST SET 5X30 (CAST SUPPLIES) ×2
SPONGE GAUZE 4X4 12PLY STER LF (GAUZE/BANDAGES/DRESSINGS) ×2 IMPLANT
SPONGE LAP 18X18 X RAY DECT (DISPOSABLE) ×3 IMPLANT
STAPLER VISISTAT 35W (STAPLE) IMPLANT
STRIP CLOSURE SKIN 1/2X4 (GAUZE/BANDAGES/DRESSINGS) IMPLANT
SUCTION FRAZIER TIP 10 FR DISP (SUCTIONS) ×3 IMPLANT
SUT MNCRL AB 3-0 PS2 18 (SUTURE) IMPLANT
SUT VIC AB 2-0 CT1 27 (SUTURE) ×6
SUT VIC AB 2-0 CT1 TAPERPNT 27 (SUTURE) ×2 IMPLANT
SYR CONTROL 10ML LL (SYRINGE) IMPLANT
TOWEL OR 17X24 6PK STRL BLUE (TOWEL DISPOSABLE) ×3 IMPLANT
TOWEL OR 17X26 10 PK STRL BLUE (TOWEL DISPOSABLE) ×3 IMPLANT
TUBE CONNECTING 12'X1/4 (SUCTIONS) ×1
TUBE CONNECTING 12X1/4 (SUCTIONS) ×2 IMPLANT
WATER STERILE IRR 1000ML POUR (IV SOLUTION) ×3 IMPLANT

## 2013-10-17 NOTE — Anesthesia Preprocedure Evaluation (Addendum)
Anesthesia Evaluation  Patient identified by MRN, date of birth, ID band Patient awake    Reviewed: Allergy & Precautions, H&P , NPO status , Patient's Chart, lab work & pertinent test results  History of Anesthesia Complications (+) PONV and history of anesthetic complications  Airway Mallampati: II TM Distance: >3 FB Neck ROM: Full    Dental  (+) Teeth Intact   Pulmonary asthma , sleep apnea ,  breath sounds clear to auscultation        Cardiovascular hypertension, Pt. on medications - angina+ Valvular Problems/Murmurs MVP Rhythm:Regular     Neuro/Psych  Headaches, PSYCHIATRIC DISORDERS Depression    GI/Hepatic Neg liver ROS, hiatal hernia, GERD-  Medicated and Controlled,  Endo/Other  neg diabetesMorbid obesity  Renal/GU negative Renal ROS     Musculoskeletal  (+) Arthritis -, Osteoarthritis,  Right ankle fracture   Abdominal   Peds  Hematology negative hematology ROS (+)   Anesthesia Other Findings   Reproductive/Obstetrics                          Anesthesia Physical Anesthesia Plan  ASA: II  Anesthesia Plan: Regional and General   Post-op Pain Management:    Induction: Intravenous  Airway Management Planned: Oral ETT and LMA  Additional Equipment: None  Intra-op Plan:   Post-operative Plan: Extubation in OR  Informed Consent: I have reviewed the patients History and Physical, chart, labs and discussed the procedure including the risks, benefits and alternatives for the proposed anesthesia with the patient or authorized representative who has indicated his/her understanding and acceptance.   Dental advisory given  Plan Discussed with: CRNA and Surgeon  Anesthesia Plan Comments:        Anesthesia Quick Evaluation

## 2013-10-17 NOTE — Anesthesia Postprocedure Evaluation (Signed)
  Anesthesia Post-op Note  Patient: Taylor Silva  Procedure(s) Performed: Procedure(s): OPEN REDUCTION INTERNAL FIXATION (ORIF) RIGHT ANKLE DISTAL FIBULA/LATERAL MALLEOLUS (Right)  Patient Location: PACU  Anesthesia Type:GA combined with regional for post-op pain  Level of Consciousness: awake, alert  and oriented  Airway and Oxygen Therapy: Patient Spontanous Breathing  Post-op Pain: none  Post-op Assessment: Post-op Vital signs reviewed  Post-op Vital Signs: Reviewed  Last Vitals:  Filed Vitals:   10/17/13 1515  BP: 93/54  Pulse: 64  Temp: 36.3 C  Resp: 13    Complications: No apparent anesthesia complications

## 2013-10-17 NOTE — Anesthesia Procedure Notes (Signed)
Anesthesia Regional Block:  Adductor canal block  Pre-Anesthetic Checklist: ,, timeout performed, Correct Patient, Correct Site, Correct Laterality, Correct Procedure, Correct Position, site marked, Risks and benefits discussed,  Surgical consent,  Pre-op evaluation,  At surgeon's request and post-op pain management  Laterality: Lower and Right  Prep: chloraprep       Needles:  Injection technique: Single-shot  Needle Type: Echogenic Needle          Additional Needles:  Procedures: ultrasound guided (picture in chart) Adductor canal block Narrative:  Start time: 10/17/2013 12:29 PM End time: 10/17/2013 12:33 PM Injection made incrementally with aspirations every 5 mL.  Performed by: Personally  Anesthesiologist: Dannae Kato  Additional Notes: H+P and labs reviewed, risks and benefits discussed with patient, procedure tolerated well without complications   Anesthesia Regional Block:  Popliteal block  Pre-Anesthetic Checklist: ,, timeout performed, Correct Patient, Correct Site, Correct Laterality, Correct Procedure, Correct Position, site marked, Risks and benefits discussed,  Surgical consent,  Pre-op evaluation,  At surgeon's request and post-op pain management  Laterality: Lower and Right  Prep: chloraprep       Needles:  Injection technique: Single-shot  Needle Type: Echogenic Stimulator Needle          Additional Needles:  Procedures: ultrasound guided (picture in chart) and nerve stimulator Popliteal block  Nerve Stimulator or Paresthesia:  Response: plantarflexion, 0.5 mA,   Additional Responses:   Narrative:  Start time: 10/17/2013 12:18 PM End time: 10/17/2013 12:26 PM Injection made incrementally with aspirations every 5 mL.  Performed by: Personally  Anesthesiologist: Taneeka Curtner  Additional Notes: H+P and labs reviewed, risks and benefits discussed with patient, procedure tolerated well without complications

## 2013-10-17 NOTE — Transfer of Care (Signed)
Immediate Anesthesia Transfer of Care Note  Patient: Taylor Silva  Procedure(s) Performed: Procedure(s): OPEN REDUCTION INTERNAL FIXATION (ORIF) RIGHT ANKLE DISTAL FIBULA/LATERAL MALLEOLUS (Right)  Patient Location: PACU  Anesthesia Type:General  Level of Consciousness: awake, alert  and oriented  Airway & Oxygen Therapy: Patient Spontanous Breathing and Patient connected to nasal cannula oxygen  Post-op Assessment: Report given to PACU RN and Post -op Vital signs reviewed and stable  Post vital signs: Reviewed and stable  Complications: No apparent anesthesia complications

## 2013-10-17 NOTE — Discharge Instructions (Signed)
Diet: As you were doing prior to hospitalization   Activity:  Increase activity slowly as tolerated                  No lifting or driving for 6 weeks  Shower:  May shower without a dressing once there is no drainage from your wound.                 Do NOT wash over the wound.                 Dressing:  You may change your dressing on Wednesday                    Then change the dressing daily with sterile 4"x4"s gauze dressing                     And TED hose for knees.  Weight Bearing:  Weight bearing as tolerated as taught in physical therapy.  Use a                                walker or Crutches as instructed.  To prevent constipation: you may use a stool softener such as -               Colace ( over the counter) 100 mg by mouth twice a day                Drink plenty of fluids ( prune juice may be helpful) and high fiber foods                Miralax ( over the counter) for constipation as needed.    Precautions:  If you experience chest pain or shortness of breath - call 911 immediately               For transfer to the hospital emergency department!!               If you develop a fever greater that 101 F, purulent drainage from wound,                             increased redness or drainage from wound, or calf pain -- Call the office.  Follow- Up Appointment:  Please call for an appointment to be seen on 10/20/13                                              [facility] office:  [phone]            [place], [place] [address]                What to eat:  For your first meals, you should eat lightly; only small meals initially.  If you do not have nausea, you may eat larger meals.  Avoid spicy, greasy and heavy food.    General Anesthesia, Adult, Care After  Refer to this sheet in the next few weeks. These instructions provide you with information on caring for yourself after your procedure. Your health care provider may also give you more specific  instructions. Your treatment has been planned according to current medical practices, but problems sometimes occur. Call  your health care provider if you have any problems or questions after your procedure.  WHAT TO EXPECT AFTER THE PROCEDURE  After the procedure, it is typical to experience:  Sleepiness.  Nausea and vomiting. HOME CARE INSTRUCTIONS  For the first 24 hours after general anesthesia:  Have a responsible person with you.  Do not drive a car. If you are alone, do not take public transportation.  Do not drink alcohol.  Do not take medicine that has not been prescribed by your health care provider.  Do not sign important papers or make important decisions.  You may resume a normal diet and activities as directed by your health care provider.  Change bandages (dressings) as directed.  If you have questions or problems that seem related to general anesthesia, call the hospital and ask for the anesthetist or anesthesiologist on call. SEEK MEDICAL CARE IF:  You have nausea and vomiting that continue the day after anesthesia.  You develop a rash. SEEK IMMEDIATE MEDICAL CARE IF:  You have difficulty breathing.  You have chest pain.  You have any allergic problems. Document Released: 03/31/2000 Document Revised: 08/25/2012 Document Reviewed: 07/08/2012  [facility] Patient Information 2014 [place], [place].

## 2013-10-17 NOTE — Progress Notes (Signed)
Spoke with Linton Rump pa for Dr. Ronnie Derby, patient is not to take any additional tylenol and is to disregard the prescription for advil that was written

## 2013-10-17 NOTE — H&P (Signed)
NAME: Taylor Silva MRN:   [id number] DOB:   December 03, 1955     HISTORY AND PHYSICAL  CHIEF COMPLAINT:  Right ankle pain  HISTORY:   Taylor Silva a 58 y.o. female  with right  Ankle Pain Patient complains of right ankle pain. Onset of the symptoms was a week ago. Inciting event: injured while walking done the stairs. Current symptoms include: inability to bear weight. Aggravating factors: direct pressure. Symptoms have been well-controlled. Patient has had prior ankle problems. Previous visits for this problem: none.  Evaluation to date: plain films: abnormal fibular fracture.  PAST MEDICAL HISTORY:   Past Medical History  Diagnosis Date  . Fructose intolerance   . Arthritis   . Seasonal allergies   . Restless legs   . Depression   . Hyperlipidemia   . Obesity   . Heart murmur     echo done 80's neg  . Hypertension   . Sleep apnea     Intolerant to CPAP last sleep study  . Asthma   . Pneumonia     hx  . GERD (gastroesophageal reflux disease)   . H/O hiatal hernia   . Headache     migraines  . PONV (postoperative nausea and vomiting)     patch used last time    PAST SURGICAL HISTORY:   Past Surgical History  Procedure Laterality Date  . Knee surgery  04/2009    right knee, meniscus tear  . Wrist surgery  1980s    cyst removed from right wrist  . Abdominal hysterectomy  1995    No oophorectomy  . Other surgical history  1995?    removed scar tissue from colon  . Thigh tuck Bilateral     inner thigh tuck    MEDICATIONS:   No prescriptions prior to admission    ALLERGIES:   Allergies  Allergen Reactions  . Codeine Anaphylaxis  . Erythromycin Hives  . Tetracyclines & Related Hives  . Other Other (See Comments)    Tomatoes upset stomach  . Penicillins Hives  . Sulfa Antibiotics Nausea And Vomiting    REVIEW OF SYSTEMS:   Negative except rom  FAMILY HISTORY:   Family History  Problem Relation Age of Onset  . Diabetes Mother   . Hyperlipidemia  Mother   . Hypertension Mother   . Heart attack Mother     32 y/o  . Diabetes Sister   . Hyperlipidemia Sister   . Hypertension Sister   . Diabetes Brother   . Hyperlipidemia Brother   . Hypertension Brother   . Heart attack Brother   . Diabetes Maternal Grandmother   . Hyperlipidemia Maternal Grandmother   . Hypertension Maternal Grandmother   . Heart disease Father   . Leukemia Father   . Colon cancer Neg Hx   . Breast cancer Other     GM    SOCIAL HISTORY:   reports that she has never smoked. She has never used smokeless tobacco. She reports that she does not drink alcohol or use illicit drugs.  PHYSICAL EXAM:  General appearance: alert, cooperative and no distress Resp: clear to auscultation bilaterally Cardio: regular rate and rhythm, S1, S2 normal, no murmur, click, rub or gallop GI: soft, non-tender; bowel sounds normal; no masses,  no organomegaly Extremities: extremities normal, atraumatic, no cyanosis or edema and Homans sign is negative, no sign of DVT Pulses: 2+ and symmetric Skin: Skin color, texture, turgor normal. No rashes or lesions Neurologic: Alert and oriented  X 3, normal strength and tone. Normal symmetric reflexes. Normal coordination and gait    LABORATORY STUDIES:  Recent Labs  10/14/13 0841  WBC 7.1  HGB 13.3  HCT 41.0  PLT 244     Recent Labs  10/14/13 0841  NA 139  K 4.8  CL 102  CO2 26  GLUCOSE 134*  BUN 18  CREATININE 0.88  CALCIUM 9.2    STUDIES/RESULTS:  Dg Chest 2 View  10/14/2013   CLINICAL DATA:  Preoperative exam prior to ankle surgery for fracture; history of asthma and hypertension. And heart murmur  EXAM: CHEST  2 VIEW  COMPARISON:  Chest x-ray of June 19, 2008  FINDINGS: The lungs are adequately inflated. There is no focal infiltrate. Lung markings are coarse in the retrocardiac region but definite air bronchograms are not demonstrated. This likely is related to infrahilar lung markings on the right. The heart and  pulmonary vascularity are normal. The mediastinum is normal in width. There is no pleural effusion. The bony thorax is unremarkable.  IMPRESSION: There is no evidence of pneumonia. There may be subsegmental atelectasis or scarring in the right lower lobe posteriorly.   Electronically Signed   By: David  Martinique   On: 10/14/2013 09:51   Dg Tibia/fibula Right  10/08/2013   CLINICAL DATA:  Trauma and pain.  EXAM: RIGHT TIBIA AND FIBULA - 2 VIEW  COMPARISON:  Knee films of 09/22/2006  FINDINGS: Mild osteoarthritis about the knee. Minimally displaced distal fibular fracture with lateral displacement of distal fracture fragment. Posttraumatic. Favor nutrient foramen within the tibial shaft. Osseous irregularity about the medial malleolus is likely due to degenerative change or remote trauma. Small calcaneal spur.  IMPRESSION: Acute distal fibular fracture.   Electronically Signed   By: Abigail Miyamoto M.D.   On: 10/08/2013 10:42   Dg Ankle Complete Right  10/08/2013   CLINICAL DATA:  Patient tripped on the stairs earlier this morning landing on the right leg/ ankle. Pain radiating from the right knee to the right ankle with lateral ankle pain and swelling. Initial encounter.  EXAM: RIGHT ANKLE - COMPLETE 3+ VIEW  COMPARISON:  None.  FINDINGS: There is an oblique, intra-articular fracture of the distal fibula which demonstrates mild lateral displacement. There is a 4 mm ossicle at the tip of the medial malleolus. A small plantar calcaneal enthesophyte is noted. There is no dislocation. There is prominent lateral ankle soft tissue swelling.  IMPRESSION: Mildly displaced distal fibular fracture. 4 mm ossicle at the tip of the medial malleolus suggestive of minimally displaced fracture of indeterminate acuity.   Electronically Signed   By: Logan Bores   On: 10/08/2013 10:39   Dg Knee Complete 4 Views Right  10/08/2013   CLINICAL DATA:  Status post fall last night with pain radiating from the right knee inferiorly;  audible pop in the right knee ; history of previous knee surgery ; initial visit  EXAM: RIGHT KNEE - COMPLETE 4+ VIEW  COMPARISON:  Right knee series of September 22, 2006  FINDINGS: The bones of the knee are reasonably well mineralized for age. There is no lytic or blastic lesion. There is no acute fracture nor dislocation. There is beaking of the tibial spines. There are marginal osteophytes from the periphery of the femoral condyles and tibial plateaus. There spurs from the inferior margin of the patella. There is no joint effusion.  IMPRESSION: There are degenerative changes of the right knee. There is no acute fracture nor dislocation.  Electronically Signed   By: David  Martinique   On: 10/08/2013 10:41    ASSESSMENT:  Right ankle fracture        Active Problems:   * No active hospital problems. *    PLAN: right ankle ORIF   Deaundra Kutzer 10/17/2013. 6:57 AM

## 2013-10-18 NOTE — Op Note (Signed)
NAMEMCKINNA, DEMARS NO.:  [id number]  MEDICAL RECORD NO.:  [id number]  LOCATION:  [facility]                         FACILITY:  [facility]  PHYSICIAN:  Estill Bamberg. Ronnie Derby, M.D. DATE OF BIRTH:  08/08/1955  DATE OF PROCEDURE:  10/17/2013 DATE OF DISCHARGE:  10/17/2013                              OPERATIVE REPORT   SURGEON:  Estill Bamberg. Ronnie Derby, M.D.  ASSISTANT:  None.  ANESTHESIA:  General.  PREOPERATIVE DIAGNOSIS:  Right lateral malleolus fracture.  POSTOPERATIVE DIAGNOSIS:  Right lateral malleolus fracture.  PROCEDURE:  Right open reduction and internal fixation of lateral malleolus fracture.  INDICATION FOR PROCEDURE:  The patient is a 58 year old who presented to the emergency department after a fall and twisting injury.  Radiographic evidence of a Weber B ankle fracture, and she was referred to me and the mortise  was not reduced.  Informed consent was obtained.  DESCRIPTION OF PROCEDURE:  The patient was laid supine, administered general anesthesia, then placed in the supine position.  Right ankle was prepped and draped in usual fashion.  A direct lateral incision was made with a #10 blade centered over the fracture approximately 6 cm in length.  New 15 blade was used to protect the superficial peroneal nerve going down to bone and then dissecting subperiosteally at the fracture. I cleaned out the fracture site with a dental pick and reduced with bone reduction forceps.  I then placed a 20 mm bicortical screw, lag screw fixing the fracture.  I removed the bone reduction clamp and the fracture remained anatomic.  I placed a 7 mm semi tubular hole plate over the fracture as a neutralization plate and placed 2 distal cancellous screws and 3 proximal bicortical screws and checked under AP and lateral C-arm images to ensure anatomic reduction and appropriate hardware length.  I then lavaged, closed with buried 0 and 2-0 Vicryl sutures, and small skin staples.   Dressed with Xeroform dressing sponges, a posterior splint and lateral superior spine, and then let the tourniquet down at 28 minutes.  COMPLICATIONS:  None.  DRAINS:  None.          ______________________________ Estill Bamberg. Ronnie Derby, M.D.     SDL/MEDQ  D:  10/18/2013  T:  10/18/2013  Job:  [id number]

## 2013-10-18 NOTE — Op Note (Signed)
Dictation Number:  [id number]

## 2013-10-19 ENCOUNTER — Encounter (HOSPITAL_COMMUNITY): Payer: Self-pay | Admitting: Orthopedic Surgery

## 2013-11-07 ENCOUNTER — Telehealth: Payer: Self-pay

## 2013-11-07 MED ORDER — TRAMADOL HCL 50 MG PO TABS: 50.0000 mg | ORAL_TABLET | Freq: Three times a day (TID) | ORAL | Status: DC | PRN

## 2013-11-07 NOTE — Telephone Encounter (Signed)
Okay to prescribe, prescription printed, needs a routine visit, please arrange

## 2013-11-07 NOTE — Telephone Encounter (Signed)
Faxed to [facility].  

## 2013-11-07 NOTE — Telephone Encounter (Signed)
Pt is requesting refill on Tramadol  Last OV: 06/24/2013 Last Fill: 08/24/2013 # [id number] UDS: 06/24/2013 Low risk   Please advise.

## 2014-01-10 ENCOUNTER — Ambulatory Visit (INDEPENDENT_AMBULATORY_CARE_PROVIDER_SITE_OTHER): Payer: BC Managed Care – PPO | Admitting: Family Medicine

## 2014-01-10 ENCOUNTER — Encounter: Payer: Self-pay | Admitting: Family Medicine

## 2014-01-10 VITALS — BP 129/83 | HR 97 | Temp 98.5°F | Wt 286.0 lb

## 2014-01-10 DIAGNOSIS — J020 Streptococcal pharyngitis: Secondary | ICD-10-CM

## 2014-01-10 DIAGNOSIS — J029 Acute pharyngitis, unspecified: Secondary | ICD-10-CM

## 2014-01-10 DIAGNOSIS — R05 Cough: Secondary | ICD-10-CM

## 2014-01-10 DIAGNOSIS — J4521 Mild intermittent asthma with (acute) exacerbation: Secondary | ICD-10-CM

## 2014-01-10 DIAGNOSIS — R059 Cough, unspecified: Secondary | ICD-10-CM

## 2014-01-10 LAB — POCT RAPID STREP A (OFFICE): Rapid Strep A Screen: POSITIVE — AB

## 2014-01-10 MED ORDER — PREDNISONE 10 MG PO TABS: ORAL_TABLET | ORAL | Status: DC

## 2014-01-10 MED ORDER — METHYLPREDNISOLONE ACETATE 80 MG/ML IJ SUSP
80.0000 mg | Freq: Once | INTRAMUSCULAR | Status: AC
  Administered 2014-01-10: 80 mg via INTRAMUSCULAR

## 2014-01-10 MED ORDER — AZITHROMYCIN 250 MG PO TABS: ORAL_TABLET | ORAL | Status: DC

## 2014-01-10 MED ORDER — BENZONATATE 200 MG PO CAPS: 200.0000 mg | ORAL_CAPSULE | Freq: Two times a day (BID) | ORAL | Status: DC | PRN

## 2014-01-10 NOTE — Patient Instructions (Signed)

## 2014-01-10 NOTE — Progress Notes (Signed)
Pre visit review using our clinic review tool, if applicable. No additional management support is needed unless otherwise documented below in the visit note. 

## 2014-01-11 ENCOUNTER — Encounter: Payer: Self-pay | Admitting: Family Medicine

## 2014-01-11 NOTE — Progress Notes (Signed)
  Subjective:     Taylor Silva is a 59 y.o. female who presents for evaluation of sore throat. Associated symptoms include chills, enlarged tonsils, nasal blockage, pain while swallowing, post nasal drip, sinus and nasal congestion, sore throat, swollen glands and white spots in throat. Onset of symptoms was 2 weeks ago, and have been gradually worsening since that time. She is drinking plenty of fluids. She has had a recent close exposure to someone with proven streptococcal pharyngitis.  The following portions of the patient's history were reviewed and updated as appropriate: allergies, current medications, past family history, past medical history, past social history, past surgical history and problem list.  Review of Systems Pertinent items are noted in HPI.    Objective:    BP 129/83 mmHg  Pulse 97  Temp(Src) 98.5 F (36.9 C) (Oral)  Wt 286 lb (129.729 kg)  SpO2 98% General appearance: alert, cooperative, appears stated age and no distress Ears: normal TM's and external ear canals both ears Nose: Nares normal. Septum midline. Mucosa normal. No drainage or sinus tenderness. Throat: lips, mucosa, and tongue normal; teeth and gums normal Neck: no adenopathy, no carotid bruit, no JVD, supple, symmetrical, trachea midline and thyroid not enlarged, symmetric, no tenderness/mass/nodules Lungs: clear to auscultation bilaterally Heart: S1, S2 normal Extremities: extremities normal, atraumatic, no cyanosis or edema  Laboratory Strep test done. Results:positive.    Assessment:    Acute pharyngitis, likely  Strep throat.    Plan:    Patient placed on antibiotics. Use of OTC analgesics recommended as well as salt water gargles. Patient advised that he will be infectious for 24 hours after starting antibiotics. Follow up as needed.

## 2014-01-19 ENCOUNTER — Other Ambulatory Visit: Payer: Self-pay | Admitting: Orthopedic Surgery

## 2014-01-19 DIAGNOSIS — M25561 Pain in right knee: Secondary | ICD-10-CM

## 2014-02-03 ENCOUNTER — Ambulatory Visit
Admission: RE | Admit: 2014-02-03 | Discharge: 2014-02-03 | Disposition: A | Discharge status: Home / Self Care | Payer: BLUE CROSS/BLUE SHIELD | Source: Ambulatory Visit | Attending: Orthopedic Surgery | Admitting: Orthopedic Surgery

## 2014-02-03 DIAGNOSIS — M25561 Pain in right knee: Secondary | ICD-10-CM

## 2014-04-04 ENCOUNTER — Other Ambulatory Visit: Payer: Self-pay

## 2014-04-05 ENCOUNTER — Ambulatory Visit (INDEPENDENT_AMBULATORY_CARE_PROVIDER_SITE_OTHER): Payer: BLUE CROSS/BLUE SHIELD | Admitting: Internal Medicine

## 2014-04-05 ENCOUNTER — Encounter: Payer: Self-pay | Admitting: Internal Medicine

## 2014-04-05 VITALS — BP 124/68 | HR 86 | Temp 98.0°F | Ht 67.0 in | Wt 301.4 lb

## 2014-04-05 DIAGNOSIS — R7303 Prediabetes: Secondary | ICD-10-CM

## 2014-04-05 DIAGNOSIS — R7309 Other abnormal glucose: Secondary | ICD-10-CM | POA: Diagnosis not present

## 2014-04-05 DIAGNOSIS — E785 Hyperlipidemia, unspecified: Secondary | ICD-10-CM

## 2014-04-05 DIAGNOSIS — E669 Obesity, unspecified: Secondary | ICD-10-CM

## 2014-04-05 DIAGNOSIS — I10 Essential (primary) hypertension: Secondary | ICD-10-CM

## 2014-04-05 NOTE — Progress Notes (Signed)
Pre visit review using our clinic review tool, if applicable. No additional management support is needed unless otherwise documented below in the visit note. 

## 2014-04-05 NOTE — Progress Notes (Signed)
Subjective:    Patient ID: Taylor Silva, female    DOB: 11/03/1955, 59 y.o.   MRN: [id number]  DOS:  04/05/2014 Type of visit - description : Surgical clearance Interval history: The patient is interested on proceed with bariatric surgery, she already meet with the team, will met the doctor soon and needs a letter of support. In general she is feeling well, had multiple surgeries in the past without complications except postoperative nausea. Labs and EKG over the last few months review.   Review of Systems Denies chest pain, difficulty breathing, no DOE with normal activities No nausea, vomiting, diarrhea No cough or wheezing   Past Medical History  Diagnosis Date  . Fructose intolerance   . Arthritis   . Seasonal allergies   . Restless legs   . Depression   . Hyperlipidemia   . Obesity   . Heart murmur     echo done 80's neg  . Hypertension   . Sleep apnea     Intolerant to CPAP last sleep study  . Asthma   . Pneumonia     hx  . GERD (gastroesophageal reflux disease)   . H/O hiatal hernia   . Headache     migraines  . PONV (postoperative nausea and vomiting)     patch used last time    Past Surgical History  Procedure Laterality Date  . Knee surgery  04/2009    right knee, meniscus tear  . Wrist surgery  1980s    cyst removed from right wrist  . Abdominal hysterectomy  1995    No oophorectomy  . Other surgical history  1995?    removed scar tissue from colon  . Thigh tuck Bilateral     inner thigh tuck  . Orif ankle fracture Right 10/17/2013    Procedure: OPEN REDUCTION INTERNAL FIXATION (ORIF) RIGHT ANKLE DISTAL FIBULA/LATERAL MALLEOLUS;  Surgeon: Vickey Huger, MD;  Location: [facility];  Service: Orthopedics;  Laterality: Right;    History   Social History  . Marital Status: Married    Spouse Name: N/A  . Number of Children: 1  . Years of Education: N/A   Occupational History  . Quality supervisor [employer]   Social History Main Topics   . Smoking status: Never Smoker   . Smokeless tobacco: Never Used  . Alcohol Use: No  . Drug Use: No  . Sexual Activity: Not on file   Other Topics Concern  . Not on file   Social History Narrative   Lives with husband who is transplant patient   Completed some college                    Medication List       This list is accurate as of: 04/05/14  3:53 PM.  Always use your most recent med list.               amitriptyline 25 MG tablet  Commonly known as:  ELAVIL  Take 50 mg by mouth at bedtime.     aspirin-acetaminophen-caffeine 323-557-32 MG per tablet  Commonly known as:  EXCEDRIN MIGRAINE  Take 2 tablets by mouth every 6 (six) hours as needed for headache.     buPROPion 75 MG tablet  Commonly known as:  WELLBUTRIN  Take 75 mg by mouth 2 (two) times daily.     Calcium-Vitamin D 600-200 MG-UNIT per tablet  Take 2 tablets by mouth daily.     cetirizine  10 MG tablet  Commonly known as:  ZYRTEC  Take 10 mg by mouth daily.     diphenhydramine-acetaminophen 25-500 MG Tabs  Commonly known as:  TYLENOL PM  Take 1 tablet by mouth at bedtime as needed (SLEEP).     fish oil-omega-3 fatty acids 1000 MG capsule  Take 1,000 mg by mouth daily.     fluticasone 50 MCG/ACT nasal spray  Commonly known as:  FLONASE  Place 2 sprays into both nostrils daily as needed for allergies or rhinitis.     gabapentin 300 MG capsule  Commonly known as:  NEURONTIN  Take 600 mg by mouth 3 (three) times daily.     hydrochlorothiazide 12.5 MG capsule  Commonly known as:  MICROZIDE  Take 1 capsule (12.5 mg total) by mouth daily.     montelukast 10 MG tablet  Commonly known as:  SINGULAIR  Take 10 mg by mouth at bedtime as needed (allergies).     multivitamin with minerals tablet  Take 1 tablet by mouth daily.     OSTEO BI-FLEX ADV JOINT SHIELD PO  Take 2 tablets by mouth daily.     pantoprazole 40 MG tablet  Commonly known as:  PROTONIX  Take 1 tablet (40 mg total) by  mouth daily.     rOPINIRole 0.5 MG tablet  Commonly known as:  REQUIP  Take two tablets by mouth at bedtime.     tetrahydrozoline-zinc 0.05-0.25 % ophthalmic solution  Commonly known as:  VISINE-AC  Place 2 drops into both eyes daily.     traMADol 50 MG tablet  Commonly known as:  ULTRAM  Take 1 tablet (50 mg total) by mouth every 8 (eight) hours as needed for moderate pain.     VITAMIN D (CHOLECALCIFEROL) PO  Take 5,000 Units by mouth daily.           Objective:   Physical Exam BP 124/68 mmHg  Pulse 86  Temp(Src) 98 F (36.7 C) (Oral)  Ht _0  (1.702 m)  Wt 301 lb 6 oz (136.703 kg)  BMI 47.19 kg/m2  SpO2 97%  General:   Well developed, well nourished . NAD.  HEENT:  Normocephalic . Face symmetric, atraumatic Neck no JVD at 45 Lungs:  CTA B Normal respiratory effort, no intercostal retractions, no accessory muscle use. Heart: RRR,  no murmur.  Abdomen:  Not distended, soft, non-tender. No rebound or rigidity. No mass,organomegaly Muscle skeletal: no pretibial edema bilaterally  Skin: Not pale. Not jaundice Neurologic:  alert & oriented X3.  Speech normal, gait appropriate for age and unassisted Psych--  Cognition and judgment appear intact.  Cooperative with normal attention span and concentration.  Behavior appropriate. No anxious or depressed appearing.      Assessment & Plan:    59 year old lady with history of high cholesterol, hypertension and sleep apnea who desires to proceed with bariatric surgery. She does not have any signs of symptoms of heart failure. In the past she tried Weight Watchers 3, tried another similar  weight loss program and also was seen on a "weight loss clinic" in [place] and was rx a  diet pill. She lost weight in moderation but she was not able to consistently keep it off with any of the above interventions.. Last year I did prescribe Qsymia and only helped temporarily. At this point she definitely will benefit  from losing weight, she has failed a number of conservative treatments and has a BMI of 47. I think she is  a stable medically to proceed with the bariatric surgery with the understanding that will not fix a problem for her but it will simply be another tool for her to work on her weight. She states understanding of that statement. Paperwork will be completed. For completeness we will check A1c and recheck a TSH Addendum, A1c 6.1, she also has prediabetes

## 2014-04-05 NOTE — Patient Instructions (Signed)
Get your blood work before you leave    Come back to the office by 06-2014  for a physical exam  Please schedule an appointment at the front desk    Come back fasting

## 2014-04-06 ENCOUNTER — Encounter: Payer: Self-pay | Admitting: Internal Medicine

## 2014-04-06 ENCOUNTER — Telehealth: Payer: Self-pay

## 2014-04-06 DIAGNOSIS — I10 Essential (primary) hypertension: Secondary | ICD-10-CM | POA: Insufficient documentation

## 2014-04-06 DIAGNOSIS — E785 Hyperlipidemia, unspecified: Secondary | ICD-10-CM | POA: Insufficient documentation

## 2014-04-06 DIAGNOSIS — R739 Hyperglycemia, unspecified: Secondary | ICD-10-CM | POA: Insufficient documentation

## 2014-04-06 LAB — HEMOGLOBIN A1C: HEMOGLOBIN A1C: 6.1 % (ref 4.6–6.5)

## 2014-04-06 LAB — TSH: TSH: 2.35 u[IU]/mL (ref 0.35–4.50)

## 2014-04-06 NOTE — Telephone Encounter (Signed)
Bariatric surgery form and letter completed, signed and faxed back to [facility] Form sent for scanning into chart. Patient informed via MyChart that the forms that been faxed to [facility]. Fax confirmation received 04/06/2014 at 13:59.

## 2014-04-06 NOTE — Telephone Encounter (Signed)
Copy of forms, letter of medical necessity, and OV note from 04/05/2014 placed in mail to Pt for her records.

## 2014-05-11 LAB — CBC AND DIFFERENTIAL
HCT: 37 % (ref 36–46)
Hemoglobin: 12.2 g/dL (ref 12.0–16.0)
Platelets: 264 10*3/uL (ref 150–399)
WBC: 6.5 10^3/mL

## 2014-05-11 LAB — LIPID PANEL
Cholesterol: 199 mg/dL (ref 0–200)
HDL: 67 mg/dL (ref 35–70)
LDL Cholesterol: 119 mg/dL
LDL/HDL RATIO: 3
TRIGLYCERIDES: 64 mg/dL (ref 40–160)

## 2014-05-11 LAB — HEPATIC FUNCTION PANEL
ALT: 10 U/L (ref 7–35)
AST: 14 U/L (ref 13–35)
Alkaline Phosphatase: 95 U/L (ref 25–125)
BILIRUBIN, TOTAL: 0.4 mg/dL

## 2014-05-11 LAB — BASIC METABOLIC PANEL
BUN: 17 mg/dL (ref 4–21)
Creatinine: 0.8 mg/dL (ref <=1.1)
GLUCOSE: 101 mg/dL
POTASSIUM: 4.1 mmol/L (ref 3.4–5.3)
SODIUM: 139 mmol/L (ref 137–147)

## 2014-05-11 LAB — HEMOGLOBIN A1C: Hgb A1c MFr Bld: 6 % (ref 4.0–6.0)

## 2014-05-11 LAB — TSH: TSH: 1.87 u[IU]/mL (ref <=5.90)

## 2014-05-15 ENCOUNTER — Other Ambulatory Visit: Payer: Self-pay | Admitting: General Surgery

## 2014-05-19 ENCOUNTER — Ambulatory Visit (INDEPENDENT_AMBULATORY_CARE_PROVIDER_SITE_OTHER): Payer: BLUE CROSS/BLUE SHIELD | Admitting: Cardiology

## 2014-05-19 ENCOUNTER — Encounter: Payer: Self-pay | Admitting: Cardiology

## 2014-05-19 VITALS — BP 122/84 | HR 81 | Ht 67.0 in | Wt 310.0 lb

## 2014-05-19 DIAGNOSIS — I10 Essential (primary) hypertension: Secondary | ICD-10-CM | POA: Diagnosis not present

## 2014-05-19 DIAGNOSIS — Z01818 Encounter for other preprocedural examination: Secondary | ICD-10-CM

## 2014-05-19 DIAGNOSIS — G4733 Obstructive sleep apnea (adult) (pediatric): Secondary | ICD-10-CM

## 2014-05-19 NOTE — Progress Notes (Signed)
Cardiology Office Note   Date:  05/19/2014   ID:  BREAH JOA, DOB 1955/08/02, MRN [id number]  PCP:  Kathlene November, MD    Chief Complaint  Patient presents with  . New Evaluation     clearance for weight loss surgery      History of Present Illness: Taylor Silva is a 59 y.o. female who presents for preoperative clearance for weight loss surgery.  She has a history of OSA but only used CPAP for a month due to intolerance with the mask.  She says that she had an episode of chest pain a week ago that felt like a pinching sensation that was off and on for 2 days and then resolved.  She denies any exertional chest pain or pressure.  She has chronic DOE going up stairs that has worsened with weight gain.  She denies any palpitations, dizziness, or syncope.  She has had some LE edema recently.  She is not compliant with a low sodium diet.   Past Medical History  Diagnosis Date  . Fructose intolerance   . Arthritis   . Seasonal allergies   . Restless legs   . Depression   . Hyperlipidemia   . Obesity   . Heart murmur     echo done 80's neg  . Hypertension   . Sleep apnea     Intolerant to CPAP last sleep study  . Asthma   . Pneumonia     hx  . GERD (gastroesophageal reflux disease)   . H/O hiatal hernia   . Headache     migraines  . PONV (postoperative nausea and vomiting)     patch used last time  . OSA (obstructive sleep apnea)   . Pre-diabetes   . DJD (degenerative joint disease), lumbar     Past Surgical History  Procedure Laterality Date  . Knee surgery  04/2009    right knee, meniscus tear  . Wrist surgery  1980s    cyst removed from right wrist  . Abdominal hysterectomy  1995    No oophorectomy  . Other surgical history  1995?    removed scar tissue from colon  . Thigh tuck Bilateral     inner thigh tuck  . Orif ankle fracture Right 10/17/2013    Procedure: OPEN REDUCTION INTERNAL FIXATION (ORIF) RIGHT ANKLE DISTAL FIBULA/LATERAL MALLEOLUS;   Surgeon: Vickey Huger, MD;  Location: [facility];  Service: Orthopedics;  Laterality: Right;     Current Outpatient Prescriptions  Medication Sig Dispense Refill  . amitriptyline (ELAVIL) 25 MG tablet Take 50 mg by mouth at bedtime.     Marland Kitchen aspirin-acetaminophen-caffeine (EXCEDRIN MIGRAINE) 250-250-65 MG per tablet Take 2 tablets by mouth every 6 (six) hours as needed for headache.     Marland Kitchen buPROPion (WELLBUTRIN) 75 MG tablet Take 75 mg by mouth 2 (two) times daily.    . Calcium-Vitamin D 600-200 MG-UNIT per tablet Take 2 tablets by mouth daily.    . cetirizine (ZYRTEC) 10 MG tablet Take 10 mg by mouth daily.    . diphenhydramine-acetaminophen (TYLENOL PM) 25-500 MG TABS Take 1 tablet by mouth at bedtime as needed (SLEEP).     . fish oil-omega-3 fatty acids 1000 MG capsule Take 1,000 mg by mouth daily.     . fluticasone (FLONASE) 50 MCG/ACT nasal spray Place 2 sprays into both nostrils daily as needed for allergies or rhinitis.    Marland Kitchen gabapentin (NEURONTIN) 300 MG capsule Take 600 mg by mouth  3 (three) times daily.    . hydrochlorothiazide (MICROZIDE) 12.5 MG capsule Take 1 capsule (12.5 mg total) by mouth daily. 180 capsule 1  . Misc Natural Products (OSTEO BI-FLEX ADV JOINT SHIELD PO) Take 2 tablets by mouth daily.    . montelukast (SINGULAIR) 10 MG tablet Take 10 mg by mouth at bedtime as needed (allergies).     . Multiple Vitamins-Minerals (MULTIVITAMIN WITH MINERALS) tablet Take 1 tablet by mouth daily.      . pantoprazole (PROTONIX) 40 MG tablet Take 1 tablet (40 mg total) by mouth daily. 90 tablet 1  . Pseudoeph-Doxylamine-DM-APAP (NIGHTTIME COLD PO) Take by mouth.    Marland Kitchen rOPINIRole (REQUIP) 0.5 MG tablet Take two tablets by mouth at bedtime. 60 tablet 5  . tetrahydrozoline-zinc (VISINE-AC) 0.05-0.25 % ophthalmic solution Place 2 drops into both eyes daily.    . traMADol (ULTRAM) 50 MG tablet Take 1 tablet (50 mg total) by mouth every 8 (eight) hours as needed for moderate pain. (Patient taking  differently: Take 100 mg by mouth every 8 (eight) hours as needed for moderate pain. Pt states that she takes 2 tablets by mouth every 8 hours, w/ 1 tablet by mouth of the tylenol) 90 tablet 0  . VITAMIN D, CHOLECALCIFEROL, PO Take 5,000 Units by mouth daily.     . [DISCONTINUED] estrogens, conjugated, (PREMARIN) 0.625 MG tablet Take 0.625 mg by mouth daily. Take daily for 21 days then do not take for 7 days.     . [DISCONTINUED] fexofenadine (ALLEGRA) 180 MG tablet Take 180 mg by mouth daily.      . [DISCONTINUED] topiramate (TOPAMAX) 100 MG tablet Take 100 mg by mouth at bedtime.       No current facility-administered medications for this visit.    Allergies:   Codeine; Erythromycin; Tetracyclines & related; Other; Penicillins; and Sulfa antibiotics    Social History:  The patient  reports that she has never smoked. She has never used smokeless tobacco. She reports that she does not drink alcohol or use illicit drugs.   Family History:  The patient's family history includes Breast cancer in her other; Diabetes in her brother, maternal grandmother, mother, and sister; Heart attack in her brother and mother; Heart disease in her father; Hyperlipidemia in her brother, maternal grandmother, mother, and sister; Hypertension in her brother, maternal grandmother, mother, and sister; Leukemia in her father. There is no history of Colon cancer.    ROS:  Please see the history of present illness.   Otherwise, review of systems are positive for none.   All other systems are reviewed and negative.    PHYSICAL EXAM: VS:  BP 122/84 mmHg  Pulse 81  Ht 5\' 7"  (1.702 m)  Wt 310 lb (140.615 kg)  BMI 48.54 kg/m2 , BMI Body mass index is 48.54 kg/(m^2). GEN: Well nourished, well developed, in no acute distress HEENT: normal Neck: no JVD, carotid bruits, or masses Cardiac: RRR; no murmurs, rubs, or gallops,no edema  Respiratory:  clear to auscultation bilaterally, normal work of breathing GI: soft,  nontender, nondistended, + BS MS: no deformity or atrophy Skin: warm and dry, no rash Neuro:  Strength and sensation are intact Psych: euthymic mood, full affect   EKG:  EKG is ordered today. The ekg ordered today demonstrates NSR with no ST changes   Recent Labs: 05/11/2014: ALT 10; BUN 17; Creatinine 0.8; Hemoglobin 12.2; Platelets 264; Potassium 4.1; Sodium 139; TSH 1.87    Lipid Panel    Component Value  Date/Time   CHOL 199 05/11/2014   TRIG 64 05/11/2014   HDL 67 05/11/2014   CHOLHDL 4 06/24/2013 1135   VLDL 14.4 06/24/2013 1135   LDLCALC 119 05/11/2014      Wt Readings from Last 3 Encounters:  05/19/14 310 lb (140.615 kg)  04/05/14 301 lb 6 oz (136.703 kg)  01/10/14 286 lb (129.729 kg)        ASSESSMENT AND PLAN:  1.  Morbid obesity being evaluated for bariatric surgery. 2.  Preoperative clearance for surgery.  I will get a 2 day Lexiscan myoview to rule out ischemia.  Her CRF include obesity, HTN, family history of premature CAD and pre diabetes.  I will get a 2D echo to assess LVF.   3.  HTN - controlled 4.  OSA intolerant to CPAP.  She does not want to proceed with repeat in lab PSG.  I will get a home sleep study to evaluate further   Current medicines are reviewed at length with the patient today.  The patient does not have concerns regarding medicines.  The following changes have been made:  no change  Labs/ tests ordered today include: see above assessment and plan No orders of the defined types were placed in this encounter.     Disposition:   FU with me in PRN pending results of studies SignedSueanne Margarita, MD  05/19/2014 11:36 AM    [facility] [place], [place], [place]  [address] Phone: [phone]; Fax: [phone]

## 2014-05-19 NOTE — Patient Instructions (Signed)
Medication Instructions:  Your physician recommends that you continue on your current medications as directed. Please refer to the Current Medication list given to you today.   Labwork: None  Testing/Procedures: Your physician has requested that you have an echocardiogram. Echocardiography is a painless test that uses sound waves to create images of your heart. It provides your doctor with information about the size and shape of your heart and how well your heart's chambers and valves are working. This procedure takes approximately one hour. There are no restrictions for this procedure.   Your physician has requested that you have a lexiscan myoview. For further information please visit [email or web address]. Please follow instruction sheet, as given.  Follow-Up: Your physician recommends that you schedule a follow-up appointment AS NEEDED with Dr. Radford Pax pending your study results.  Any Other Special Instructions Will Be Listed Below (If Applicable). You will be contacted with a time and date for your home sleep study.

## 2014-05-25 ENCOUNTER — Other Ambulatory Visit: Payer: Self-pay | Admitting: General Surgery

## 2014-05-30 ENCOUNTER — Telehealth: Payer: Self-pay

## 2014-05-30 ENCOUNTER — Telehealth (HOSPITAL_COMMUNITY): Payer: Self-pay | Admitting: *Deleted

## 2014-05-30 MED ORDER — ROPINIROLE HCL 0.5 MG PO TABS: 1.0000 mg | ORAL_TABLET | Freq: Every day | ORAL | Status: DC

## 2014-05-30 NOTE — Telephone Encounter (Signed)
Ok 60 and 5 RF

## 2014-05-30 NOTE — Telephone Encounter (Signed)
Rx sent to [facility] pharmacy.  

## 2014-05-30 NOTE — Telephone Encounter (Signed)
Patient's husband(emergency contact) given detailed instructions per Myocardial Perfusion Study Information Sheet for test on 05/31/14 at 1130 Patient verbalized understanding. Bailea Beed, Ranae Palms

## 2014-05-30 NOTE — Telephone Encounter (Signed)
Pt is requesting refill on Requip.  Last OV: 04/05/2014 Last Fill: 08/24/2013 #60 5RF UDS: 06/24/2013 Low risk  Please advise.

## 2014-05-31 ENCOUNTER — Ambulatory Visit (HOSPITAL_COMMUNITY): Payer: BLUE CROSS/BLUE SHIELD | Attending: Cardiology

## 2014-05-31 ENCOUNTER — Ambulatory Visit (HOSPITAL_BASED_OUTPATIENT_CLINIC_OR_DEPARTMENT_OTHER): Payer: BLUE CROSS/BLUE SHIELD

## 2014-05-31 ENCOUNTER — Other Ambulatory Visit: Payer: Self-pay

## 2014-05-31 DIAGNOSIS — Z01818 Encounter for other preprocedural examination: Secondary | ICD-10-CM

## 2014-05-31 DIAGNOSIS — E785 Hyperlipidemia, unspecified: Secondary | ICD-10-CM | POA: Insufficient documentation

## 2014-05-31 DIAGNOSIS — I10 Essential (primary) hypertension: Secondary | ICD-10-CM | POA: Diagnosis not present

## 2014-05-31 DIAGNOSIS — Z6841 Body Mass Index (BMI) 40.0 and over, adult: Secondary | ICD-10-CM | POA: Diagnosis not present

## 2014-05-31 DIAGNOSIS — G4733 Obstructive sleep apnea (adult) (pediatric): Secondary | ICD-10-CM | POA: Diagnosis not present

## 2014-05-31 MED ORDER — REGADENOSON 0.4 MG/5ML IV SOLN
0.4000 mg | Freq: Once | INTRAVENOUS | Status: AC
Start: 2014-05-31 — End: 2014-05-31
  Administered 2014-05-31: 0.4 mg via INTRAVENOUS

## 2014-05-31 MED ORDER — TECHNETIUM TC 99M SESTAMIBI GENERIC - CARDIOLITE
33.0000 | Freq: Once | INTRAVENOUS | Status: AC | PRN
  Administered 2014-05-31: 33 via INTRAVENOUS

## 2014-06-01 ENCOUNTER — Ambulatory Visit (HOSPITAL_COMMUNITY): Payer: BLUE CROSS/BLUE SHIELD | Attending: Internal Medicine

## 2014-06-01 LAB — MYOCARDIAL PERFUSION IMAGING
CHL CUP STRESS STAGE 1 DBP: 81 mmHg
CHL CUP STRESS STAGE 1 GRADE: 0 %
CHL CUP STRESS STAGE 1 HR: 85 {beats}/min
CHL CUP STRESS STAGE 1 SPEED: 0 mph
CHL CUP STRESS STAGE 2 GRADE: 0 %
CHL CUP STRESS STAGE 2 HR: 85 {beats}/min
CHL CUP STRESS STAGE 3 DBP: 104 mmHg
CHL CUP STRESS STAGE 3 HR: 109 {beats}/min
CHL CUP STRESS STAGE 3 SPEED: 0 mph
CHL CUP STRESS STAGE 5 GRADE: 0 %
CHL CUP STRESS STAGE 5 SBP: 121 mmHg
CHL CUP STRESS STAGE 6 DBP: 75 mmHg
CHL CUP STRESS STAGE 6 GRADE: 0 %
CHL CUP STRESS STAGE 6 HR: 96 {beats}/min
CSEPEW: 1 METS
LV dias vol: 108 mL
LV sys vol: 45 mL
NUC STRESS TID: 1.14
Nuc Stress EF: 58 %
Peak HR: 112 {beats}/min
Percent of predicted max HR: 68 %
RATE: 0.32
Rest HR: 85 {beats}/min
SDS: 1
SRS: 3
SSS: 4
Stage 1 SBP: 136 mmHg
Stage 2 Speed: 0 mph
Stage 3 Grade: 0 %
Stage 3 SBP: 132 mmHg
Stage 4 Grade: 0 %
Stage 4 HR: 112 {beats}/min
Stage 4 Speed: 0 mph
Stage 5 DBP: 95 mmHg
Stage 5 HR: 114 {beats}/min
Stage 5 Speed: 0 mph
Stage 6 SBP: 110 mmHg
Stage 6 Speed: 0 mph

## 2014-06-01 MED ORDER — TECHNETIUM TC 99M SESTAMIBI GENERIC - CARDIOLITE
33.0000 | Freq: Once | INTRAVENOUS | Status: AC | PRN
  Administered 2014-06-01: 33 via INTRAVENOUS

## 2014-06-02 ENCOUNTER — Telehealth: Payer: Self-pay | Admitting: *Deleted

## 2014-06-02 NOTE — Telephone Encounter (Signed)
Called pt to let her know that her home sleep study is scheduled for June 15th.   I let her know that she will be getting a packet of information in the mail from [facility] explaining everything that she needs to do.  She stated verbal understanding.

## 2014-06-21 ENCOUNTER — Ambulatory Visit (HOSPITAL_BASED_OUTPATIENT_CLINIC_OR_DEPARTMENT_OTHER): Payer: BLUE CROSS/BLUE SHIELD | Attending: Cardiology | Admitting: Radiology

## 2014-06-21 VITALS — Ht 66.0 in

## 2014-06-21 DIAGNOSIS — G4733 Obstructive sleep apnea (adult) (pediatric): Secondary | ICD-10-CM | POA: Diagnosis present

## 2014-06-21 DIAGNOSIS — R0683 Snoring: Secondary | ICD-10-CM | POA: Diagnosis not present

## 2014-06-22 ENCOUNTER — Ambulatory Visit (HOSPITAL_COMMUNITY)
Admission: RE | Admit: 2014-06-22 | Discharge: 2014-06-22 | Disposition: A | Discharge status: Home / Self Care | Payer: BLUE CROSS/BLUE SHIELD | Source: Ambulatory Visit | Attending: General Surgery | Admitting: General Surgery

## 2014-06-22 DIAGNOSIS — R7309 Other abnormal glucose: Secondary | ICD-10-CM | POA: Diagnosis not present

## 2014-06-22 DIAGNOSIS — I10 Essential (primary) hypertension: Secondary | ICD-10-CM | POA: Insufficient documentation

## 2014-06-22 DIAGNOSIS — F329 Major depressive disorder, single episode, unspecified: Secondary | ICD-10-CM | POA: Diagnosis not present

## 2014-06-22 DIAGNOSIS — K449 Diaphragmatic hernia without obstruction or gangrene: Secondary | ICD-10-CM | POA: Insufficient documentation

## 2014-06-22 DIAGNOSIS — E785 Hyperlipidemia, unspecified: Secondary | ICD-10-CM | POA: Diagnosis not present

## 2014-06-22 DIAGNOSIS — E748 Other specified disorders of carbohydrate metabolism: Secondary | ICD-10-CM | POA: Diagnosis not present

## 2014-06-22 DIAGNOSIS — M25561 Pain in right knee: Secondary | ICD-10-CM | POA: Diagnosis not present

## 2014-06-22 DIAGNOSIS — M79673 Pain in unspecified foot: Secondary | ICD-10-CM | POA: Insufficient documentation

## 2014-06-22 DIAGNOSIS — G4733 Obstructive sleep apnea (adult) (pediatric): Secondary | ICD-10-CM | POA: Insufficient documentation

## 2014-06-22 DIAGNOSIS — M549 Dorsalgia, unspecified: Secondary | ICD-10-CM | POA: Diagnosis not present

## 2014-06-23 ENCOUNTER — Ambulatory Visit (HOSPITAL_COMMUNITY)
Admission: RE | Admit: 2014-06-23 | Discharge: 2014-06-23 | Disposition: A | Discharge status: Home / Self Care | Payer: BLUE CROSS/BLUE SHIELD | Source: Ambulatory Visit | Attending: General Surgery | Admitting: General Surgery

## 2014-06-23 ENCOUNTER — Encounter (HOSPITAL_COMMUNITY)
Admission: RE | Disposition: A | Discharge status: Home / Self Care | Payer: Self-pay | Source: Ambulatory Visit | Attending: General Surgery

## 2014-06-23 HISTORY — PX: BREATH TEK H PYLORI: SHX5422

## 2014-06-23 SURGERY — BREATH TEST, FOR HELICOBACTER PYLORI

## 2014-06-23 NOTE — Progress Notes (Signed)
   06/23/14 [place]  Referring MD Greer Pickerel  Time of Last PO Intake 2200 (06/22/14)  Baseline Breath At: 0801  Pranactin Given At: 0803  Post-Dose Breath At: 0818  Sample 1 1.7  Sample 2 2.3  Test Negative

## 2014-06-26 ENCOUNTER — Encounter (HOSPITAL_COMMUNITY): Payer: Self-pay | Admitting: General Surgery

## 2014-06-28 ENCOUNTER — Encounter: Payer: Self-pay | Admitting: Dietician

## 2014-06-28 ENCOUNTER — Encounter: Payer: BLUE CROSS/BLUE SHIELD | Attending: Internal Medicine | Admitting: Dietician

## 2014-06-28 VITALS — Ht 65.0 in | Wt 313.5 lb

## 2014-06-28 DIAGNOSIS — Z6841 Body Mass Index (BMI) 40.0 and over, adult: Secondary | ICD-10-CM | POA: Insufficient documentation

## 2014-06-28 DIAGNOSIS — Z713 Dietary counseling and surveillance: Secondary | ICD-10-CM | POA: Diagnosis not present

## 2014-06-28 DIAGNOSIS — E669 Obesity, unspecified: Secondary | ICD-10-CM | POA: Diagnosis not present

## 2014-06-28 NOTE — Patient Instructions (Signed)

## 2014-06-28 NOTE — Progress Notes (Signed)
  Pre-Op Assessment Visit:  Pre-Operative Sleeve Gastrectomy Surgery  Medical Nutrition Therapy:  Appt start time: 7494   End time:  1500.  Patient was seen on 06/28/2014 for Pre-Operative Nutrition Assessment. Assessment and letter of approval faxed to [facility] Bariatric [facility] Program coordinator on 06/28/2014.   Preferred Learning Style:   No preference indicated   Learning Readiness:   Ready  Handouts given during visit include:  Pre-Op Goals Bariatric Surgery Protein Shakes   During the appointment today the following Pre-Op Goals were reviewed with the patient: Maintain or lose weight as instructed by your surgeon Make healthy food choices Begin to limit portion sizes Limited concentrated sugars and fried foods Keep fat/sugar in the single digits per serving on   food labels Practice CHEWING your food  (aim for 30 chews per bite or until applesauce consistency) Practice not drinking 15 minutes before, during, and 30 minutes after each meal/snack Avoid all carbonated beverages  Avoid/limit caffeinated beverages  Avoid all sugar-sweetened beverages Consume 3 meals per day; eat every 3-5 hours Make a list of non-food related activities Aim for 64-100 ounces of FLUID daily  Aim for at least 60-80 grams of PROTEIN daily Look for a liquid protein source that contain ?15 g protein and ?5 g carbohydrate  (ex: shakes, drinks, shots)  Patient-Centered Goals: Improve knee/joint pain, would like to be able to play with new grandchild 10 level of confidence/ 10 level of importance  Demonstrated degree of understanding via:  Teach Back  Teaching Method Utilized:  Visual Auditory Hands on  Barriers to learning/adherence to lifestyle change: none  [facility] to call the [facility] to enroll in Pre-Op and Post-Op Nutrition Education when surgery date is scheduled.

## 2014-07-05 ENCOUNTER — Other Ambulatory Visit: Payer: Self-pay

## 2014-07-05 MED ORDER — BUPROPION HCL 75 MG PO TABS: 75.0000 mg | ORAL_TABLET | Freq: Two times a day (BID) | ORAL | Status: DC

## 2014-07-19 ENCOUNTER — Other Ambulatory Visit: Payer: Self-pay | Admitting: Internal Medicine

## 2014-07-20 MED ORDER — TRAMADOL HCL 50 MG PO TABS: 50.0000 mg | ORAL_TABLET | Freq: Three times a day (TID) | ORAL | Status: DC | PRN

## 2014-07-20 NOTE — Telephone Encounter (Signed)
Pt is requesting refill on Tramadol.   Last OV: 04/05/2014 Last Fill: 11/07/2013 #90 0RF UDS: 06/24/2013 Low risk   Please advise.

## 2014-07-20 NOTE — Telephone Encounter (Addendum)
Rx faxed to [facility] pharmacy  

## 2014-07-20 NOTE — Telephone Encounter (Signed)
Ok 90, no rf

## 2014-08-13 ENCOUNTER — Other Ambulatory Visit: Payer: Self-pay | Admitting: Internal Medicine

## 2014-08-21 ENCOUNTER — Other Ambulatory Visit: Payer: Self-pay | Admitting: Internal Medicine

## 2014-08-31 ENCOUNTER — Telehealth: Payer: Self-pay | Admitting: *Deleted

## 2014-08-31 ENCOUNTER — Ambulatory Visit: Payer: Self-pay | Admitting: General Surgery

## 2014-08-31 NOTE — Telephone Encounter (Signed)
Erroneous encounter

## 2014-09-05 ENCOUNTER — Telehealth: Payer: Self-pay | Admitting: *Deleted

## 2014-09-05 NOTE — Telephone Encounter (Signed)
Sleep lab has been made aware.

## 2014-09-05 NOTE — Telephone Encounter (Signed)
Sleep Lab is calling to say that sleep study from June 15th has not been read. They routed it to you and wanted to know if you could see it.   Their Billing department is asking for dictation.   Routed to Dr. Radford Pax

## 2014-09-05 NOTE — Telephone Encounter (Signed)
This was a home sleep study and Dr. Annamaria Boots is going to read it

## 2014-09-09 ENCOUNTER — Telehealth: Payer: Self-pay | Admitting: Cardiology

## 2014-09-09 DIAGNOSIS — G4733 Obstructive sleep apnea (adult) (pediatric): Secondary | ICD-10-CM | POA: Diagnosis not present

## 2014-09-09 DIAGNOSIS — R0683 Snoring: Secondary | ICD-10-CM | POA: Diagnosis not present

## 2014-09-09 NOTE — Progress Notes (Signed)
    NAME: KALIA VAHEY DATE OF BIRTH:  February 16, 1955 MEDICAL RECORD NUMBER [id number]  LOCATION: [facility]  PHYSICIAN: Jasma Seevers D  DATE OF STUDY: 06/21/2014  SLEEP STUDY TYPE: Out of Center Sleep Test- unattended                REFERRING PHYSICIAN: Sueanne Margarita, MD  INDICATION FOR [place] with sleep apnea  EPWORTH SLEEPINESS SCORE:  13/24 HEIGHT: 5\' 6"  (167.6 cm)  WEIGHT: 300 lbs    BMI 47   MEDICATIONS: Charted for review  IMPRESSION:  Moderate obstructive sleep apnea/ hypopnea syndrome, AHI 20.4/ hr.Snoring with oxygen desaturation to a nadir of 75% with mean oxygen saturation 93% on room air. Average heart rate 81.6/ min.    RECOMMENDATION: Weight loss and CPAP would be the primary therapeutic considerations. Individual circumstances may suggest other approaches based on clinical judgment.  Deneise Lever Diplomate, American Board of Sleep Medicine  ELECTRONICALLY SIGNED ON:  09/09/2014, 1:24 PM [place] PH: [phone]   FX: [phone] [place]

## 2014-09-09 NOTE — Telephone Encounter (Signed)
Please let patient know that she has moderate OSA with significant oxygen desaturations and set up for inpatient CPAP titration

## 2014-09-12 NOTE — Telephone Encounter (Signed)
Patient is aware of results.Stated verbal understanding. She is having weight loss surgery in a couple weeks, and she would like to talk to the doctor doing her surgery before proceeding. She stated that she goes to see him on the 16th, and she will call me back to discuss.

## 2014-09-18 ENCOUNTER — Encounter: Payer: BLUE CROSS/BLUE SHIELD | Attending: Internal Medicine

## 2014-09-18 DIAGNOSIS — Z6841 Body Mass Index (BMI) 40.0 and over, adult: Secondary | ICD-10-CM | POA: Diagnosis not present

## 2014-09-18 DIAGNOSIS — E669 Obesity, unspecified: Secondary | ICD-10-CM | POA: Diagnosis present

## 2014-09-18 DIAGNOSIS — Z713 Dietary counseling and surveillance: Secondary | ICD-10-CM | POA: Diagnosis not present

## 2014-09-19 NOTE — Progress Notes (Signed)
  Pre-Operative Nutrition Class:  Appt start time: 8022   End time:  1830.  Patient was seen on 09/19/14 for Pre-Operative Bariatric Surgery Education at the [facility].   Surgery date: 10/03/14 Surgery type: Sleeve gastrectomy Start weight at [facility]: 313.5 lbs on 06/28/14 Weight today: 312.7  TANITA  BODY COMP RESULTS  09/18/14   BMI (kg/m^2) N/A   Fat Mass (lbs)    Fat Free Mass (lbs)    Total Body Water (lbs)     The following the learning objectives were met by the patient during this course:  Identify Pre-Op Dietary Goals and will begin 2 weeks pre-operatively  Identify appropriate sources of fluids and proteins   State protein recommendations and appropriate sources pre and post-operatively  Identify Post-Operative Dietary Goals and will follow for 2 weeks post-operatively  Identify appropriate multivitamin and calcium sources  Describe the need for physical activity post-operatively and will follow MD recommendations  State when to call healthcare provider regarding medication questions or post-operative complications  Handouts given during class include:  Pre-Op Bariatric Surgery Diet Handout  Protein Shake Handout  Post-Op Bariatric Surgery Nutrition Handout  [facility]T Program Information Flyer  Support Group Information Flyer  [facility] Bariatric Supplements Price List  Follow-Up Plan: Patient will follow-up at [facility] 2 weeks post operatively for diet advancement per MD.

## 2014-09-22 ENCOUNTER — Ambulatory Visit: Payer: Self-pay | Admitting: General Surgery

## 2014-09-22 NOTE — H&P (Signed)
Taylor Silva 09/22/2014 11:52 AM Location: [facility] Patient #: [id number] DOB: [birth year] Married / Language: Cleophus Molt / Race: White Female History of Present Illness Randall Hiss M. Wilson MD; 09/22/2014 12:25 PM) The patient is a 59 year old female who presents for a preoperative evaluation. She comes in today for her preoperative visit. She has been approved for laparoscopic sleeve gastrectomy with possible hiatal hernia repair. Her initial visit weight was 312 pounds. She has been seen and evaluated by Dr. Radford Pax and cardiology. She denies any medical changes since her initial visit. She does state that she does have daily heartburn. She takes Protonix. She states occasionally she feels like something gets stuck in her throat. She does not regurgitate. Occasionally she will feel nauseous if she drinks something or eat something that is greasy. She denies any right upper quadrant or epigastric pain per se. Her upper GI did demonstrate a hiatal hernia there was no evidence of esophageal dysmotility, stricture, or narrowing or reflux on her upper GI. Her hemoglobin A1c was 6. Her LDL level was 119.  She denies any chest pain, chest pressure, shortness of breath, orthopnea. She does have obstructive sleep apnea and has tried numerous mask but is not able to tolerate any of them. Problem List/Past Medical Randall Hiss Ronnie Derby, MD; 09/22/2014 12:25 PM) OBSTRUCTIVE SLEEP APNEA, ADULT (G47.33) ESSENTIAL HYPERTENSION WITH GOAL BLOOD PRESSURE LESS THAN 130/80 (I10) GASTROESOPHAGEAL REFLUX DISEASE, ESOPHAGITIS PRESENCE NOT SPECIFIED (K21.9) PREDIABETES (R73.09) OSTEOARTHRITIS OF LUMBAR SPINE, UNSPECIFIED SPINAL OSTEOARTHRITIS COMPLICATION STATUS (E45.409) HIATAL HERNIA (K44.9) OBESITY, MORBID, BMI 50 OR HIGHER (E66.01)  Other Problems [name], MD; 09/22/2014 12:25 PM) Arthritis Asthma Back Pain Depression Gastroesophageal Reflux Disease Sleep Apnea  Past  Surgical History Elbert Ewings, CMA; 09/22/2014 11:52 AM) Foot Surgery Right. Hysterectomy (not due to cancer) - Partial Knee Surgery Right.  Diagnostic Studies History [name], MD; 09/22/2014 12:25 PM) Mammogram 1-3 years ago Pap Smear 1-5 years ago Colonoscopy 1-5 years ago  Allergies Elbert Ewings, CMA; 09/22/2014 11:53 AM) Codeine Phosphate *ANALGESICS - OPIOID* Anaphylaxis. Erythromycin-Sulfisoxazole *Anti-infective Agents - Misc.** Hives. Tetracycline *CHEMICALS* Hives. Penicillin G Pot in Dextrose *PENICILLINS* Sulfa 10 *OPHTHALMIC AGENTS* Nausea, Vomiting.  Medication History [name], MD; 09/22/2014 12:25 PM) Amitriptyline HCl (25MG  Tablet, Oral) Active. Aspirin-Acetaminophen-Caffeine (250-250-65MG  Tablet, Oral) Active. Wellbutrin (75MG  Tablet, Oral) Active. ZyrTEC Allergy (10MG  Capsule, Oral) Active. Tylenol Cold Relief Nighttime (25-500MG  Tablet, Oral) Active. Fish Oil Burp-Less (1000MG  Capsule, Oral) Active. Flonase (50MCG/ACT Suspension, Nasal) Active. Gabapentin (300MG  Capsule, Oral) Active. Microzide (12.5MG  Capsule, Oral) Active. Singulair (10MG  Tablet, Oral) Active. Multiple Vitamin (Oral) Active. Protonix (40MG  Tablet DR, Oral) Active. ROPINIRole HCl (0.5MG  Tablet, Oral) Active. TraMADol HCl (50MG  Tablet, Oral) Active. Vitamin D (Cholecalciferol) (1000UNIT Tablet, Oral) Active. Medications Reconciled Zofran ODT (4MG  Tablet Disperse, 1 (one) Tablet Disperse Oral every six hours, as needed, Taken starting 09/22/2014) Active.  Social History Elbert Ewings, [place]; 09/22/2014 11:52 AM) Alcohol use Occasional alcohol use. Caffeine use Carbonated beverages, Coffee, Tea. No drug use Tobacco use Never smoker.  Family History Elbert Ewings, [place]; 09/22/2014 11:52 AM) Arthritis Father, Mother, Sister. Cancer Father. Depression Son. Diabetes Mellitus Brother, Mother, Sister. Heart Disease Father. Hypertension Brother, Father,  Mother, Sister. Kidney Disease Mother. Thyroid problems Brother, Mother, Sister.  Pregnancy / Birth History Elbert Ewings, CMA; 09/22/2014 11:52 AM) Age at menarche 71 years. Age of menopause 13-60 Gravida 1 Maternal age 40-30 Para 1     Review of Systems [name], MD; 09/22/2014 12:25 00) General Present-  Fatigue, Night Sweats and Weight Gain. Not Present- Appetite Loss, Chills, Fever and Weight Loss. Skin Not Present- Change in Wart/Mole, Dryness, Hives, Jaundice, New Lesions, Non-Healing Wounds, Rash and Ulcer. HEENT Present- Earache, Hearing Loss, Ringing in the Ears, Seasonal Allergies, Sinus Pain and Wears glasses/contact lenses. Not Present- Hoarseness, Nose Bleed, Oral Ulcers, Sore Throat, Visual Disturbances and Yellow Eyes. Respiratory Present- Snoring and Wheezing. Not Present- Bloody sputum, Chronic Cough and Difficulty Breathing. Breast Not Present- Breast Mass, Breast Pain, Nipple Discharge and Skin Changes. Cardiovascular Not Present- Chest Pain, Difficulty Breathing Lying Down, Leg Cramps, Palpitations, Rapid Heart Rate, Shortness of Breath and Swelling of Extremities. Gastrointestinal Present- Constipation. Not Present- Abdominal Pain, Bloating, Bloody Stool, Change in Bowel Habits, Chronic diarrhea, Difficulty Swallowing, Excessive gas, Gets full quickly at meals, Hemorrhoids, Indigestion, Nausea, Rectal Pain and Vomiting. Female Genitourinary Not Present- Frequency, Nocturia, Painful Urination, Pelvic Pain and Urgency. Musculoskeletal Present- Back Pain, Joint Pain and Joint Stiffness. Not Present- Muscle Pain, Muscle Weakness and Swelling of Extremities. Neurological Not Present- Decreased Memory, Fainting, Headaches, Numbness, Seizures, Tingling, Tremor, Trouble walking and Weakness. Psychiatric Present- Depression. Not Present- Anxiety, Bipolar, Change in Sleep Pattern, Fearful and Frequent crying. Hematology Not Present- Easy Bruising, Excessive bleeding,  Gland problems, HIV and Persistent Infections.  Vitals Elbert Ewings CMA; 09/22/2014 11:53 AM) 09/22/2014 11:53 AM Weight: 308 lb Height: 65in Body Surface Area: 2.53 m Body Mass Index: 51.25 kg/m Temp.: 97.55F(Temporal)  Pulse: 119 (Regular)  BP: 130/72 (Sitting, Left Arm, Standard)     Physical Exam Randall Hiss M. Wilson MD; 09/22/2014 12:20 PM)  General Mental Status-Alert. General Appearance-Consistent with stated age. Hydration-Well hydrated. Voice-Normal. Note: morbidly obese   Head and Neck Head-normocephalic, atraumatic with no lesions or palpable masses. Trachea-midline. Thyroid Gland Characteristics - normal size and consistency.  Eye Eyeball - Bilateral-Extraocular movements intact. Sclera/Conjunctiva - Bilateral-No scleral icterus.  Chest and Lung Exam Chest and lung exam reveals -quiet, even and easy respiratory effort with no use of accessory muscles and on auscultation, normal breath sounds, no adventitious sounds and normal vocal resonance. Inspection Chest Wall - Normal. Back - normal.  Breast - Did not examine.  Cardiovascular Cardiovascular examination reveals -normal heart sounds, regular rate and rhythm with no murmurs and normal pedal pulses bilaterally.  Abdomen Inspection Inspection of the abdomen reveals - No Hernias. Skin - Scar - no surgical scars. Palpation/Percussion Palpation and Percussion of the abdomen reveal - Soft, Non Tender, No Rebound tenderness, No Rigidity (guarding) and No hepatosplenomegaly. Auscultation Auscultation of the abdomen reveals - Bowel sounds normal.  Peripheral Vascular Upper Extremity Palpation - Pulses bilaterally normal.  Neurologic Neurologic evaluation reveals -alert and oriented x 3 with no impairment of recent or remote memory. Mental Status-Normal.  Neuropsychiatric The patient's mood and affect are described as -normal. Judgment and Insight-insight is  appropriate concerning matters relevant to self.  Musculoskeletal Normal Exam - Left-Upper Extremity Strength Normal and Lower Extremity Strength Normal. Normal Exam - Right-Upper Extremity Strength Normal and Lower Extremity Strength Normal. Note: bilateral knee crepitus   Lymphatic Head & Neck  General Head & Neck Lymphatics: Bilateral - Description - Normal. Axillary - Did not examine. Femoral & Inguinal - Did not examine.    Assessment & Plan Randall Hiss M. Wilson MD; 09/22/2014 12:25 PM)  OBESITY, MORBID, BMI 50 OR HIGHER (E66.01) Impression: We discussed her preoperative workup. We discussed the findings of a hiatal hernia. Since she does have to take reflux medication I advised her that we would test for the hiatal  hernia during surgery and if found to have a clinically significant one I recommended repair during surgery. We discussed what repair with involve along with the risk and benefits. I'm not sure what to make of her complaint of feeling like something occasionally gets stuck. Her upper GI showed no evidence of stricture, narrowing, dysmotility or any significant reflux. I think we can proceed with a sleeve gastrectomy. It is perhaps her hiatal hernia which is causing some of the symptoms. Because of her issues with narcotics in the past with sensitivity we will not give her her postoperative pain medicine prescription today. I will send a restriction for nausea for postoperative control. We discussed the importance of the preoperative diet. All of her questions were asked and answered.  Current Plans Pt Education - EMW_preopbariatric Started Zofran ODT 4MG , 1 (one) Tablet Disperse every six hours, as needed, #30, 09/22/2014, No Refill. PREDIABETES (R73.09) Impression: per pcp  OSTEOARTHRITIS OF LUMBAR SPINE, UNSPECIFIED SPINAL OSTEOARTHRITIS COMPLICATION STATUS (D97.416) Impression: per pcp  GASTROESOPHAGEAL REFLUX DISEASE, ESOPHAGITIS PRESENCE NOT SPECIFIED  (K21.9) Impression: per pcp  OBSTRUCTIVE SLEEP APNEA, ADULT (G47.33) Impression: per pcp  ESSENTIAL HYPERTENSION WITH GOAL BLOOD PRESSURE LESS THAN 130/80 (I10) Impression: per pcp  HIATAL HERNIA (K44.9)  Leighton Ruff. Redmond Pulling, MD, FACS General, Bariatric, & Minimally Invasive Surgery [facility], [place]

## 2014-09-22 NOTE — Patient Instructions (Signed)
Taylor Silva  09/22/2014   Your procedure is scheduled on:  October 03, 2014  Report to [facility] Main  Entrance take [place]  elevators to 3rd floor to  [place] at  10:55 AM.  Call this number if you have problems the morning of surgery [phone]   Remember: ONLY 1 PERSON MAY GO WITH YOU TO SHORT STAY TO GET  READY MORNING OF Taylor Silva.  Do not eat food or drink liquids :After Midnight.     Take these medicines the morning of surgery with A SIP OF WATER: Wellbutrin, Zyrtec, Flonase, Gabapentin, Protonix                               You may not have any metal on your body including hair pins and              piercings  Do not wear jewelry, make-up, lotions, powders or perfumes, deodorant             Do not wear nail polish.  Do not shave  48 hours prior to surgery.                 Do not bring valuables to the hospital. Taylor Silva.  Contacts, dentures or bridgework may not be worn into surgery.  Leave suitcase in the car. After surgery it may be brought to your room.       Special Instructions: coughing and deep breathing exercises, leg exercises              Please read over the following fact sheets you were given: _____________________________________________________________________             [facility] - Preparing for Surgery Before surgery, you can play an important role.  Because skin is not sterile, your skin needs to be as free of germs as possible.  You can reduce the number of germs on your skin by washing with CHG (chlorahexidine gluconate) soap before surgery.  CHG is an antiseptic cleaner which kills germs and bonds with the skin to continue killing germs even after washing. Please DO NOT use if you have an allergy to CHG or antibacterial soaps.  If your skin becomes reddened/irritated stop using the CHG and inform your nurse when you arrive at Short Stay. Do not shave  (including legs and underarms) for at least 48 hours prior to the first CHG shower.  You may shave your face/neck. Please follow these instructions carefully:  1.  Shower with CHG Soap the night before surgery and the  morning of Surgery.  2.  If you choose to wash your hair, wash your hair first as usual with your  normal  shampoo.  3.  After you shampoo, rinse your hair and body thoroughly to remove the  shampoo.                           4.  Use CHG as you would any other liquid soap.  You can apply chg directly  to the skin and wash                       Gently with a  scrungie or clean washcloth.  5.  Apply the CHG Soap to your body ONLY FROM THE NECK DOWN.   Do not use on face/ open                           Wound or open sores. Avoid contact with eyes, ears mouth and genitals (private parts).                       Wash face,  Genitals (private parts) with your normal soap.             6.  Wash thoroughly, paying special attention to the area where your surgery  will be performed.  7.  Thoroughly rinse your body with warm water from the neck down.  8.  DO NOT shower/wash with your normal soap after using and rinsing off  the CHG Soap.                9.  Pat yourself dry with a clean towel.            10.  Wear clean pajamas.            11.  Place clean sheets on your bed the night of your first shower and do not  sleep with pets. Day of Surgery : Do not apply any lotions/deodorants the morning of surgery.  Please wear clean clothes to the hospital/surgery center.  FAILURE TO FOLLOW THESE INSTRUCTIONS MAY RESULT IN THE CANCELLATION OF YOUR SURGERY PATIENT SIGNATURE_________________________________  NURSE SIGNATURE__________________________________  ________________________________________________________________________

## 2014-09-29 ENCOUNTER — Encounter (HOSPITAL_COMMUNITY): Payer: Self-pay

## 2014-09-29 ENCOUNTER — Encounter (HOSPITAL_COMMUNITY)
Admission: RE | Admit: 2014-09-29 | Discharge: 2014-09-29 | Disposition: A | Discharge status: Home / Self Care | Payer: BLUE CROSS/BLUE SHIELD | Source: Ambulatory Visit | Attending: General Surgery | Admitting: General Surgery

## 2014-09-29 DIAGNOSIS — Z01818 Encounter for other preprocedural examination: Secondary | ICD-10-CM | POA: Diagnosis present

## 2014-09-29 HISTORY — DX: Mononeuropathy, unspecified: G58.9

## 2014-09-29 LAB — CBC WITH DIFFERENTIAL/PLATELET
BASOS PCT: 0 %
Basophils Absolute: 0 10*3/uL (ref 0.0–0.1)
EOS ABS: 0.2 10*3/uL (ref 0.0–0.7)
Eosinophils Relative: 2 %
HEMATOCRIT: 42.5 % (ref 36.0–46.0)
Hemoglobin: 13.4 g/dL (ref 12.0–15.0)
Lymphocytes Relative: 46 %
Lymphs Abs: 3.9 10*3/uL (ref 0.7–4.0)
MCH: 27.4 pg (ref 26.0–34.0)
MCHC: 31.5 g/dL (ref 30.0–36.0)
MCV: 86.9 fL (ref 78.0–100.0)
MONO ABS: 0.5 10*3/uL (ref 0.1–1.0)
MONOS PCT: 6 %
Neutro Abs: 3.9 10*3/uL (ref 1.7–7.7)
Neutrophils Relative %: 46 %
PLATELETS: 266 10*3/uL (ref 150–400)
RBC: 4.89 MIL/uL (ref 3.87–5.11)
RDW: 14.4 % (ref 11.5–15.5)
WBC: 8.5 10*3/uL (ref 4.0–10.5)

## 2014-09-29 LAB — COMPREHENSIVE METABOLIC PANEL
ALK PHOS: 117 U/L (ref 38–126)
ALT: 20 U/L (ref 14–54)
AST: 26 U/L (ref 15–41)
Albumin: 4.2 g/dL (ref 3.5–5.0)
Anion gap: 7 (ref 5–15)
BUN: 25 mg/dL — AB (ref 6–20)
CALCIUM: 9.6 mg/dL (ref 8.9–10.3)
CHLORIDE: 105 mmol/L (ref 101–111)
CO2: 29 mmol/L (ref 22–32)
CREATININE: 0.92 mg/dL (ref 0.44–1.00)
Glucose, Bld: 111 mg/dL — ABNORMAL HIGH (ref 65–99)
Potassium: 4.8 mmol/L (ref 3.5–5.1)
SODIUM: 141 mmol/L (ref 135–145)
Total Bilirubin: 0.2 mg/dL — ABNORMAL LOW (ref 0.3–1.2)
Total Protein: 8.1 g/dL (ref 6.5–8.1)

## 2014-09-29 NOTE — Progress Notes (Signed)
09-15-14 - sleep study - EPIC 06-22-14 - 2V CXR - EPIC 06-22-14 - UGI w/KUB - EPIC 05-31-14 - Stress Test & echo (with cardiac clearance) - EPIC 05-19-14 - EKG - EPIC 05-19-14 - LOV - Dr, Radford Pax (cardio) - EPIC 05-11-14 - HgbA1C - EPIC 04-05-14 - LOV - Dr. Larose Kells (fam.med.) - EPIC

## 2014-09-29 NOTE — Progress Notes (Signed)
09-29-14 - CMP lab results from preop-visit on 09-29-14 faxed to Dr. Greer Pickerel via [facility]

## 2014-10-02 MED ORDER — LEVOFLOXACIN IN D5W 750 MG/150ML IV SOLN
750.0000 mg | INTRAVENOUS | Status: AC
  Administered 2014-10-03: 750 mg via INTRAVENOUS
  Filled 2014-10-02: qty 150

## 2014-10-03 ENCOUNTER — Encounter (HOSPITAL_COMMUNITY)
Admission: RE | Disposition: A | Discharge status: Home / Self Care | Payer: Self-pay | Source: Ambulatory Visit | Attending: General Surgery

## 2014-10-03 ENCOUNTER — Encounter (HOSPITAL_COMMUNITY): Payer: Self-pay | Admitting: *Deleted

## 2014-10-03 ENCOUNTER — Inpatient Hospital Stay (HOSPITAL_COMMUNITY): Payer: BLUE CROSS/BLUE SHIELD | Admitting: Anesthesiology

## 2014-10-03 ENCOUNTER — Inpatient Hospital Stay (HOSPITAL_COMMUNITY)
Admission: RE | Admit: 2014-10-03 | Discharge: 2014-10-05 | DRG: 621 | Disposition: A | Discharge status: Home / Self Care | Payer: BLUE CROSS/BLUE SHIELD | Source: Ambulatory Visit | Attending: General Surgery | Admitting: General Surgery

## 2014-10-03 DIAGNOSIS — G4733 Obstructive sleep apnea (adult) (pediatric): Secondary | ICD-10-CM | POA: Diagnosis present

## 2014-10-03 DIAGNOSIS — Z833 Family history of diabetes mellitus: Secondary | ICD-10-CM

## 2014-10-03 DIAGNOSIS — K210 Gastro-esophageal reflux disease with esophagitis: Secondary | ICD-10-CM | POA: Diagnosis present

## 2014-10-03 DIAGNOSIS — K449 Diaphragmatic hernia without obstruction or gangrene: Secondary | ICD-10-CM | POA: Diagnosis present

## 2014-10-03 DIAGNOSIS — Z841 Family history of disorders of kidney and ureter: Secondary | ICD-10-CM | POA: Diagnosis not present

## 2014-10-03 DIAGNOSIS — G8929 Other chronic pain: Secondary | ICD-10-CM | POA: Diagnosis present

## 2014-10-03 DIAGNOSIS — Z880 Allergy status to penicillin: Secondary | ICD-10-CM | POA: Diagnosis not present

## 2014-10-03 DIAGNOSIS — R7309 Other abnormal glucose: Secondary | ICD-10-CM | POA: Diagnosis present

## 2014-10-03 DIAGNOSIS — Z01812 Encounter for preprocedural laboratory examination: Secondary | ICD-10-CM | POA: Diagnosis not present

## 2014-10-03 DIAGNOSIS — M1990 Unspecified osteoarthritis, unspecified site: Secondary | ICD-10-CM | POA: Diagnosis present

## 2014-10-03 DIAGNOSIS — I10 Essential (primary) hypertension: Secondary | ICD-10-CM | POA: Diagnosis present

## 2014-10-03 DIAGNOSIS — Z79891 Long term (current) use of opiate analgesic: Secondary | ICD-10-CM

## 2014-10-03 DIAGNOSIS — D179 Benign lipomatous neoplasm, unspecified: Secondary | ICD-10-CM | POA: Diagnosis present

## 2014-10-03 DIAGNOSIS — Z6841 Body Mass Index (BMI) 40.0 and over, adult: Secondary | ICD-10-CM

## 2014-10-03 DIAGNOSIS — K219 Gastro-esophageal reflux disease without esophagitis: Secondary | ICD-10-CM | POA: Diagnosis present

## 2014-10-03 DIAGNOSIS — Z809 Family history of malignant neoplasm, unspecified: Secondary | ICD-10-CM

## 2014-10-03 DIAGNOSIS — F329 Major depressive disorder, single episode, unspecified: Secondary | ICD-10-CM | POA: Diagnosis present

## 2014-10-03 DIAGNOSIS — Z7982 Long term (current) use of aspirin: Secondary | ICD-10-CM

## 2014-10-03 DIAGNOSIS — Z23 Encounter for immunization: Secondary | ICD-10-CM | POA: Diagnosis not present

## 2014-10-03 DIAGNOSIS — Z8249 Family history of ischemic heart disease and other diseases of the circulatory system: Secondary | ICD-10-CM

## 2014-10-03 DIAGNOSIS — J45909 Unspecified asthma, uncomplicated: Secondary | ICD-10-CM | POA: Diagnosis present

## 2014-10-03 DIAGNOSIS — R110 Nausea: Secondary | ICD-10-CM

## 2014-10-03 DIAGNOSIS — R739 Hyperglycemia, unspecified: Secondary | ICD-10-CM | POA: Diagnosis present

## 2014-10-03 DIAGNOSIS — Z881 Allergy status to other antibiotic agents status: Secondary | ICD-10-CM | POA: Diagnosis not present

## 2014-10-03 DIAGNOSIS — M549 Dorsalgia, unspecified: Secondary | ICD-10-CM | POA: Diagnosis present

## 2014-10-03 DIAGNOSIS — Z885 Allergy status to narcotic agent status: Secondary | ICD-10-CM | POA: Diagnosis not present

## 2014-10-03 DIAGNOSIS — M47816 Spondylosis without myelopathy or radiculopathy, lumbar region: Secondary | ICD-10-CM | POA: Diagnosis present

## 2014-10-03 DIAGNOSIS — Z9884 Bariatric surgery status: Secondary | ICD-10-CM

## 2014-10-03 DIAGNOSIS — Z882 Allergy status to sulfonamides status: Secondary | ICD-10-CM

## 2014-10-03 DIAGNOSIS — E66813 Obesity, class 3: Secondary | ICD-10-CM | POA: Diagnosis present

## 2014-10-03 HISTORY — PX: UPPER GI ENDOSCOPY: SHX6162

## 2014-10-03 HISTORY — PX: LAPAROSCOPIC GASTRIC SLEEVE RESECTION WITH HIATAL HERNIA REPAIR: SHX6512

## 2014-10-03 LAB — HEMOGLOBIN AND HEMATOCRIT, BLOOD
HEMATOCRIT: 42.1 % (ref 36.0–46.0)
HEMOGLOBIN: 13.8 g/dL (ref 12.0–15.0)

## 2014-10-03 LAB — GLUCOSE, CAPILLARY: GLUCOSE-CAPILLARY: 115 mg/dL — AB (ref 65–99)

## 2014-10-03 SURGERY — GASTRECTOMY, SLEEVE, LAPAROSCOPIC, WITH HIATAL HERNIA REPAIR
Anesthesia: General

## 2014-10-03 MED ORDER — PROMETHAZINE HCL 25 MG/ML IJ SOLN
6.2500 mg | INTRAMUSCULAR | Status: DC | PRN
  Administered 2014-10-03: 6.25 mg via INTRAVENOUS

## 2014-10-03 MED ORDER — UNJURY CHOCOLATE CLASSIC POWDER
2.0000 [oz_av] | Freq: Four times a day (QID) | ORAL | Status: DC
  Administered 2014-10-05: 2 [oz_av] via ORAL

## 2014-10-03 MED ORDER — ACETAMINOPHEN 160 MG/5ML PO SOLN
325.0000 mg | ORAL | Status: DC | PRN
Start: 2014-10-04 — End: 2014-10-03

## 2014-10-03 MED ORDER — LIDOCAINE HCL (CARDIAC) 20 MG/ML IV SOLN
INTRAVENOUS | Status: AC
  Filled 2014-10-03: qty 5

## 2014-10-03 MED ORDER — PNEUMOCOCCAL VAC POLYVALENT 25 MCG/0.5ML IJ INJ
0.5000 mL | INJECTION | INTRAMUSCULAR | Status: AC
  Administered 2014-10-05: 0.5 mL via INTRAMUSCULAR
  Filled 2014-10-03 (×2): qty 0.5

## 2014-10-03 MED ORDER — LACTATED RINGERS IR SOLN
Status: DC | PRN
  Administered 2014-10-03: 1000 mL

## 2014-10-03 MED ORDER — CHLORHEXIDINE GLUCONATE 0.12 % MT SOLN
15.0000 mL | Freq: Two times a day (BID) | OROMUCOSAL | Status: DC
  Administered 2014-10-03 – 2014-10-04 (×3): 15 mL via OROMUCOSAL
  Filled 2014-10-03 (×5): qty 15

## 2014-10-03 MED ORDER — SUCCINYLCHOLINE CHLORIDE 20 MG/ML IJ SOLN
INTRAMUSCULAR | Status: DC | PRN
  Administered 2014-10-03: 100 mg via INTRAVENOUS

## 2014-10-03 MED ORDER — ROCURONIUM BROMIDE 100 MG/10ML IV SOLN
INTRAVENOUS | Status: AC
  Filled 2014-10-03: qty 1

## 2014-10-03 MED ORDER — ONDANSETRON HCL 4 MG/2ML IJ SOLN
INTRAMUSCULAR | Status: AC
  Filled 2014-10-03: qty 2

## 2014-10-03 MED ORDER — KCL IN DEXTROSE-NACL 20-5-0.45 MEQ/L-%-% IV SOLN
INTRAVENOUS | Status: DC
Start: 2014-10-03 — End: 2014-10-05
  Administered 2014-10-03 – 2014-10-04 (×2): via INTRAVENOUS
  Administered 2014-10-05: 1000 mL via INTRAVENOUS
  Filled 2014-10-03 (×8): qty 1000

## 2014-10-03 MED ORDER — ACETAMINOPHEN 10 MG/ML IV SOLN
1000.0000 mg | Freq: Once | INTRAVENOUS | Status: AC
  Administered 2014-10-03: 1000 mg via INTRAVENOUS

## 2014-10-03 MED ORDER — PROMETHAZINE HCL 25 MG/ML IJ SOLN
INTRAMUSCULAR | Status: AC
  Filled 2014-10-03: qty 1

## 2014-10-03 MED ORDER — UNJURY CHICKEN SOUP POWDER: 2.0000 [oz_av] | Freq: Four times a day (QID) | ORAL | Status: DC

## 2014-10-03 MED ORDER — MIDAZOLAM HCL 2 MG/2ML IJ SOLN
INTRAMUSCULAR | Status: AC
  Filled 2014-10-03: qty 4

## 2014-10-03 MED ORDER — PROMETHAZINE HCL 25 MG/ML IJ SOLN: 12.5000 mg | Freq: Four times a day (QID) | INTRAMUSCULAR | Status: DC | PRN

## 2014-10-03 MED ORDER — ENOXAPARIN SODIUM 30 MG/0.3ML ~~LOC~~ SOLN
30.0000 mg | Freq: Two times a day (BID) | SUBCUTANEOUS | Status: DC
  Administered 2014-10-04 – 2014-10-05 (×3): 30 mg via SUBCUTANEOUS
  Filled 2014-10-03 (×5): qty 0.3

## 2014-10-03 MED ORDER — SODIUM CHLORIDE 0.9 % IJ SOLN
INTRAMUSCULAR | Status: DC | PRN
  Administered 2014-10-03: 50 mL

## 2014-10-03 MED ORDER — DEXAMETHASONE SODIUM PHOSPHATE 10 MG/ML IJ SOLN
INTRAMUSCULAR | Status: AC
Start: 2014-10-03 — End: 2014-10-03
  Filled 2014-10-03: qty 1

## 2014-10-03 MED ORDER — LIP MEDEX EX OINT
TOPICAL_OINTMENT | CUTANEOUS | Status: DC | PRN
  Filled 2014-10-03: qty 7

## 2014-10-03 MED ORDER — PROPOFOL 10 MG/ML IV BOLUS
INTRAVENOUS | Status: AC
  Filled 2014-10-03: qty 20

## 2014-10-03 MED ORDER — CHLORHEXIDINE GLUCONATE 4 % EX LIQD: 60.0000 mL | Freq: Once | CUTANEOUS | Status: DC

## 2014-10-03 MED ORDER — FENTANYL CITRATE (PF) 250 MCG/5ML IJ SOLN
INTRAMUSCULAR | Status: DC | PRN
  Administered 2014-10-03 (×3): 50 ug via INTRAVENOUS
  Administered 2014-10-03: 100 ug via INTRAVENOUS
  Administered 2014-10-03: 50 ug via INTRAVENOUS

## 2014-10-03 MED ORDER — HEPARIN SODIUM (PORCINE) 5000 UNIT/ML IJ SOLN
5000.0000 [IU] | INTRAMUSCULAR | Status: AC
  Administered 2014-10-03: 5000 [IU] via SUBCUTANEOUS
  Filled 2014-10-03: qty 1

## 2014-10-03 MED ORDER — LACTATED RINGERS IV SOLN
INTRAVENOUS | Status: DC
  Administered 2014-10-03: 1000 mL via INTRAVENOUS
  Administered 2014-10-03: 14:00:00 via INTRAVENOUS

## 2014-10-03 MED ORDER — ACETAMINOPHEN 10 MG/ML IV SOLN
1000.0000 mg | Freq: Four times a day (QID) | INTRAVENOUS | Status: AC
  Administered 2014-10-03 – 2014-10-04 (×4): 1000 mg via INTRAVENOUS
  Filled 2014-10-03 (×4): qty 100

## 2014-10-03 MED ORDER — PANTOPRAZOLE SODIUM 40 MG IV SOLR
40.0000 mg | Freq: Every day | INTRAVENOUS | Status: DC
  Administered 2014-10-03 – 2014-10-04 (×2): 40 mg via INTRAVENOUS
  Filled 2014-10-03 (×3): qty 40

## 2014-10-03 MED ORDER — SCOPOLAMINE 1 MG/3DAYS TD PT72
1.0000 | MEDICATED_PATCH | Freq: Once | TRANSDERMAL | Status: DC
  Administered 2014-10-03: 1.5 mg via TRANSDERMAL

## 2014-10-03 MED ORDER — SODIUM CHLORIDE 0.9 % IJ SOLN
INTRAMUSCULAR | Status: AC
  Filled 2014-10-03: qty 50

## 2014-10-03 MED ORDER — SCOPOLAMINE 1 MG/3DAYS TD PT72
MEDICATED_PATCH | TRANSDERMAL | Status: AC
  Filled 2014-10-03: qty 1

## 2014-10-03 MED ORDER — FENTANYL CITRATE (PF) 100 MCG/2ML IJ SOLN
INTRAMUSCULAR | Status: AC
  Filled 2014-10-03: qty 4

## 2014-10-03 MED ORDER — SUGAMMADEX SODIUM 500 MG/5ML IV SOLN
INTRAVENOUS | Status: AC
  Filled 2014-10-03: qty 5

## 2014-10-03 MED ORDER — ROCURONIUM BROMIDE 100 MG/10ML IV SOLN
INTRAVENOUS | Status: DC | PRN
  Administered 2014-10-03: 40 mg via INTRAVENOUS
  Administered 2014-10-03: 20 mg via INTRAVENOUS
  Administered 2014-10-03 (×2): 10 mg via INTRAVENOUS

## 2014-10-03 MED ORDER — FENTANYL CITRATE (PF) 100 MCG/2ML IJ SOLN
25.0000 ug | INTRAMUSCULAR | Status: DC | PRN
  Administered 2014-10-03 (×3): 50 ug via INTRAVENOUS

## 2014-10-03 MED ORDER — MIDAZOLAM HCL 5 MG/5ML IJ SOLN
INTRAMUSCULAR | Status: DC | PRN
  Administered 2014-10-03 (×2): 1 mg via INTRAVENOUS

## 2014-10-03 MED ORDER — FENTANYL CITRATE (PF) 100 MCG/2ML IJ SOLN
INTRAMUSCULAR | Status: AC
  Filled 2014-10-03: qty 2

## 2014-10-03 MED ORDER — FENTANYL CITRATE (PF) 100 MCG/2ML IJ SOLN
25.0000 ug | INTRAMUSCULAR | Status: DC | PRN
  Administered 2014-10-03: 50 ug via INTRAVENOUS
  Administered 2014-10-03: 25 ug via INTRAVENOUS
  Administered 2014-10-04 (×2): 75 ug via INTRAVENOUS
  Administered 2014-10-04: 50 ug via INTRAVENOUS
  Administered 2014-10-04: 75 ug via INTRAVENOUS
  Filled 2014-10-03 (×6): qty 2

## 2014-10-03 MED ORDER — CETYLPYRIDINIUM CHLORIDE 0.05 % MT LIQD: 7.0000 mL | Freq: Two times a day (BID) | OROMUCOSAL | Status: DC

## 2014-10-03 MED ORDER — GLYCOPYRROLATE 0.2 MG/ML IJ SOLN
INTRAMUSCULAR | Status: AC
  Filled 2014-10-03: qty 2

## 2014-10-03 MED ORDER — NEOSTIGMINE METHYLSULFATE 10 MG/10ML IV SOLN
INTRAVENOUS | Status: AC
  Filled 2014-10-03: qty 1

## 2014-10-03 MED ORDER — SUGAMMADEX SODIUM 200 MG/2ML IV SOLN
INTRAVENOUS | Status: DC | PRN
  Administered 2014-10-03: 500 mg via INTRAVENOUS

## 2014-10-03 MED ORDER — FENTANYL CITRATE (PF) 250 MCG/5ML IJ SOLN
INTRAMUSCULAR | Status: AC
  Filled 2014-10-03: qty 25

## 2014-10-03 MED ORDER — MEPERIDINE HCL 50 MG/ML IJ SOLN
6.2500 mg | INTRAMUSCULAR | Status: DC | PRN
Start: 2014-10-03 — End: 2014-10-03

## 2014-10-03 MED ORDER — METHOCARBAMOL 1000 MG/10ML IJ SOLN
1000.0000 mg | Freq: Three times a day (TID) | INTRAVENOUS | Status: DC
  Administered 2014-10-03 – 2014-10-05 (×5): 1000 mg via INTRAVENOUS
  Filled 2014-10-03 (×7): qty 10

## 2014-10-03 MED ORDER — ACETAMINOPHEN 160 MG/5ML PO SOLN: 650.0000 mg | ORAL | Status: DC | PRN

## 2014-10-03 MED ORDER — PROPOFOL 10 MG/ML IV BOLUS
INTRAVENOUS | Status: DC | PRN
  Administered 2014-10-03: 180 mg via INTRAVENOUS

## 2014-10-03 MED ORDER — UNJURY VANILLA POWDER: 2.0000 [oz_av] | Freq: Four times a day (QID) | ORAL | Status: DC

## 2014-10-03 MED ORDER — LIDOCAINE HCL (CARDIAC) 20 MG/ML IV SOLN
INTRAVENOUS | Status: DC | PRN
  Administered 2014-10-03: 80 mg via INTRAVENOUS

## 2014-10-03 MED ORDER — OXYCODONE HCL 5 MG/5ML PO SOLN: 5.0000 mg | ORAL | Status: DC | PRN

## 2014-10-03 MED ORDER — BUPIVACAINE LIPOSOME 1.3 % IJ SUSP
20.0000 mL | Freq: Once | INTRAMUSCULAR | Status: DC
  Filled 2014-10-03: qty 20

## 2014-10-03 MED ORDER — DEXAMETHASONE SODIUM PHOSPHATE 10 MG/ML IJ SOLN
INTRAMUSCULAR | Status: DC | PRN
  Administered 2014-10-03: 10 mg via INTRAVENOUS

## 2014-10-03 MED ORDER — LACTATED RINGERS IV SOLN: INTRAVENOUS | Status: DC

## 2014-10-03 MED ORDER — ACETAMINOPHEN 10 MG/ML IV SOLN
INTRAVENOUS | Status: AC
  Filled 2014-10-03: qty 100

## 2014-10-03 MED ORDER — INFLUENZA VAC SPLIT QUAD 0.5 ML IM SUSY
0.5000 mL | PREFILLED_SYRINGE | INTRAMUSCULAR | Status: AC
  Administered 2014-10-05: 0.5 mL via INTRAMUSCULAR
  Filled 2014-10-03 (×2): qty 0.5

## 2014-10-03 MED ORDER — ONDANSETRON HCL 4 MG/2ML IJ SOLN: 4.0000 mg | INTRAMUSCULAR | Status: DC | PRN

## 2014-10-03 MED ORDER — ACETAMINOPHEN 160 MG/5ML PO SOLN
650.0000 mg | ORAL | Status: DC | PRN
  Administered 2014-10-05: 650 mg via ORAL
  Filled 2014-10-03: qty 20.3

## 2014-10-03 MED ORDER — ONDANSETRON HCL 4 MG/2ML IJ SOLN
INTRAMUSCULAR | Status: DC | PRN
  Administered 2014-10-03: 4 mg via INTRAVENOUS

## 2014-10-03 MED ORDER — BUPIVACAINE LIPOSOME 1.3 % IJ SUSP
INTRAMUSCULAR | Status: DC | PRN
  Administered 2014-10-03: 20 mL

## 2014-10-03 MED ORDER — METOCLOPRAMIDE HCL 5 MG/ML IJ SOLN
10.0000 mg | Freq: Three times a day (TID) | INTRAMUSCULAR | Status: DC | PRN
  Administered 2014-10-03: 10 mg via INTRAVENOUS
  Filled 2014-10-03: qty 2

## 2014-10-03 SURGICAL SUPPLY — 69 items
ADH SKN CLS APL DERMABOND .7 (GAUZE/BANDAGES/DRESSINGS) ×1
APL SRG 32X5 SNPLK LF DISP (MISCELLANEOUS)
APPLICATOR COTTON TIP 6IN STRL (MISCELLANEOUS) IMPLANT
APPLIER CLIP ROT 10 11.4 M/L (STAPLE)
APR CLP MED LRG 11.4X10 (STAPLE)
BLADE SURG SZ11 CARB STEEL (BLADE) ×3 IMPLANT
CABLE HIGH FREQUENCY MONO STRZ (ELECTRODE) ×2 IMPLANT
CHLORAPREP W/TINT 26ML (MISCELLANEOUS) ×6 IMPLANT
CLIP APPLIE ROT 10 11.4 M/L (STAPLE) IMPLANT
COVER SURGICAL LIGHT HANDLE (MISCELLANEOUS) ×3 IMPLANT
DERMABOND ADVANCED (GAUZE/BANDAGES/DRESSINGS) ×2
DERMABOND ADVANCED .7 DNX12 (GAUZE/BANDAGES/DRESSINGS) ×1 IMPLANT
DEVICE SUT QUICK LOAD TK 5 (STAPLE) IMPLANT
DEVICE SUT TI-KNOT TK 5X26 (MISCELLANEOUS) ×4 IMPLANT
DEVICE SUTURE ENDOST 10MM (ENDOMECHANICALS) ×2 IMPLANT
DEVICE TI KNOT TK5 (MISCELLANEOUS) ×4
DEVICE TROCAR PUNCTURE CLOSURE (ENDOMECHANICALS) ×3 IMPLANT
DRAPE CAMERA CLOSED 9X96 (DRAPES) ×3 IMPLANT
DRAPE UTILITY XL STRL (DRAPES) ×6 IMPLANT
ELECT REM PT RETURN 9FT ADLT (ELECTROSURGICAL) ×3
ELECTRODE REM PT RTRN 9FT ADLT (ELECTROSURGICAL) ×1 IMPLANT
GAUZE SPONGE 4X4 12PLY STRL (GAUZE/BANDAGES/DRESSINGS) IMPLANT
GLOVE BIOGEL M STRL SZ7.5 (GLOVE) ×7 IMPLANT
GOWN STRL REUS W/TWL XL LVL3 (GOWN DISPOSABLE) ×9 IMPLANT
HOVERMATT SINGLE USE (MISCELLANEOUS) ×3 IMPLANT
KIT BASIN OR (CUSTOM PROCEDURE TRAY) ×3 IMPLANT
NDL SPNL 22GX3.5 QUINCKE BK (NEEDLE) ×1 IMPLANT
NEEDLE SPNL 22GX3.5 QUINCKE BK (NEEDLE) ×3 IMPLANT
PACK UNIVERSAL I (CUSTOM PROCEDURE TRAY) ×3 IMPLANT
PEN SKIN MARKING BROAD (MISCELLANEOUS) ×3 IMPLANT
QUICK LOAD TK 5 (STAPLE)
RELOAD STAPLE 60 3.6 BLU REG (STAPLE) IMPLANT
RELOAD STAPLE 60 3.8 GOLD REG (STAPLE) IMPLANT
RELOAD STAPLE 60 4.1 GRN THCK (STAPLE) IMPLANT
RELOAD STAPLER BLUE 60MM (STAPLE) ×3 IMPLANT
RELOAD STAPLER GOLD 60MM (STAPLE) ×1 IMPLANT
RELOAD STAPLER GREEN 60MM (STAPLE) ×2 IMPLANT
SCISSORS LAP 5X45 EPIX DISP (ENDOMECHANICALS) ×3 IMPLANT
SEALANT SURGICAL APPL DUAL CAN (MISCELLANEOUS) IMPLANT
SET IRRIG TUBING LAPAROSCOPIC (IRRIGATION / IRRIGATOR) ×3 IMPLANT
SHEARS CURVED HARMONIC AC 45CM (MISCELLANEOUS) ×3 IMPLANT
SLEEVE ADV FIXATION 5X100MM (TROCAR) ×6 IMPLANT
SLEEVE GASTRECTOMY 36FR VISIGI (MISCELLANEOUS) ×3 IMPLANT
SLEEVE XCEL OPT CAN 5 100 (ENDOMECHANICALS) IMPLANT
SOLUTION ANTI FOG 6CC (MISCELLANEOUS) ×3 IMPLANT
SPONGE LAP 18X18 X RAY DECT (DISPOSABLE) ×3 IMPLANT
STAPLER ECHELON BIOABSB 60 FLE (MISCELLANEOUS) ×12 IMPLANT
STAPLER ECHELON LONG 60 440 (INSTRUMENTS) ×2 IMPLANT
STAPLER RELOAD BLUE 60MM (STAPLE) ×9
STAPLER RELOAD GOLD 60MM (STAPLE) ×3
STAPLER RELOAD GREEN 60MM (STAPLE) ×6
SUT MNCRL AB 4-0 PS2 18 (SUTURE) ×3 IMPLANT
SUT SURGIDAC NAB ES-9 0 48 120 (SUTURE) ×10 IMPLANT
SUT VICRYL 0 TIES 12 18 (SUTURE) ×3 IMPLANT
SYR 10ML ECCENTRIC (SYRINGE) ×3 IMPLANT
SYR 20CC LL (SYRINGE) ×3 IMPLANT
SYR 50ML LL SCALE MARK (SYRINGE) ×3 IMPLANT
TOWEL OR 17X26 10 PK STRL BLUE (TOWEL DISPOSABLE) ×3 IMPLANT
TOWEL OR NON WOVEN STRL DISP B (DISPOSABLE) ×3 IMPLANT
TRAY FOLEY W/METER SILVER 14FR (SET/KITS/TRAYS/PACK) IMPLANT
TRAY FOLEY W/METER SILVER 16FR (SET/KITS/TRAYS/PACK) IMPLANT
TROCAR ADV FIXATION 5X100MM (TROCAR) ×3 IMPLANT
TROCAR BLADELESS 15MM (ENDOMECHANICALS) ×3 IMPLANT
TROCAR BLADELESS OPT 5 100 (ENDOMECHANICALS) ×3 IMPLANT
TROCAR XCEL 12X100 BLDLESS (ENDOMECHANICALS) ×2 IMPLANT
TUBING CONNECTING 10 (TUBING) ×3 IMPLANT
TUBING CONNECTING 10' (TUBING) ×2
TUBING ENDO SMARTCAP (MISCELLANEOUS) ×3 IMPLANT
TUBING FILTER THERMOFLATOR (ELECTROSURGICAL) ×3 IMPLANT

## 2014-10-03 NOTE — Anesthesia Postprocedure Evaluation (Signed)
  Anesthesia Post-op Note  Patient: Taylor Silva  Procedure(s) Performed: Procedure(s) (LRB): LAPAROSCOPIC GASTRIC SLEEVE RESECTION WITH HIATAL HERNIA REPAIR W UPPER ENDO (N/A) UPPER GI ENDOSCOPY (N/A)  Patient Location: PACU  Anesthesia Type: General  Level of Consciousness: awake and alert   Airway and Oxygen Therapy: Patient Spontanous Breathing  Post-op Pain: mild  Post-op Assessment: Post-op Vital signs reviewed, Patient's Cardiovascular Status Stable, Respiratory Function Stable, Patent Airway and No signs of Nausea or vomiting  Last Vitals:  Filed Vitals:   10/03/14 1515  BP: 151/73  Pulse: 87  Temp:   Resp: 13    Post-op Vital Signs: stable   Complications: No apparent anesthesia complications

## 2014-10-03 NOTE — H&P (View-Only) (Signed)
Taylor Silva 09/22/2014 11:52 AM Location: [facility] Patient #: [id number] DOB: [birth year] Married / Language: Cleophus Molt / Race: White Female History of Present Illness Randall Hiss M. Wilson MD; 09/22/2014 12:25 PM) The patient is a 59 year old female who presents for a preoperative evaluation. She comes in today for her preoperative visit. She has been approved for laparoscopic sleeve gastrectomy with possible hiatal hernia repair. Her initial visit weight was 312 pounds. She has been seen and evaluated by Dr. Radford Pax and cardiology. She denies any medical changes since her initial visit. She does state that she does have daily heartburn. She takes Protonix. She states occasionally she feels like something gets stuck in her throat. She does not regurgitate. Occasionally she will feel nauseous if she drinks something or eat something that is greasy. She denies any right upper quadrant or epigastric pain per se. Her upper GI did demonstrate a hiatal hernia there was no evidence of esophageal dysmotility, stricture, or narrowing or reflux on her upper GI. Her hemoglobin A1c was 6. Her LDL level was 119.  She denies any chest pain, chest pressure, shortness of breath, orthopnea. She does have obstructive sleep apnea and has tried numerous mask but is not able to tolerate any of them. Problem List/Past Medical Randall Hiss Ronnie Derby, MD; 09/22/2014 12:25 PM) OBSTRUCTIVE SLEEP APNEA, ADULT (G47.33) ESSENTIAL HYPERTENSION WITH GOAL BLOOD PRESSURE LESS THAN 130/80 (I10) GASTROESOPHAGEAL REFLUX DISEASE, ESOPHAGITIS PRESENCE NOT SPECIFIED (K21.9) PREDIABETES (R73.09) OSTEOARTHRITIS OF LUMBAR SPINE, UNSPECIFIED SPINAL OSTEOARTHRITIS COMPLICATION STATUS (N82.956) HIATAL HERNIA (K44.9) OBESITY, MORBID, BMI 50 OR HIGHER (E66.01)  Other Problems [name], MD; 09/22/2014 12:25 PM) Arthritis Asthma Back Pain Depression Gastroesophageal Reflux Disease Sleep Apnea  Past  Surgical History Elbert Ewings, CMA; 09/22/2014 11:52 AM) Foot Surgery Right. Hysterectomy (not due to cancer) - Partial Knee Surgery Right.  Diagnostic Studies History [name], MD; 09/22/2014 12:25 PM) Mammogram 1-3 years ago Pap Smear 1-5 years ago Colonoscopy 1-5 years ago  Allergies Elbert Ewings, CMA; 09/22/2014 11:53 AM) Codeine Phosphate *ANALGESICS - OPIOID* Anaphylaxis. Erythromycin-Sulfisoxazole *Anti-infective Agents - Misc.** Hives. Tetracycline *CHEMICALS* Hives. Penicillin G Pot in Dextrose *PENICILLINS* Sulfa 10 *OPHTHALMIC AGENTS* Nausea, Vomiting.  Medication History [name], MD; 09/22/2014 12:25 PM) Amitriptyline HCl (25MG  Tablet, Oral) Active. Aspirin-Acetaminophen-Caffeine (250-250-65MG  Tablet, Oral) Active. Wellbutrin (75MG  Tablet, Oral) Active. ZyrTEC Allergy (10MG  Capsule, Oral) Active. Tylenol Cold Relief Nighttime (25-500MG  Tablet, Oral) Active. Fish Oil Burp-Less (1000MG  Capsule, Oral) Active. Flonase (50MCG/ACT Suspension, Nasal) Active. Gabapentin (300MG  Capsule, Oral) Active. Microzide (12.5MG  Capsule, Oral) Active. Singulair (10MG  Tablet, Oral) Active. Multiple Vitamin (Oral) Active. Protonix (40MG  Tablet DR, Oral) Active. ROPINIRole HCl (0.5MG  Tablet, Oral) Active. TraMADol HCl (50MG  Tablet, Oral) Active. Vitamin D (Cholecalciferol) (1000UNIT Tablet, Oral) Active. Medications Reconciled Zofran ODT (4MG  Tablet Disperse, 1 (one) Tablet Disperse Oral every six hours, as needed, Taken starting 09/22/2014) Active.  Social History Elbert Ewings, [place]; 09/22/2014 11:52 AM) Alcohol use Occasional alcohol use. Caffeine use Carbonated beverages, Coffee, Tea. No drug use Tobacco use Never smoker.  Family History Elbert Ewings, [place]; 09/22/2014 11:52 AM) Arthritis Father, Mother, Sister. Cancer Father. Depression Son. Diabetes Mellitus Brother, Mother, Sister. Heart Disease Father. Hypertension Brother, Father,  Mother, Sister. Kidney Disease Mother. Thyroid problems Brother, Mother, Sister.  Pregnancy / Birth History Elbert Ewings, CMA; 09/22/2014 11:52 AM) Age at menarche 69 years. Age of menopause 59-60 Gravida 1 Maternal age 68-30 Para 1     Review of Systems [name], MD; 09/22/2014 12:25 00) General Present-  Fatigue, Night Sweats and Weight Gain. Not Present- Appetite Loss, Chills, Fever and Weight Loss. Skin Not Present- Change in Wart/Mole, Dryness, Hives, Jaundice, New Lesions, Non-Healing Wounds, Rash and Ulcer. HEENT Present- Earache, Hearing Loss, Ringing in the Ears, Seasonal Allergies, Sinus Pain and Wears glasses/contact lenses. Not Present- Hoarseness, Nose Bleed, Oral Ulcers, Sore Throat, Visual Disturbances and Yellow Eyes. Respiratory Present- Snoring and Wheezing. Not Present- Bloody sputum, Chronic Cough and Difficulty Breathing. Breast Not Present- Breast Mass, Breast Pain, Nipple Discharge and Skin Changes. Cardiovascular Not Present- Chest Pain, Difficulty Breathing Lying Down, Leg Cramps, Palpitations, Rapid Heart Rate, Shortness of Breath and Swelling of Extremities. Gastrointestinal Present- Constipation. Not Present- Abdominal Pain, Bloating, Bloody Stool, Change in Bowel Habits, Chronic diarrhea, Difficulty Swallowing, Excessive gas, Gets full quickly at meals, Hemorrhoids, Indigestion, Nausea, Rectal Pain and Vomiting. Female Genitourinary Not Present- Frequency, Nocturia, Painful Urination, Pelvic Pain and Urgency. Musculoskeletal Present- Back Pain, Joint Pain and Joint Stiffness. Not Present- Muscle Pain, Muscle Weakness and Swelling of Extremities. Neurological Not Present- Decreased Memory, Fainting, Headaches, Numbness, Seizures, Tingling, Tremor, Trouble walking and Weakness. Psychiatric Present- Depression. Not Present- Anxiety, Bipolar, Change in Sleep Pattern, Fearful and Frequent crying. Hematology Not Present- Easy Bruising, Excessive bleeding,  Gland problems, HIV and Persistent Infections.  Vitals Elbert Ewings CMA; 09/22/2014 11:53 AM) 09/22/2014 11:53 AM Weight: 308 lb Height: 65in Body Surface Area: 2.53 m Body Mass Index: 51.25 kg/m Temp.: 97.33F(Temporal)  Pulse: 119 (Regular)  BP: 130/72 (Sitting, Left Arm, Standard)     Physical Exam Randall Hiss M. Wilson MD; 09/22/2014 12:20 PM)  General Mental Status-Alert. General Appearance-Consistent with stated age. Hydration-Well hydrated. Voice-Normal. Note: morbidly obese   Head and Neck Head-normocephalic, atraumatic with no lesions or palpable masses. Trachea-midline. Thyroid Gland Characteristics - normal size and consistency.  Eye Eyeball - Bilateral-Extraocular movements intact. Sclera/Conjunctiva - Bilateral-No scleral icterus.  Chest and Lung Exam Chest and lung exam reveals -quiet, even and easy respiratory effort with no use of accessory muscles and on auscultation, normal breath sounds, no adventitious sounds and normal vocal resonance. Inspection Chest Wall - Normal. Back - normal.  Breast - Did not examine.  Cardiovascular Cardiovascular examination reveals -normal heart sounds, regular rate and rhythm with no murmurs and normal pedal pulses bilaterally.  Abdomen Inspection Inspection of the abdomen reveals - No Hernias. Skin - Scar - no surgical scars. Palpation/Percussion Palpation and Percussion of the abdomen reveal - Soft, Non Tender, No Rebound tenderness, No Rigidity (guarding) and No hepatosplenomegaly. Auscultation Auscultation of the abdomen reveals - Bowel sounds normal.  Peripheral Vascular Upper Extremity Palpation - Pulses bilaterally normal.  Neurologic Neurologic evaluation reveals -alert and oriented x 3 with no impairment of recent or remote memory. Mental Status-Normal.  Neuropsychiatric The patient's mood and affect are described as -normal. Judgment and Insight-insight is  appropriate concerning matters relevant to self.  Musculoskeletal Normal Exam - Left-Upper Extremity Strength Normal and Lower Extremity Strength Normal. Normal Exam - Right-Upper Extremity Strength Normal and Lower Extremity Strength Normal. Note: bilateral knee crepitus   Lymphatic Head & Neck  General Head & Neck Lymphatics: Bilateral - Description - Normal. Axillary - Did not examine. Femoral & Inguinal - Did not examine.    Assessment & Plan Randall Hiss M. Wilson MD; 09/22/2014 12:25 PM)  OBESITY, MORBID, BMI 50 OR HIGHER (E66.01) Impression: We discussed her preoperative workup. We discussed the findings of a hiatal hernia. Since she does have to take reflux medication I advised her that we would test for the hiatal  hernia during surgery and if found to have a clinically significant one I recommended repair during surgery. We discussed what repair with involve along with the risk and benefits. I'm not sure what to make of her complaint of feeling like something occasionally gets stuck. Her upper GI showed no evidence of stricture, narrowing, dysmotility or any significant reflux. I think we can proceed with a sleeve gastrectomy. It is perhaps her hiatal hernia which is causing some of the symptoms. Because of her issues with narcotics in the past with sensitivity we will not give her her postoperative pain medicine prescription today. I will send a restriction for nausea for postoperative control. We discussed the importance of the preoperative diet. All of her questions were asked and answered.  Current Plans Pt Education - EMW_preopbariatric Started Zofran ODT 4MG , 1 (one) Tablet Disperse every six hours, as needed, #30, 09/22/2014, No Refill. PREDIABETES (R73.09) Impression: per pcp  OSTEOARTHRITIS OF LUMBAR SPINE, UNSPECIFIED SPINAL OSTEOARTHRITIS COMPLICATION STATUS (G86.761) Impression: per pcp  GASTROESOPHAGEAL REFLUX DISEASE, ESOPHAGITIS PRESENCE NOT SPECIFIED  (K21.9) Impression: per pcp  OBSTRUCTIVE SLEEP APNEA, ADULT (G47.33) Impression: per pcp  ESSENTIAL HYPERTENSION WITH GOAL BLOOD PRESSURE LESS THAN 130/80 (I10) Impression: per pcp  HIATAL HERNIA (K44.9)  Leighton Ruff. Redmond Pulling, MD, FACS General, Bariatric, & Minimally Invasive Surgery [facility], [place]

## 2014-10-03 NOTE — Op Note (Signed)
DEASIA CHIU [id number] 14-Apr-1955 10/03/2014  Preoperative diagnosis: morbid obesity and GERD  Postoperative diagnosis: Same   Procedure: Upper endoscopy   Surgeon: Catalina Antigua B. Hassell Done  M.D., FACS   Anesthesia: Gen.   Indications for procedure: This patient was undergoing a sleeve gastrectomy and hiatus hernia repair.    Description of procedure: The endoscopy was placed in the mouth and into the oropharynx and under endoscopic vision it was advanced to the esophagogastric junction.  The pouch was insufflated and the sleeve appeared as a uniform cylindrical tube that ran to the pylorus.   No bleeding or leaks were detected.  The scope was withdrawn without difficulty.     Matt B. Hassell Done, MD, FACS General, Bariatric, & Minimally Invasive Surgery [facility], [place]

## 2014-10-03 NOTE — Anesthesia Preprocedure Evaluation (Signed)
Anesthesia Evaluation  Patient identified by MRN, date of birth, ID band Patient awake    Reviewed: Allergy & Precautions, NPO status , Patient's Chart, lab work & pertinent test results  History of Anesthesia Complications (+) PONV  Airway Mallampati: II  TM Distance: >3 FB Neck ROM: Full    Dental no notable dental hx.    Pulmonary asthma , sleep apnea ,    Pulmonary exam normal breath sounds clear to auscultation       Cardiovascular hypertension, Pt. on medications Normal cardiovascular exam+ Valvular Problems/Murmurs MVP  Rhythm:Regular Rate:Normal     Neuro/Psych negative neurological ROS  negative psych ROS   GI/Hepatic Neg liver ROS, hiatal hernia,   Endo/Other  Morbid obesity  Renal/GU negative Renal ROS  negative genitourinary   Musculoskeletal negative musculoskeletal ROS (+)   Abdominal   Peds negative pediatric ROS (+)  Hematology negative hematology ROS (+)   Anesthesia Other Findings   Reproductive/Obstetrics negative OB ROS                             Anesthesia Physical Anesthesia Plan  ASA: III  Anesthesia Plan: General   Post-op Pain Management:    Induction: Intravenous  Airway Management Planned: Oral ETT  Additional Equipment:   Intra-op Plan:   Post-operative Plan: Extubation in OR  Informed Consent: I have reviewed the patients History and Physical, chart, labs and discussed the procedure including the risks, benefits and alternatives for the proposed anesthesia with the patient or authorized representative who has indicated his/her understanding and acceptance.   Dental advisory given  Plan Discussed with: CRNA  Anesthesia Plan Comments:         Anesthesia Quick Evaluation

## 2014-10-03 NOTE — Interval H&P Note (Signed)
History and Physical Interval Note:  10/03/2014 12:25 PM  Taylor Silva  has presented today for surgery, with the diagnosis of MORBID OBESITY  The various methods of treatment have been discussed with the patient and family. After consideration of risks, benefits and other options for treatment, the patient has consented to  Procedure(s): LAPAROSCOPIC GASTRIC SLEEVE RESECTION WITH HIATAL HERNIA REPAIR W UPPER ENDO (N/A) as a surgical intervention .  The patient's history has been reviewed, patient examined, no change in status, stable for surgery.  I have reviewed the patient's chart and labs.  Questions were answered to the patient's satisfaction.    Leighton Ruff. Redmond Pulling, MD, FACS General, Bariatric, & Minimally Invasive Surgery [facility] [place] g  [name]

## 2014-10-03 NOTE — Anesthesia Procedure Notes (Signed)
Procedure Name: Intubation Date/Time: 10/03/2014 12:36 PM Performed by: Dione Booze Pre-anesthesia Checklist: Patient identified, Emergency Drugs available, Suction available and Patient being monitored Patient Re-evaluated:Patient Re-evaluated prior to inductionOxygen Delivery Method: Circle system utilized Preoxygenation: Pre-oxygenation with 100% oxygen Intubation Type: IV induction Laryngoscope Size: Mac and 4 Grade View: Grade I Tube type: Oral Tube size: 7.5 mm Number of attempts: 1 Airway Equipment and Method: Stylet Placement Confirmation: ETT inserted through vocal cords under direct vision Secured at: 20 cm Tube secured with: Tape Dental Injury: Teeth and Oropharynx as per pre-operative assessment

## 2014-10-03 NOTE — Op Note (Signed)
10/03/2014 Taylor Silva 1955/11/14 [id number]   PRE-OPERATIVE DIAGNOSIS:     Obesity, Class III, BMI 49   OSA (obstructive sleep apnea)   Back pain   Prediabetes   Hypertension   GERD (gastroesophageal reflux disease)   Hiatal hernia  POST-OPERATIVE DIAGNOSIS:  same  PROCEDURE:  Procedure(s): LAPAROSCOPIC SLEEVE GASTRECTOMY WITH HIATAL HERNIA REPAIR UPPER GI ENDOSCOPY  SURGEON:  Surgeon(s): [name], MD FACS FASMBS  ASSISTANTS: Johnathan Hausen MD FACS   ANESTHESIA:   general  DRAINS: none   BOUGIE: 80 fr ViSiGi  LOCAL MEDICATIONS USED:  MARCAINE + Exparel  SPECIMEN:  Source of Specimen:  Greater curvature of stomach  DISPOSITION OF SPECIMEN:  PATHOLOGY  COUNTS:  YES  INDICATION FOR PROCEDURE: This is a very pleasant 59 year old morbidly obese female who has had unsuccessful attempts for sustained weight loss. She presents today for a planned laparoscopic sleeve gastrectomy with possible hiatal hernia repair and upper endoscopy. We have discussed the risk and benefits of the procedure extensively preoperatively. Please see my separate notes.  PROCEDURE: After obtaining informed consent and receiving 5000 units of subcutaneous heparin, the patient was brought to the operating room at [facility] and placed supine on the operating room table. General endotracheal anesthesia was established. Sequential compression devices were placed. A orogastric tube was placed. The patient's abdomen was prepped and draped in the usual standard surgical fashion. She received preoperative IV antibiotics. A surgical timeout was performed.  Access to the abdomen was achieved using a 5 mm 0 laparoscope thru a 5 mm trocar In the left upper Quadrant 2 fingerbreadths below the left subcostal margin using the Optiview technique. Pneumoperitoneum was smoothly established up to 15 mm of mercury. The laparoscope was advanced and the abdominal cavity was surveilled. The patient was then  placed in reverse Trendelenburg. There was evidence of a hiatal hernia on laparoscopy - decent gap in the left and right crus anteriorly with some of stomach above crura.  A 5 mm trocar was placed slightly above and to the left of the umbilicus under direct visualization.  The [facility] liver retractor was placed under the left lobe of the liver through a 5 mm trocar incision site in the subxiphoid position. A 5 mm trocar was placed in the lateral right upper quadrant along with a 15 mm trocar in the mid right abdomen. A final 5 mm trocar was placed in the lateral LUQ.  All under direct visualization after local had been infiltrated.  The stomach was inspected. It was completely decompressed and the orogastric tube was removed.  There was a gap anteriorly between the crura that was obviously visible. The calibration tube was placed in the oropharynx and guided down into the stomach by the CRNA. 10 mL of air was insufflated into the calibration balloon. The calibration tubing was then gently pulled back by the CRNA and it slid past the GE junction. At this point the calibration tubing was desufflated and pulled back into the esophagus. This confirmed my suspicion of a clinically significant hiatal hernia. The gastrohepatic ligament was incised with harmonic scalpel. The right crus was identified. We identified the crossing fat along the right crus. The adipose tissue just above this area was incised with harmonic scalpel. I then bluntly dissected out this area and identified the left crus. There was evidence of a hiatal hernia. I then mobilized the esophagus.The stomach was reduced into the abdominal cavity. A lipoma around the hiatus was reduced.  The left and  right crus were further mobilized with blunt dissection. The anterior and posterior vagi were identified and preserved.  I was then able to reapproximate the left and right crus with 0 Ethibond using an Endostitch suture device and securing it with a  titanium tyknot. I placed 3 additional sutures in a similar fashion. We then had the CRNA readvanced the calibration tubing back into the stomach. 10 mL of air was insufflated into the calibration tube balloon. The calibration tube was then gently pulled back and there was resistance at the GE junction. The tube did not slide back up into the esophagus. At this point the calibration tubing was deflated and removed from the patient's body.   We identified the pylorus and measured 6 cm proximal to the pylorus and identified an area of where we would start taking down the short gastric vessels. Harmonic scalpel was used to take down the short gastric vessels along the greater curvature of the stomach. We were able to enter the lesser sac. We continued to march along the greater curvature of the stomach taking down the short gastrics. As we approached the gastrosplenic ligament we took care in this area not to injure the spleen. We were able to take down the entire gastrosplenic ligament. We then mobilized the fundus away from the left crus of diaphragm. There were not any significant posterior gastric avascular attachments. This left the stomach completely mobilized. No vessels had been taken down along the lesser curvature of the stomach.  We then reidentified the pylorus. A 36Fr ViSiGi was then placed in the oropharynx and advanced down into the stomach and placed in the distal antrum and positioned along the lesser curvature. It was placed under suction which secured the 36Fr ViSiGi in place along the lesser curve. Then using the Ethicon echelon 60 mm stapler with a green load with Seamguard, I placed a stapler along the antrum approximately 5 cm from the pylorus. The stapler was angled so that there is ample room at the angularis incisura. I then fired the first staple load after inspecting it posteriorly to ensure adequate space both anteriorly and posteriorly. At this point I still was not completely past  the angularis so with another green load with Seamguard, I placed the stapler in position just inside the prior stapleline. We then rotated the stomach to insure that there was adequate anteriorly as well as posteriorly. The stapler was then fired. At this point the stapler was lined up appropriately with the planned trajectory of my staple line even with articulating it. I therefore upsized the mid LUQ 30mm trocar to a 21mm trocar and started stapling thru this trocar. At this point I started using 60 mm gold load staple cartridge with Seamguard. The echelon stapler was then repositioned with a 60 mm gold load with Seamguard and we continued to march up along the Dupuyer. My assistant was holding traction along the greater curvature stomach along the cauterized short gastric vessels ensuring that the stomach was symmetrically retracted. Prior to each firing of the staple, we rotated the stomach to ensure that there is adequate stomach left.  As we approached the fundus, I used 60 mm blue cartridge with Seamguard aiming slightly lateral to the esophageal fat pad.Although the staples on this fire had completely gone thru the last part of the stomach it had not completely cut it. Therefore 1 additional 60 blue load was used to free the remaining stomach. The sleeve was inspected. There is no evidence of cork  screw. The staple line appeared hemostatic. The CRNA inflated the ViSiGi to the green zone and the upper abdomen was flooded with saline. There were no bubbles. The sleeve was decompressed and the ViSiGi removed. My assistant scrubbed out and performed an upper endoscopy. The sleeve easily distended with air and the scope was easily advanced to the pylorus. There is no evidence of internal bleeding or cork screwing. There was no narrowing at the angularis. There is no evidence of bubbles. Please see his operative note for further details. The gastric sleeve was decompressed and the endoscope was removed.  The  greater curvature the stomach was grasped with a laparoscopic grasper and removed from the 15 mm trocar site.  The liver retractor was removed. I then closed the 15 mm trocar site with 2 interrupted 0 Vicryl sutures through the fascia using the endoclose. The closure was viewed laparoscopically and it was airtight. 70 cc of Exparel was then infiltrated in the preperitoneal spaces around the trocar sites. Pneumoperitoneum was released. All trocar sites were closed with a 4-0 Monocryl in a subcuticular fashion followed by the application of Dermabond. The patient was extubated and taken to the recovery room in stable condition. All needle, instrument, and sponge counts were correct x2. There are no immediate complications  (2) 60 mm green with Seamguard (1) 60 mm gold with seamguard (3) 60 mm blue with 2 seamguard  PLAN OF CARE: Admit to inpatient   PATIENT DISPOSITION:  PACU - hemodynamically stable.   Delay start of Pharmacological VTE agent (>24hrs) due to surgical blood loss or risk of bleeding:  no  Leighton Ruff. Redmond Pulling, MD, FACS FASMBS General, Bariatric, & Minimally Invasive Surgery [facility], [place]

## 2014-10-03 NOTE — Transfer of Care (Signed)
Immediate Anesthesia Transfer of Care Note  Patient: Taylor Silva  Procedure(s) Performed: Procedure(s): LAPAROSCOPIC GASTRIC SLEEVE RESECTION WITH HIATAL HERNIA REPAIR W UPPER ENDO (N/A) UPPER GI ENDOSCOPY (N/A)  Patient Location: PACU  Anesthesia Type:General  Level of Consciousness:  sedated, patient cooperative and responds to stimulation  Airway & Oxygen Therapy:Patient Spontanous Breathing and Patient connected to face mask oxgen  Post-op Assessment:  Report given to PACU RN and Post -op Vital signs reviewed and stable  Post vital signs:  Reviewed and stable  Last Vitals:  Filed Vitals:   10/03/14 1043  BP: 131/68  Pulse: 98  Temp: 36.5 C  Resp: 18    Complications: No apparent anesthesia complications

## 2014-10-04 ENCOUNTER — Inpatient Hospital Stay (HOSPITAL_COMMUNITY): Payer: BLUE CROSS/BLUE SHIELD

## 2014-10-04 LAB — CBC WITH DIFFERENTIAL/PLATELET
Basophils Absolute: 0 K/uL (ref 0.0–0.1)
Basophils Relative: 0 %
Eosinophils Absolute: 0 K/uL (ref 0.0–0.7)
Eosinophils Relative: 0 %
HCT: 38.3 % (ref 36.0–46.0)
Hemoglobin: 12.4 g/dL (ref 12.0–15.0)
Lymphocytes Relative: 28 %
Lymphs Abs: 2.7 K/uL (ref 0.7–4.0)
MCH: 27.2 pg (ref 26.0–34.0)
MCHC: 32.4 g/dL (ref 30.0–36.0)
MCV: 84 fL (ref 78.0–100.0)
Monocytes Absolute: 0.7 K/uL (ref 0.1–1.0)
Monocytes Relative: 7 %
Neutro Abs: 6.2 K/uL (ref 1.7–7.7)
Neutrophils Relative %: 65 %
Platelets: 238 K/uL (ref 150–400)
RBC: 4.56 MIL/uL (ref 3.87–5.11)
RDW: 14.2 % (ref 11.5–15.5)
WBC: 9.6 K/uL (ref 4.0–10.5)

## 2014-10-04 LAB — COMPREHENSIVE METABOLIC PANEL WITH GFR
ALT: 28 U/L (ref 14–54)
AST: 49 U/L — ABNORMAL HIGH (ref 15–41)
Albumin: 3.7 g/dL (ref 3.5–5.0)
Alkaline Phosphatase: 94 U/L (ref 38–126)
Anion gap: 7 (ref 5–15)
BUN: 12 mg/dL (ref 6–20)
CO2: 26 mmol/L (ref 22–32)
Calcium: 8.9 mg/dL (ref 8.9–10.3)
Chloride: 106 mmol/L (ref 101–111)
Creatinine, Ser: 0.82 mg/dL (ref 0.44–1.00)
GFR calc Af Amer: >60 mL/min
GFR calc non Af Amer: >60 mL/min
Glucose, Bld: 141 mg/dL — ABNORMAL HIGH (ref 65–99)
Potassium: 3.8 mmol/L (ref 3.5–5.1)
Sodium: 139 mmol/L (ref 135–145)
Total Bilirubin: 0.5 mg/dL (ref 0.3–1.2)
Total Protein: 7.2 g/dL (ref 6.5–8.1)

## 2014-10-04 LAB — HEMOGLOBIN AND HEMATOCRIT, BLOOD
HEMATOCRIT: 37.4 % (ref 36.0–46.0)
Hemoglobin: 12.4 g/dL (ref 12.0–15.0)

## 2014-10-04 MED ORDER — IOHEXOL 300 MG/ML  SOLN: 50.0000 mL | Freq: Once | INTRAMUSCULAR | Status: DC | PRN

## 2014-10-04 MED ORDER — HYDROCODONE-ACETAMINOPHEN 7.5-325 MG/15ML PO SOLN
10.0000 mL | ORAL | Status: DC | PRN
  Administered 2014-10-04 – 2014-10-05 (×2): 10 mL via ORAL
  Filled 2014-10-04 (×2): qty 15

## 2014-10-04 NOTE — Progress Notes (Signed)
1 Day Post-Op  Subjective: Didn't sleep well. Couldn't get comfortable due to chronic back pain. Some nausea but not bad. Pain ok. Ambulated last night.   Objective: Vital signs in last 24 hours: Temp:  [97.7 F (36.5 C)-99.9 F (37.7 C)] 99.3 F (37.4 C) (09/28 0510) Pulse Rate:  [75-98] 89 (09/28 0510) Resp:  [13-18] 18 (09/28 0510) BP: (111-161)/(52-116) 133/52 mmHg (09/28 0510) SpO2:  [94 %-100 %] 96 % (09/28 0510) Weight:  [136.079 kg (300 lb)] 136.079 kg (300 lb) (09/27 1043) Last BM Date: 10/01/14  Intake/Output from previous day: 09/27 0701 - 09/28 0700 In: 2939.6 [I.V.:2839.6; IV Piggyback:100] Out: 2500 [Urine:2450; Blood:50] Intake/Output this shift:   Alert, nontoxic Some upperairway cough but cta b/l Reg Soft, obese, approp TTP, incisions c/d/i +SCDs  Lab Results:   Recent Labs  10/03/14 1528 10/04/14 0510  WBC  --  9.6  HGB 13.8 12.4  HCT 42.1 38.3  PLT  --  238   BMET  Recent Labs  10/04/14 0510  NA 139  K 3.8  CL 106  CO2 26  GLUCOSE 141*  BUN 12  CREATININE 0.82  CALCIUM 8.9   PT/INR No results for input(s): LABPROT, INR in the last 72 hours. ABG No results for input(s): PHART, HCO3 in the last 72 hours.  Invalid input(s): PCO2, PO2  Studies/Results: No results found.  Anti-infectives: Anti-infectives    Start     Dose/Rate Route Frequency Ordered Stop   10/03/14 0600  levofloxacin (LEVAQUIN) IVPB 750 mg     750 mg 100 mL/hr over 90 Minutes Intravenous On call to O.R. 10/02/14 1344 10/03/14 1241      Assessment/Plan: s/p Procedure(s): LAPAROSCOPIC GASTRIC SLEEVE RESECTION WITH HIATAL HERNIA REPAIR W UPPER ENDO (N/A) UPPER GI ENDOSCOPY (N/A)  no fever, no tachycardia. Given HHR, will get UGI this am, if ok start pod 1 diet Ambulate, pulm toilet vte prophylaxis antiemetics  Leighton Ruff. Redmond Pulling, MD, FACS General, Bariatric, & Minimally Invasive Surgery [facility], [place]   LOS: 1 day    [name] 10/04/2014

## 2014-10-04 NOTE — Progress Notes (Signed)
Patient alert and oriented, Post op day 1.  Provided support and encouragement.  Encouraged pulmonary toilet, ambulation and small sips of liquids.  All questions answered.  Will continue to monitor. 

## 2014-10-04 NOTE — Plan of Care (Signed)
Problem: Food- and Nutrition-Related Knowledge Deficit (NB-1.1) Goal: Nutrition education Formal process to instruct or train a patient/client in a skill or to impart knowledge to help patients/clients voluntarily manage or modify food choices and eating behavior to maintain or improve health. Outcome: Completed/Met Date Met:  10/04/14 Nutrition Education Note  Received consult for diet education per DROP protocol.   Discussed 2 week post op diet with pt. Emphasized that liquids must be non carbonated, non caffeinated, and sugar free. Fluid goals discussed. Pt to follow up with outpatient bariatric RD for further diet progression after 2 weeks. Multivitamins and minerals also reviewed. Teach back method used, pt expressed understanding, expect good compliance.   Diet: First 2 Weeks  You will see the nutritionist about two (2) weeks after your surgery. The nutritionist will increase the types of foods you can eat if you are handling liquids well:  If you have severe vomiting or nausea and cannot handle clear liquids lasting longer than 1 day, call your surgeon  Protein Shake  Drink at least 2 ounces of shake 5-6 times per day  Each serving of protein shakes (usually 8 - 12 ounces) should have a minimum of:  15 grams of protein  And no more than 5 grams of carbohydrate  Goal for protein each day:  Men = 80 grams per day  Women = 60 grams per day  Protein powder may be added to fluids such as non-fat milk or Lactaid milk or Soy milk (limit to 35 grams added protein powder per serving)   Hydration  Slowly increase the amount of water and other clear liquids as tolerated (See Acceptable Fluids)  Slowly increase the amount of protein shake as tolerated  Sip fluids slowly and throughout the day  May use sugar substitutes in small amounts (no more than 6 - 8 packets per day; i.e. Splenda)   Fluid Goal  The first goal is to drink at least 8 ounces of protein shake/drink per day (or as directed  by the nutritionist); some examples of protein shakes are Syntrax Nectar, Adkins Advantage, EAS Edge HP, and Unjury. See handout from pre-op Bariatric Education Class:  Slowly increase the amount of protein shake you drink as tolerated  You may find it easier to slowly sip shakes throughout the day  It is important to get your proteins in first  Your fluid goal is to drink 64 - 100 ounces of fluid daily  It may take a few weeks to build up to this  32 oz (or more) should be clear liquids  And  32 oz (or more) should be full liquids (see below for examples)  Liquids should not contain sugar, caffeine, or carbonation   Clear Liquids:  Water or Sugar-free flavored water (i.e. Fruit H2O, Propel)  Decaffeinated coffee or tea (sugar-free)  Crystal Lite, Wyler's Lite, Minute Maid Lite  Sugar-free Jell-O  Bouillon or broth  Sugar-free Popsicle: *Less than 20 calories each; Limit 1 per day   Full Liquids:  Protein Shakes/Drinks + 2 choices per day of other full liquids  Full liquids must be:  No More Than 12 grams of Carbs per serving  No More Than 3 grams of Fat per serving  Strained low-fat cream soup  Non-Fat milk  Fat-free Lactaid Milk  Sugar-free yogurt (Dannon Lite & Fit, Greek yogurt)     Lindsey Baker, MS, RD, LDN Pager: [phone] After Hours Pager: [phone]        

## 2014-10-04 NOTE — Care Management Note (Signed)
Case Management Note  Patient Details  Name: JAPJI KOK MRN: [id number] Date of Birth: 14-Apr-1955  Subjective/Objective:       planned laparoscopic sleeve gastrectomy with hiatal hernia repair              Action/Plan: Discharge planning  Expected Discharge Date:                  Expected Discharge Plan:  Home/Self Care  In-House Referral:  NA  Discharge planning Services  CM Consult  Post Acute Care Choice:    Choice offered to:  NA  DME Arranged:  N/A DME Agency:  NA  HH Arranged:  NA HH Agency:  NA  Status of Service:  Completed, signed off  Medicare Important Message Given:    Date Medicare IM Given:    Medicare IM give by:    Date Additional Medicare IM Given:    Additional Medicare Important Message give by:     If discussed at [place] of Stay Meetings, dates discussed:    Additional Comments:  Guadalupe Maple, RN 10/04/2014, 10:09 AM

## 2014-10-05 LAB — CBC WITH DIFFERENTIAL/PLATELET
Basophils Absolute: 0 10*3/uL (ref 0.0–0.1)
Basophils Relative: 0 %
EOS PCT: 1 %
Eosinophils Absolute: 0.1 10*3/uL (ref 0.0–0.7)
HEMATOCRIT: 39 % (ref 36.0–46.0)
Hemoglobin: 12.5 g/dL (ref 12.0–15.0)
LYMPHS ABS: 3.8 10*3/uL (ref 0.7–4.0)
LYMPHS PCT: 43 %
MCH: 27.4 pg (ref 26.0–34.0)
MCHC: 32.1 g/dL (ref 30.0–36.0)
MCV: 85.5 fL (ref 78.0–100.0)
MONO ABS: 0.6 10*3/uL (ref 0.1–1.0)
Monocytes Relative: 7 %
NEUTROS ABS: 4.4 10*3/uL (ref 1.7–7.7)
Neutrophils Relative %: 49 %
PLATELETS: 223 10*3/uL (ref 150–400)
RBC: 4.56 MIL/uL (ref 3.87–5.11)
RDW: 14.5 % (ref 11.5–15.5)
WBC: 8.8 10*3/uL (ref 4.0–10.5)

## 2014-10-05 MED ORDER — PREMIER PROTEIN SHAKE
2.0000 [oz_av] | Freq: Four times a day (QID) | ORAL | Status: DC
  Administered 2014-10-05 (×2): 2 [oz_av] via ORAL
  Filled 2014-10-05: qty 325.31

## 2014-10-05 MED ORDER — HYDROCODONE-ACETAMINOPHEN 7.5-325 MG/15ML PO SOLN: 10.0000 mL | ORAL | Status: DC | PRN

## 2014-10-05 NOTE — Progress Notes (Signed)
Patient alert and oriented, pain is controlled. Patient is tolerating fluids,  advanced to protein shake today, patient tolerated well.  Reviewed Gastric sleeve discharge instructions with patient and patient is able to articulate understanding.  Provided information on BELT program, Support Group and [facility] outpatient pharmacy. All questions answered, will continue to monitor.  

## 2014-10-05 NOTE — Discharge Summary (Signed)
Physician Discharge Summary  Taylor Silva DBZ:[id number] DOB: [birth year] DOA: 10/03/2014  PCP: Kathlene November, MD  Admit date: 10/03/2014 Discharge date: 10/05/2014  Recommendations for Outpatient Follow-up:   Follow-up Information    Follow up with [name], MD. Go on 10/18/2014.   Specialty:  General Surgery   Why:  For Post-Op Check at 11:15   Contact information:   [place] [address] [phone]      Discharge Diagnoses:  Principal Problem:   Obesity, Class III, BMI 40-49.9 (morbid obesity) Active Problems:   OSA (obstructive sleep apnea)   Back pain   Prediabetes   Hypertension   GERD (gastroesophageal reflux disease)   Hiatal hernia   S/P laparoscopic sleeve gastrectomy   Surgical Procedure: Laparoscopic Sleeve Gastrectomy with Hiatal Hernia Repair, upper endoscopy  Discharge Condition: Good Disposition: Home  Diet recommendation: Postoperative sleeve gastrectomy diet (liquids only)  Filed Weights   10/03/14 1043  Weight: 136.079 kg (300 lb)     Hospital Course:  The patient was admitted for a planned laparoscopic sleeve gastrectomy. Please see operative note. Preoperatively the patient was given 5000 units of subcutaneous heparin for DVT prophylaxis. Postoperative prophylactic Lovenox dosing was started on the morning of postoperative day 1. The patient underwent an upper GI on postoperative day 1 which demonstrated no extravasation of contrast and emptying of the contrast into the small bowel. The patient was started on ice chips and water which they tolerated. On postoperative day 2 The patient's diet was advanced to protein shakes which they also tolerated. The patient was ambulating without difficulty. Their vital signs are stable without fever or tachycardia. Their hemoglobin had remained stable. She was doing well. She had minimal nausea and little pain. She looked well.  The patient had received discharge instructions and  counseling. They were deemed stable for discharge.  BP 132/77 mmHg  Pulse 78  Temp(Src) 98.4 F (36.9 C) (Oral)  Resp 18  Ht 5\' 6"  (1.676 m)  Wt 136.079 kg (300 lb)  BMI 48.44 kg/m2  SpO2 100%  Gen: alert, NAD, non-toxic appearing Pupils: equal, no scleral icterus Pulm: Lungs clear to auscultation, symmetric chest rise CV: regular rate and rhythm Abd: soft, miminal tenderness, nondistended. Some bruising at extraction site. No cellulitis. No incisional hernia Ext: no edema, no calf tenderness Skin: no rash, no jaundice   Discharge Instructions  Discharge Instructions    Ambulate hourly while awake    Complete by:  As directed      Call MD for:  difficulty breathing, headache or visual disturbances    Complete by:  As directed      Call MD for:  persistant dizziness or light-headedness    Complete by:  As directed      Call MD for:  persistant nausea and vomiting    Complete by:  As directed      Call MD for:  redness, tenderness, or signs of infection (pain, swelling, redness, odor or green/yellow discharge around incision site)    Complete by:  As directed      Call MD for:  severe uncontrolled pain    Complete by:  As directed      Call MD for:  temperature >101 F    Complete by:  As directed      Diet bariatric full liquid    Complete by:  As directed      Discharge instructions    Complete by:  As directed  See bariatric surgery discharge instructions WALK HOURLY WHILE AWAKE     Incentive spirometry    Complete by:  As directed   Perform hourly while awake            Medication List    STOP taking these medications        aspirin-acetaminophen-caffeine 250-250-65 MG tablet  Commonly known as:  EXCEDRIN MIGRAINE     diphenhydramine-acetaminophen 25-500 MG Tabs tablet  Commonly known as:  TYLENOL PM     hydrochlorothiazide 12.5 MG capsule  Commonly known as:  MICROZIDE     OSTEO BI-FLEX ADV JOINT SHIELD PO     traMADol 50 MG tablet  Commonly known  as:  ULTRAM      TAKE these medications        amitriptyline 25 MG tablet  Commonly known as:  ELAVIL  Take 50 mg by mouth at bedtime.     buPROPion 75 MG tablet  Commonly known as:  WELLBUTRIN  Take 1 tablet (75 mg total) by mouth 2 (two) times daily.     Calcium-Vitamin D 600-200 MG-UNIT tablet  Take 2 tablets by mouth daily.     cetirizine 10 MG tablet  Commonly known as:  ZYRTEC  Take 10 mg by mouth daily.     fish oil-omega-3 fatty acids 1000 MG capsule  Take 1,000 mg by mouth daily.     fluticasone 50 MCG/ACT nasal spray  Commonly known as:  FLONASE  Place 2 sprays into both nostrils daily as needed for allergies or rhinitis.     gabapentin 300 MG capsule  Commonly known as:  NEURONTIN  Take 600 mg by mouth 3 (three) times daily.  Notes to Patient:  May be too large to swallow, discuss with pharmacist the possibility of opening capsule.  Speak with prescribing physician to determine if another dosage form is available.     HYDROcodone-acetaminophen 7.5-325 mg/15 ml solution  Commonly known as:  HYCET  Take 10 mLs by mouth every 4 (four) hours as needed for moderate pain.     montelukast 10 MG tablet  Commonly known as:  SINGULAIR  Take 10 mg by mouth at bedtime as needed (allergies).     multivitamin with minerals tablet  Take 1 tablet by mouth daily.     pantoprazole 40 MG tablet  Commonly known as:  PROTONIX  Take 1 tablet (40 mg total) by mouth daily.     rOPINIRole 0.5 MG tablet  Commonly known as:  REQUIP  Take 2 tablets (1 mg total) by mouth at bedtime.     tetrahydrozoline-zinc 0.05-0.25 % ophthalmic solution  Commonly known as:  VISINE-AC  Place 2 drops into both eyes daily.     VITAMIN D (CHOLECALCIFEROL) PO  Take 5,000 Units by mouth daily.           Follow-up Information    Follow up with [name], MD. Go on 10/18/2014.   Specialty:  General Surgery   Why:  For Post-Op Check at 11:15   Contact information:   [place] [address] [phone]        The results of significant diagnostics from this hospitalization (including imaging, microbiology, ancillary and laboratory) are listed below for reference.    Significant Diagnostic Studies: Dg Ugi W/water Sol Cm  10/04/2014   CLINICAL DATA:  Postop day 1 gastric sleeve and hiatal hernia repair. Nausea.  EXAM: WATER SOLUBLE UPPER GI SERIES  TECHNIQUE: Single-column upper  GI series was performed using water soluble contrast.  CONTRAST:  50 mL Omnipaque p.o.  COMPARISON:  06/22/2014  FLUOROSCOPY TIME:  Radiation Exposure Index (as provided by the fluoroscopic device):  If the device does not provide the exposure index:  Fluoroscopy Time (in minutes and seconds):  0 minutes 45 second  Number of Acquired Images:  FINDINGS: Preliminary KUB is negative for ileus or bowel obstruction. Stool in the colon.  Esophageal motility and mucosa are normal. The gastroesophageal junction is patent. No hiatal hernia. Postop gastric sleeve in satisfactory appearance. No leak or stricture. There is prompt emptying of the stomach into the duodenum which appears normal. No mucosal irregularity.  IMPRESSION: Negative for leak or obstruction.  Negative for hiatal hernia.   Electronically Signed   By: Franchot Gallo M.D.   On: 10/04/2014 09:45    Labs: Basic Metabolic Panel:  Recent Labs Lab 09/29/14 1350 10/04/14 0510  NA 141 139  K 4.8 3.8  CL 105 106  CO2 29 26  GLUCOSE 111* 141*  BUN 25* 12  CREATININE 0.92 0.82  CALCIUM 9.6 8.9   Liver Function Tests:  Recent Labs Lab 09/29/14 1350 10/04/14 0510  AST 26 49*  ALT 20 28  ALKPHOS 117 94  BILITOT 0.2* 0.5  PROT 8.1 7.2  ALBUMIN 4.2 3.7    CBC:  Recent Labs Lab 09/29/14 1350 10/03/14 1528 10/04/14 0510 10/04/14 1622 10/05/14 0601  WBC 8.5  --  9.6  --  8.8  NEUTROABS 3.9  --  6.2  --  4.4  HGB 13.4 13.8 12.4 12.4 12.5  HCT 42.5 42.1 38.3 37.4 39.0  MCV 86.9  --  84.0  --  85.5  PLT  266  --  238  --  223    CBG:  Recent Labs Lab 10/03/14 1046  GLUCAP 115*    Principal Problem:   Obesity, Class III, BMI 40-49.9 (morbid obesity) Active Problems:   OSA (obstructive sleep apnea)   Back pain   Prediabetes   Hypertension   GERD (gastroesophageal reflux disease)   Hiatal hernia   S/P laparoscopic sleeve gastrectomy   Time coordinating discharge: 67minutes  Signed:  [name], MD [facility], [place] 10/05/2014, 1:06 PM

## 2014-10-09 ENCOUNTER — Telehealth (HOSPITAL_COMMUNITY): Payer: Self-pay

## 2014-10-09 NOTE — Telephone Encounter (Signed)

## 2014-10-17 ENCOUNTER — Encounter: Payer: BLUE CROSS/BLUE SHIELD | Attending: Internal Medicine

## 2014-10-17 VITALS — Ht 65.0 in | Wt 290.0 lb

## 2014-10-17 DIAGNOSIS — Z713 Dietary counseling and surveillance: Secondary | ICD-10-CM | POA: Diagnosis not present

## 2014-10-17 DIAGNOSIS — Z6841 Body Mass Index (BMI) 40.0 and over, adult: Secondary | ICD-10-CM | POA: Insufficient documentation

## 2014-10-17 DIAGNOSIS — E669 Obesity, unspecified: Secondary | ICD-10-CM | POA: Insufficient documentation

## 2014-10-17 DIAGNOSIS — E66813 Obesity, class 3: Secondary | ICD-10-CM

## 2014-10-17 NOTE — Progress Notes (Signed)
Bariatric Class:  Appt start time: 1530 end time:  1630.  2 Week Post-Operative Nutrition Class  Patient was seen on 10/17/2014 for Post-Operative [facility].   Surgery date: 10/03/14 Surgery type: Sleeve gastrectomy Start weight at [facility]: 313.5 lbs on 06/28/14 Weight today: 290.0 lbs  Weight change: 22.7 lbs  TANITA  BODY COMP RESULTS  09/18/14 10/17/14   BMI (kg/m^2) N/A 48.3   Fat Mass (lbs)  165.5   Fat Free Mass (lbs)  124.5   Total Body Water (lbs)  91.0    The following the learning objectives were met by the patient during this course:  Identifies Phase 3A (Soft, High Proteins) Dietary Goals and will begin from 2 weeks post-operatively to 2 months post-operatively  Identifies appropriate sources of fluids and proteins   States protein recommendations and appropriate sources post-operatively  Identifies the need for appropriate texture modifications, mastication, and bite sizes when consuming solids  Identifies appropriate multivitamin and calcium sources post-operatively  Describes the need for physical activity post-operatively and will follow MD recommendations  States when to call healthcare provider regarding medication questions or post-operative complications  Handouts given during class include:  Phase 3A: Soft, High Protein Diet Handout  Follow-Up Plan: Patient will follow-up at [facility] in 6 weeks for 2 month post-op nutrition visit for diet advancement per MD.

## 2014-11-28 ENCOUNTER — Ambulatory Visit: Payer: BLUE CROSS/BLUE SHIELD | Admitting: Dietician

## 2014-12-25 ENCOUNTER — Other Ambulatory Visit: Payer: Self-pay

## 2014-12-25 MED ORDER — ROPINIROLE HCL 0.5 MG PO TABS: 1.0000 mg | ORAL_TABLET | Freq: Every day | ORAL | Status: DC

## 2015-05-21 ENCOUNTER — Other Ambulatory Visit: Payer: Self-pay | Admitting: General Surgery

## 2015-05-21 DIAGNOSIS — R1011 Right upper quadrant pain: Secondary | ICD-10-CM

## 2015-05-28 ENCOUNTER — Ambulatory Visit
Admission: RE | Admit: 2015-05-28 | Discharge: 2015-05-28 | Disposition: A | Discharge status: Home / Self Care | Payer: No Typology Code available for payment source | Source: Ambulatory Visit | Attending: General Surgery | Admitting: General Surgery

## 2015-05-28 DIAGNOSIS — R1011 Right upper quadrant pain: Secondary | ICD-10-CM

## 2015-06-06 ENCOUNTER — Ambulatory Visit: Payer: Self-pay | Admitting: General Surgery

## 2015-07-11 ENCOUNTER — Encounter (HOSPITAL_COMMUNITY)
Admission: RE | Admit: 2015-07-11 | Discharge: 2015-07-11 | Disposition: A | Discharge status: Home / Self Care | Payer: BLUE CROSS/BLUE SHIELD | Source: Ambulatory Visit | Attending: General Surgery | Admitting: General Surgery

## 2015-07-11 ENCOUNTER — Encounter (HOSPITAL_COMMUNITY): Payer: Self-pay

## 2015-07-11 DIAGNOSIS — I10 Essential (primary) hypertension: Secondary | ICD-10-CM | POA: Diagnosis not present

## 2015-07-11 DIAGNOSIS — Z9884 Bariatric surgery status: Secondary | ICD-10-CM | POA: Diagnosis not present

## 2015-07-11 DIAGNOSIS — Z6836 Body mass index (BMI) 36.0-36.9, adult: Secondary | ICD-10-CM | POA: Diagnosis not present

## 2015-07-11 DIAGNOSIS — K219 Gastro-esophageal reflux disease without esophagitis: Secondary | ICD-10-CM | POA: Diagnosis not present

## 2015-07-11 DIAGNOSIS — K8010 Calculus of gallbladder with chronic cholecystitis without obstruction: Secondary | ICD-10-CM | POA: Diagnosis not present

## 2015-07-11 DIAGNOSIS — K449 Diaphragmatic hernia without obstruction or gangrene: Secondary | ICD-10-CM | POA: Diagnosis not present

## 2015-07-11 DIAGNOSIS — Z79899 Other long term (current) drug therapy: Secondary | ICD-10-CM | POA: Diagnosis not present

## 2015-07-11 DIAGNOSIS — K8080 Other cholelithiasis without obstruction: Secondary | ICD-10-CM | POA: Diagnosis present

## 2015-07-11 DIAGNOSIS — I341 Nonrheumatic mitral (valve) prolapse: Secondary | ICD-10-CM | POA: Diagnosis not present

## 2015-07-11 DIAGNOSIS — G4733 Obstructive sleep apnea (adult) (pediatric): Secondary | ICD-10-CM | POA: Diagnosis not present

## 2015-07-11 DIAGNOSIS — M1990 Unspecified osteoarthritis, unspecified site: Secondary | ICD-10-CM | POA: Diagnosis not present

## 2015-07-11 HISTORY — DX: Personal history of other diseases of the respiratory system: Z87.09

## 2015-07-11 HISTORY — DX: Unspecified hearing loss, unspecified ear: H91.90

## 2015-07-11 HISTORY — DX: Tinnitus, unspecified ear: H93.19

## 2015-07-11 HISTORY — DX: Presence of spectacles and contact lenses: Z97.3

## 2015-07-11 HISTORY — DX: Personal history of other specified conditions: Z87.898

## 2015-07-11 HISTORY — DX: Personal history of urinary (tract) infections: Z87.440

## 2015-07-11 LAB — COMPREHENSIVE METABOLIC PANEL
ALBUMIN: 3.9 g/dL (ref 3.5–5.0)
ALK PHOS: 96 U/L (ref 38–126)
ALT: 26 U/L (ref 14–54)
AST: 24 U/L (ref 15–41)
Anion gap: 6 (ref 5–15)
BILIRUBIN TOTAL: 0.6 mg/dL (ref 0.3–1.2)
BUN: 27 mg/dL — AB (ref 6–20)
CO2: 28 mmol/L (ref 22–32)
CREATININE: 0.7 mg/dL (ref 0.44–1.00)
Calcium: 9 mg/dL (ref 8.9–10.3)
Chloride: 104 mmol/L (ref 101–111)
GFR calc Af Amer: >60 mL/min (ref >60)
Glucose, Bld: 96 mg/dL (ref 65–99)
POTASSIUM: 4.2 mmol/L (ref 3.5–5.1)
Sodium: 138 mmol/L (ref 135–145)
Total Protein: 6.7 g/dL (ref 6.5–8.1)

## 2015-07-11 LAB — CBC WITH DIFFERENTIAL/PLATELET
BASOS ABS: 0 10*3/uL (ref 0.0–0.1)
Basophils Relative: 0 %
Eosinophils Absolute: 0.2 10*3/uL (ref 0.0–0.7)
Eosinophils Relative: 3 %
HEMATOCRIT: 38 % (ref 36.0–46.0)
HEMOGLOBIN: 12.6 g/dL (ref 12.0–15.0)
LYMPHS PCT: 49 %
Lymphs Abs: 2.7 10*3/uL (ref 0.7–4.0)
MCH: 29.4 pg (ref 26.0–34.0)
MCHC: 33.2 g/dL (ref 30.0–36.0)
MCV: 88.6 fL (ref 78.0–100.0)
MONO ABS: 0.4 10*3/uL (ref 0.1–1.0)
MONOS PCT: 7 %
NEUTROS ABS: 2.3 10*3/uL (ref 1.7–7.7)
NEUTROS PCT: 41 %
Platelets: 214 10*3/uL (ref 150–400)
RBC: 4.29 MIL/uL (ref 3.87–5.11)
RDW: 13.2 % (ref 11.5–15.5)
WBC: 5.5 10*3/uL (ref 4.0–10.5)

## 2015-07-11 NOTE — Patient Instructions (Signed)
Taylor Silva  07/11/2015   Your procedure is scheduled on: Thursday July 12, 2015  Report to [facility] Main  Entrance take [place]  elevators to 3rd floor to  [place] at 11:30 AM.  Call this number if you have problems the morning of surgery [phone]   Remember: ONLY 1 PERSON MAY GO WITH YOU TO SHORT STAY TO GET  READY MORNING OF [place].  Do not eat food After Midnight but may take clear liquids till 7:30 am day of surgery then nothing by mouth.      Take these medicines the morning of surgery with A SIP OF WATER: Gabapentin; May use eye drops if needed; Ratanidine                                You may not have any metal on your body including hair pins and              piercings  Do not wear jewelry, make-up, lotions, powders or perfumes, deodorant             Do not wear nail polish.  Do not shave  48 hours prior to surgery.               Do not bring valuables to the hospital. [place].  Contacts, dentures or bridgework may not be worn into surgery.      Patients discharged the day of surgery will not be allowed to drive home.  Name and phone number of your driver:Taylor Silva (husband)  _____________________________________________________________________             [facility] - Preparing for Surgery Before surgery, you can play an important role.  Because skin is not sterile, your skin needs to be as free of germs as possible.  You can reduce the number of germs on your skin by washing with CHG (chlorahexidine gluconate) soap before surgery.  CHG is an antiseptic cleaner which kills germs and bonds with the skin to continue killing germs even after washing. Please DO NOT use if you have an allergy to CHG or antibacterial soaps.  If your skin becomes reddened/irritated stop using the CHG and inform your nurse when you arrive at Short Stay. Do not shave (including legs and  underarms) for at least 48 hours prior to the first CHG shower.  You may shave your face/neck. Please follow these instructions carefully:  1.  Shower with CHG Soap the night before surgery and the  morning of Surgery.  2.  If you choose to wash your hair, wash your hair first as usual with your  normal  shampoo.  3.  After you shampoo, rinse your hair and body thoroughly to remove the  shampoo.                           4.  Use CHG as you would any other liquid soap.  You can apply chg directly  to the skin and wash                       Gently with a scrungie or clean washcloth.  5.  Apply the CHG Soap to your body ONLY FROM THE NECK DOWN.   Do not use on face/ open                           Wound or open sores. Avoid contact with eyes, ears mouth and genitals (private parts).                       Wash face,  Genitals (private parts) with your normal soap.             6.  Wash thoroughly, paying special attention to the area where your surgery  will be performed.  7.  Thoroughly rinse your body with warm water from the neck down.  8.  DO NOT shower/wash with your normal soap after using and rinsing off  the CHG Soap.                9.  Pat yourself dry with a clean towel.            10.  Wear clean pajamas.            11.  Place clean sheets on your bed the night of your first shower and do not  sleep with pets. Day of Surgery : Do not apply any lotions/deodorants the morning of surgery.  Please wear clean clothes to the hospital/surgery center.  FAILURE TO FOLLOW THESE INSTRUCTIONS MAY RESULT IN THE CANCELLATION OF YOUR SURGERY PATIENT SIGNATURE_________________________________  NURSE SIGNATURE__________________________________  ________________________________________________________________________    CLEAR LIQUID DIET   Foods Allowed                                                                     Foods Excluded  Coffee and tea, regular and decaf                              liquids that you cannot  Plain Jell-O in any flavor                                             see through such as: Fruit ices (not with fruit pulp)                                     milk, soups, orange juice  Iced Popsicles                                    All solid food Carbonated beverages, regular and diet                                    Cranberry, grape and apple juices Sports drinks like Gatorade Lightly seasoned clear broth or consume(fat free) Sugar, honey syrup  Sample Menu Breakfast  Lunch                                     Supper Cranberry juice                    Beef broth                            Chicken broth Jell-O                                     Grape juice                           Apple juice Coffee or tea                        Jell-O                                      Popsicle                                                Coffee or tea                        Coffee or tea  _____________________________________________________________________

## 2015-07-11 NOTE — Progress Notes (Signed)
CMP results per epic per PAT visit 07/11/2015 sent to Dr Redmond Pulling

## 2015-07-12 ENCOUNTER — Ambulatory Visit (HOSPITAL_COMMUNITY): Payer: BLUE CROSS/BLUE SHIELD

## 2015-07-12 ENCOUNTER — Ambulatory Visit (HOSPITAL_COMMUNITY): Payer: BLUE CROSS/BLUE SHIELD | Admitting: Anesthesiology

## 2015-07-12 ENCOUNTER — Observation Stay (HOSPITAL_COMMUNITY)
Admission: RE | Admit: 2015-07-12 | Discharge: 2015-07-12 | Disposition: A | Discharge status: Home / Self Care | Payer: BLUE CROSS/BLUE SHIELD | Source: Ambulatory Visit | Attending: General Surgery | Admitting: General Surgery

## 2015-07-12 ENCOUNTER — Encounter (HOSPITAL_COMMUNITY): Payer: Self-pay

## 2015-07-12 ENCOUNTER — Encounter (HOSPITAL_COMMUNITY)
Admission: RE | Disposition: A | Discharge status: Home / Self Care | Payer: Self-pay | Source: Ambulatory Visit | Attending: General Surgery

## 2015-07-12 DIAGNOSIS — Z6836 Body mass index (BMI) 36.0-36.9, adult: Secondary | ICD-10-CM | POA: Insufficient documentation

## 2015-07-12 DIAGNOSIS — Z79899 Other long term (current) drug therapy: Secondary | ICD-10-CM | POA: Insufficient documentation

## 2015-07-12 DIAGNOSIS — K449 Diaphragmatic hernia without obstruction or gangrene: Secondary | ICD-10-CM | POA: Insufficient documentation

## 2015-07-12 DIAGNOSIS — K219 Gastro-esophageal reflux disease without esophagitis: Secondary | ICD-10-CM | POA: Insufficient documentation

## 2015-07-12 DIAGNOSIS — M1990 Unspecified osteoarthritis, unspecified site: Secondary | ICD-10-CM | POA: Insufficient documentation

## 2015-07-12 DIAGNOSIS — G4733 Obstructive sleep apnea (adult) (pediatric): Secondary | ICD-10-CM | POA: Insufficient documentation

## 2015-07-12 DIAGNOSIS — K8010 Calculus of gallbladder with chronic cholecystitis without obstruction: Secondary | ICD-10-CM | POA: Diagnosis not present

## 2015-07-12 DIAGNOSIS — I341 Nonrheumatic mitral (valve) prolapse: Secondary | ICD-10-CM | POA: Insufficient documentation

## 2015-07-12 DIAGNOSIS — Z419 Encounter for procedure for purposes other than remedying health state, unspecified: Secondary | ICD-10-CM

## 2015-07-12 DIAGNOSIS — Z9049 Acquired absence of other specified parts of digestive tract: Secondary | ICD-10-CM

## 2015-07-12 DIAGNOSIS — I10 Essential (primary) hypertension: Secondary | ICD-10-CM | POA: Insufficient documentation

## 2015-07-12 DIAGNOSIS — Z9884 Bariatric surgery status: Secondary | ICD-10-CM | POA: Insufficient documentation

## 2015-07-12 HISTORY — PX: CHOLECYSTECTOMY: SHX55

## 2015-07-12 LAB — GLUCOSE, CAPILLARY: GLUCOSE-CAPILLARY: 83 mg/dL (ref 65–99)

## 2015-07-12 SURGERY — LAPAROSCOPIC CHOLECYSTECTOMY WITH INTRAOPERATIVE CHOLANGIOGRAM
Anesthesia: General | Site: Abdomen

## 2015-07-12 MED ORDER — PROPOFOL 10 MG/ML IV BOLUS
INTRAVENOUS | Status: DC | PRN
  Administered 2015-07-12: 200 mg via INTRAVENOUS
  Administered 2015-07-12: 50 mg via INTRAVENOUS

## 2015-07-12 MED ORDER — ONDANSETRON HCL 4 MG/2ML IJ SOLN
INTRAMUSCULAR | Status: AC
  Filled 2015-07-12: qty 2

## 2015-07-12 MED ORDER — CHLORHEXIDINE GLUCONATE 4 % EX LIQD: 1.0000 "application " | Freq: Once | CUTANEOUS | Status: DC

## 2015-07-12 MED ORDER — SODIUM CHLORIDE 0.9 % IV SOLN
INTRAVENOUS | Status: DC | PRN
  Administered 2015-07-12: 12 mL

## 2015-07-12 MED ORDER — SUGAMMADEX SODIUM 200 MG/2ML IV SOLN
INTRAVENOUS | Status: DC | PRN
  Administered 2015-07-12: 200 mg via INTRAVENOUS

## 2015-07-12 MED ORDER — FENTANYL CITRATE (PF) 100 MCG/2ML IJ SOLN: 25.0000 ug | INTRAMUSCULAR | Status: DC | PRN

## 2015-07-12 MED ORDER — PROPOFOL 500 MG/50ML IV EMUL
INTRAVENOUS | Status: DC | PRN
  Administered 2015-07-12: 100 ug/kg/min via INTRAVENOUS

## 2015-07-12 MED ORDER — MIDAZOLAM HCL 2 MG/2ML IJ SOLN
INTRAMUSCULAR | Status: AC
  Filled 2015-07-12: qty 2

## 2015-07-12 MED ORDER — SODIUM CHLORIDE 0.9% FLUSH: 3.0000 mL | INTRAVENOUS | Status: DC | PRN

## 2015-07-12 MED ORDER — SUGAMMADEX SODIUM 200 MG/2ML IV SOLN
INTRAVENOUS | Status: AC
  Filled 2015-07-12: qty 2

## 2015-07-12 MED ORDER — HYDROCODONE-ACETAMINOPHEN 5-325 MG PO TABS: 1.0000 | ORAL_TABLET | ORAL | Status: DC | PRN

## 2015-07-12 MED ORDER — SCOPOLAMINE 1 MG/3DAYS TD PT72
MEDICATED_PATCH | TRANSDERMAL | Status: AC
  Filled 2015-07-12: qty 1

## 2015-07-12 MED ORDER — PROPOFOL 10 MG/ML IV BOLUS
INTRAVENOUS | Status: AC
  Filled 2015-07-12: qty 40

## 2015-07-12 MED ORDER — LIDOCAINE HCL (CARDIAC) 20 MG/ML IV SOLN
INTRAVENOUS | Status: DC | PRN
  Administered 2015-07-12: 100 mg via INTRAVENOUS

## 2015-07-12 MED ORDER — LACTATED RINGERS IV SOLN
INTRAVENOUS | Status: DC
  Administered 2015-07-12 (×2): via INTRAVENOUS

## 2015-07-12 MED ORDER — DEXAMETHASONE SODIUM PHOSPHATE 10 MG/ML IJ SOLN
INTRAMUSCULAR | Status: DC | PRN
  Administered 2015-07-12: 10 mg via INTRAVENOUS

## 2015-07-12 MED ORDER — BUPIVACAINE HCL (PF) 0.25 % IJ SOLN
INTRAMUSCULAR | Status: AC
  Filled 2015-07-12: qty 30

## 2015-07-12 MED ORDER — DEXAMETHASONE SODIUM PHOSPHATE 10 MG/ML IJ SOLN
INTRAMUSCULAR | Status: AC
  Filled 2015-07-12: qty 1

## 2015-07-12 MED ORDER — ACETAMINOPHEN 650 MG RE SUPP
650.0000 mg | RECTAL | Status: DC | PRN
  Filled 2015-07-12: qty 1

## 2015-07-12 MED ORDER — ACETAMINOPHEN 325 MG PO TABS: 650.0000 mg | ORAL_TABLET | ORAL | Status: DC | PRN

## 2015-07-12 MED ORDER — METOCLOPRAMIDE HCL 5 MG/ML IJ SOLN
INTRAMUSCULAR | Status: AC
  Filled 2015-07-12: qty 2

## 2015-07-12 MED ORDER — HYDROMORPHONE HCL 1 MG/ML IJ SOLN
INTRAMUSCULAR | Status: AC
  Filled 2015-07-12: qty 1

## 2015-07-12 MED ORDER — KETOROLAC TROMETHAMINE 30 MG/ML IJ SOLN
INTRAMUSCULAR | Status: AC
  Filled 2015-07-12: qty 1

## 2015-07-12 MED ORDER — PROPOFOL 10 MG/ML IV BOLUS
INTRAVENOUS | Status: AC
  Filled 2015-07-12: qty 20

## 2015-07-12 MED ORDER — BUPIVACAINE-EPINEPHRINE 0.25% -1:200000 IJ SOLN
INTRAMUSCULAR | Status: DC | PRN
  Administered 2015-07-12: 32 mL

## 2015-07-12 MED ORDER — SCOPOLAMINE 1 MG/3DAYS TD PT72
MEDICATED_PATCH | TRANSDERMAL | Status: DC | PRN
  Administered 2015-07-12: 1 via TRANSDERMAL

## 2015-07-12 MED ORDER — IOPAMIDOL (ISOVUE-300) INJECTION 61%
INTRAVENOUS | Status: AC
  Filled 2015-07-12: qty 50

## 2015-07-12 MED ORDER — KETOROLAC TROMETHAMINE 30 MG/ML IJ SOLN
INTRAMUSCULAR | Status: DC | PRN
  Administered 2015-07-12: 30 mg via INTRAVENOUS

## 2015-07-12 MED ORDER — MIDAZOLAM HCL 5 MG/5ML IJ SOLN
INTRAMUSCULAR | Status: DC | PRN
  Administered 2015-07-12: 2 mg via INTRAVENOUS

## 2015-07-12 MED ORDER — METOCLOPRAMIDE HCL 5 MG/ML IJ SOLN
INTRAMUSCULAR | Status: DC | PRN
  Administered 2015-07-12: 10 mg via INTRAVENOUS

## 2015-07-12 MED ORDER — 0.9 % SODIUM CHLORIDE (POUR BTL) OPTIME
TOPICAL | Status: DC | PRN
  Administered 2015-07-12: 1000 mL

## 2015-07-12 MED ORDER — KCL IN DEXTROSE-NACL 20-5-0.45 MEQ/L-%-% IV SOLN: INTRAVENOUS | Status: DC

## 2015-07-12 MED ORDER — LABETALOL HCL 5 MG/ML IV SOLN
INTRAVENOUS | Status: DC | PRN
Start: 2015-07-12 — End: 2015-07-12
  Administered 2015-07-12: 2.5 mg via INTRAVENOUS

## 2015-07-12 MED ORDER — ROCURONIUM BROMIDE 100 MG/10ML IV SOLN
INTRAVENOUS | Status: AC
  Filled 2015-07-12: qty 1

## 2015-07-12 MED ORDER — LABETALOL HCL 5 MG/ML IV SOLN
INTRAVENOUS | Status: AC
  Filled 2015-07-12: qty 4

## 2015-07-12 MED ORDER — HYDROMORPHONE HCL 1 MG/ML IJ SOLN
0.2500 mg | INTRAMUSCULAR | Status: DC | PRN
  Administered 2015-07-12: 0.25 mg via INTRAVENOUS
  Administered 2015-07-12 (×2): 0.5 mg via INTRAVENOUS
  Administered 2015-07-12: 0.25 mg via INTRAVENOUS

## 2015-07-12 MED ORDER — SODIUM CHLORIDE 0.9% FLUSH: 3.0000 mL | Freq: Two times a day (BID) | INTRAVENOUS | Status: DC

## 2015-07-12 MED ORDER — SCOPOLAMINE 1 MG/3DAYS TD PT72
1.0000 | MEDICATED_PATCH | TRANSDERMAL | Status: DC
  Administered 2015-07-12: 1.5 mg via TRANSDERMAL

## 2015-07-12 MED ORDER — FENTANYL CITRATE (PF) 100 MCG/2ML IJ SOLN
INTRAMUSCULAR | Status: DC | PRN
  Administered 2015-07-12: 100 ug via INTRAVENOUS
  Administered 2015-07-12: 150 ug via INTRAVENOUS

## 2015-07-12 MED ORDER — SUCCINYLCHOLINE CHLORIDE 20 MG/ML IJ SOLN
INTRAMUSCULAR | Status: DC | PRN
  Administered 2015-07-12: 40 mg via INTRAVENOUS
  Administered 2015-07-12: 100 mg via INTRAVENOUS

## 2015-07-12 MED ORDER — LIDOCAINE HCL (CARDIAC) 20 MG/ML IV SOLN
INTRAVENOUS | Status: AC
  Filled 2015-07-12: qty 5

## 2015-07-12 MED ORDER — BUPIVACAINE-EPINEPHRINE 0.25% -1:200000 IJ SOLN
INTRAMUSCULAR | Status: AC
  Filled 2015-07-12: qty 1

## 2015-07-12 MED ORDER — ROCURONIUM BROMIDE 100 MG/10ML IV SOLN
INTRAVENOUS | Status: DC | PRN
  Administered 2015-07-12: 10 mg via INTRAVENOUS
  Administered 2015-07-12: 30 mg via INTRAVENOUS

## 2015-07-12 MED ORDER — CIPROFLOXACIN IN D5W 400 MG/200ML IV SOLN
INTRAVENOUS | Status: AC
  Filled 2015-07-12: qty 200

## 2015-07-12 MED ORDER — ONDANSETRON HCL 4 MG/2ML IJ SOLN
INTRAMUSCULAR | Status: DC | PRN
  Administered 2015-07-12: 4 mg via INTRAVENOUS

## 2015-07-12 MED ORDER — LACTATED RINGERS IR SOLN
Status: DC | PRN
  Administered 2015-07-12: 1000 mL

## 2015-07-12 MED ORDER — SODIUM CHLORIDE 0.9 % IV SOLN: 250.0000 mL | INTRAVENOUS | Status: DC | PRN

## 2015-07-12 MED ORDER — PROMETHAZINE HCL 25 MG/ML IJ SOLN: 6.2500 mg | INTRAMUSCULAR | Status: DC | PRN

## 2015-07-12 MED ORDER — CIPROFLOXACIN IN D5W 400 MG/200ML IV SOLN
400.0000 mg | INTRAVENOUS | Status: AC
  Administered 2015-07-12: 400 mg via INTRAVENOUS

## 2015-07-12 MED ORDER — FENTANYL CITRATE (PF) 250 MCG/5ML IJ SOLN
INTRAMUSCULAR | Status: AC
  Filled 2015-07-12: qty 5

## 2015-07-12 SURGICAL SUPPLY — 57 items
ADH SKN CLS APL DERMABOND .7 (GAUZE/BANDAGES/DRESSINGS) ×1
APL SKNCLS STERI-STRIP NONHPOA (GAUZE/BANDAGES/DRESSINGS) ×1
APL SRG 38 LTWT LNG FL B (MISCELLANEOUS)
APPLICATOR ARISTA FLEXITIP XL (MISCELLANEOUS) IMPLANT
APPLIER CLIP 5 13 M/L LIGAMAX5 (MISCELLANEOUS) ×3
APPLIER CLIP ROT 10 11.4 M/L (STAPLE)
APR CLP MED LRG 11.4X10 (STAPLE)
APR CLP MED LRG 5 ANG JAW (MISCELLANEOUS) ×1
BAG SPEC RTRVL 10 TROC 200 (ENDOMECHANICALS) ×1
BANDAGE ADH SHEER 1  50/CT (GAUZE/BANDAGES/DRESSINGS) ×12 IMPLANT
BENZOIN TINCTURE PRP APPL 2/3 (GAUZE/BANDAGES/DRESSINGS) ×3 IMPLANT
CABLE HIGH FREQUENCY MONO STRZ (ELECTRODE) ×3 IMPLANT
CHLORAPREP W/TINT 26ML (MISCELLANEOUS) ×3 IMPLANT
CLIP APPLIE 5 13 M/L LIGAMAX5 (MISCELLANEOUS) ×1 IMPLANT
CLIP APPLIE ROT 10 11.4 M/L (STAPLE) IMPLANT
CLOSURE STERI-STRIP 1/4X4 (GAUZE/BANDAGES/DRESSINGS) ×3 IMPLANT
CLOSURE WOUND 1/4X4 (GAUZE/BANDAGES/DRESSINGS) ×1
COVER MAYO STAND STRL (DRAPES) IMPLANT
COVER SURGICAL LIGHT HANDLE (MISCELLANEOUS) ×3 IMPLANT
DECANTER SPIKE VIAL GLASS SM (MISCELLANEOUS) ×3 IMPLANT
DERMABOND ADVANCED (GAUZE/BANDAGES/DRESSINGS) ×2
DERMABOND ADVANCED .7 DNX12 (GAUZE/BANDAGES/DRESSINGS) ×1 IMPLANT
DRAPE C-ARM 42X120 X-RAY (DRAPES) ×2 IMPLANT
DRAPE LAPAROSCOPIC ABDOMINAL (DRAPES) IMPLANT
DRSG TEGADERM 2-3/8X2-3/4 SM (GAUZE/BANDAGES/DRESSINGS) ×2 IMPLANT
DRSG TEGADERM 4X4.75 (GAUZE/BANDAGES/DRESSINGS) ×3 IMPLANT
ELECT PENCIL ROCKER SW 15FT (MISCELLANEOUS) ×3 IMPLANT
ELECT REM PT RETURN 9FT ADLT (ELECTROSURGICAL) ×3
ELECTRODE REM PT RTRN 9FT ADLT (ELECTROSURGICAL) ×1 IMPLANT
GAUZE SPONGE 2X2 8PLY STRL LF (GAUZE/BANDAGES/DRESSINGS) IMPLANT
GLOVE BIO SURGEON STRL SZ7.5 (GLOVE) ×3 IMPLANT
GLOVE INDICATOR 8.0 STRL GRN (GLOVE) ×3 IMPLANT
GOWN STRL REUS W/TWL XL LVL3 (GOWN DISPOSABLE) ×9 IMPLANT
HEMOSTAT ARISTA ABSORB 3G PWDR (MISCELLANEOUS) IMPLANT
HEMOSTAT SNOW SURGICEL 2X4 (HEMOSTASIS) IMPLANT
KIT BASIN OR (CUSTOM PROCEDURE TRAY) ×3 IMPLANT
L-HOOK LAP DISP 36CM (ELECTROSURGICAL) ×3
LHOOK LAP DISP 36CM (ELECTROSURGICAL) ×1 IMPLANT
POSITIONER SURGICAL ARM (MISCELLANEOUS) IMPLANT
POUCH RETRIEVAL ECOSAC 10 (ENDOMECHANICALS) ×1 IMPLANT
POUCH RETRIEVAL ECOSAC 10MM (ENDOMECHANICALS) ×2
SCISSORS LAP 5X35 DISP (ENDOMECHANICALS) ×3 IMPLANT
SET CHOLANGIOGRAPH MIX (MISCELLANEOUS) IMPLANT
SET IRRIG TUBING LAPAROSCOPIC (IRRIGATION / IRRIGATOR) ×3 IMPLANT
SLEEVE XCEL OPT CAN 5 100 (ENDOMECHANICALS) ×6 IMPLANT
SPONGE GAUZE 2X2 STER 10/PKG (GAUZE/BANDAGES/DRESSINGS) ×2
STRIP CLOSURE SKIN 1/4X4 (GAUZE/BANDAGES/DRESSINGS) ×1 IMPLANT
SUT MNCRL AB 4-0 PS2 18 (SUTURE) ×3 IMPLANT
SUT VICRYL 0 UR6 27IN ABS (SUTURE) IMPLANT
TAPE CLOTH 4X10 WHT NS (GAUZE/BANDAGES/DRESSINGS) IMPLANT
TOWEL OR 17X26 10 PK STRL BLUE (TOWEL DISPOSABLE) ×3 IMPLANT
TOWEL OR NON WOVEN STRL DISP B (DISPOSABLE) ×3 IMPLANT
TRAY LAPAROSCOPIC (CUSTOM PROCEDURE TRAY) ×3 IMPLANT
TROCAR BLADELESS OPT 5 100 (ENDOMECHANICALS) ×3 IMPLANT
TROCAR XCEL BLUNT TIP 100MML (ENDOMECHANICALS) ×3 IMPLANT
TROCAR XCEL NON-BLD 11X100MML (ENDOMECHANICALS) IMPLANT
TUBING INSUF HEATED (TUBING) ×3 IMPLANT

## 2015-07-12 NOTE — Progress Notes (Signed)
Dr Tobias Alexander notified that pharmacy called RN and informed RN of anaphylaxis with Codiene allergy and Pharmacy wanted to claatify if MD would like to change to Fentanyl as used previously post-op.  Dr Tobias Alexander reports he is fine with use of Dilaudid as ordered.

## 2015-07-12 NOTE — H&P (Signed)
Taylor Silva is an 60 y.o. female.   Chief Complaint: here for surgery HPI: 60 yo female s/p lap sleeve gastrectomy with HHR who developed postprandial RUQ several months after weight loss surgery. Workup revealed gallstones and mildly elevated ast/alt. Please see notes for additional details.   Past Medical History  Diagnosis Date  . Fructose intolerance   . Arthritis   . Seasonal allergies   . Restless legs   . Depression   . Hyperlipidemia   . Obesity   . Heart murmur     echo done 80's neg  . Sleep apnea     Intolerant to CPAP last sleep study  . Asthma   . Pneumonia     hx  . GERD (gastroesophageal reflux disease)   . H/O hiatal hernia   . Headache     migraines  . PONV (postoperative nausea and vomiting)     patch used last time  . OSA (obstructive sleep apnea)   . Pre-diabetes   . DJD (degenerative joint disease), lumbar   . Pinched nerve     in back  . Hypertension     currently taking no medications   . History of bronchitis   . Wears glasses   . Hard of hearing     bilat   . Tinnitus   . History of vertigo   . Fall   . History of frequent urinary tract infections     Past Surgical History  Procedure Laterality Date  . Knee surgery  04/2009    right knee, meniscus tear  . Wrist surgery  1980s    cyst removed from right wrist  . Abdominal hysterectomy  1995    No oophorectomy  . Other surgical history  1995?    removed scar tissue from colon  . Thigh tuck Bilateral     inner thigh tuck  . Orif ankle fracture Right 10/17/2013    Procedure: OPEN REDUCTION INTERNAL FIXATION (ORIF) RIGHT ANKLE DISTAL FIBULA/LATERAL MALLEOLUS;  Surgeon: Vickey Huger, MD;  Location: [facility];  Service: Orthopedics;  Laterality: Right;  . Breath tek h pylori N/A 06/23/2014    Procedure: BREATH TEK H PYLORI;  Surgeon: Greer Pickerel, MD;  Location: [facility];  Service: General;  Laterality: N/A;  . Diagnostic laparoscopy    . Laparoscopic gastric sleeve resection with hiatal  hernia repair N/A 10/03/2014    Procedure: LAPAROSCOPIC GASTRIC SLEEVE RESECTION WITH HIATAL HERNIA REPAIR W UPPER ENDO;  Surgeon: Greer Pickerel, MD;  Location: [facility];  Service: General;  Laterality: N/A;  . Upper gi endoscopy N/A 10/03/2014    Procedure: UPPER GI ENDOSCOPY;  Surgeon: Greer Pickerel, MD;  Location: [facility];  Service: General;  Laterality: N/A;  . Dilation and curettage of uterus      Family History  Problem Relation Age of Onset  . Diabetes Mother   . Hyperlipidemia Mother   . Hypertension Mother   . Heart attack Mother     67 y/o  . Diabetes Sister   . Hyperlipidemia Sister   . Hypertension Sister   . Diabetes Brother   . Hyperlipidemia Brother   . Hypertension Brother   . Heart attack Brother   . Diabetes Maternal Grandmother   . Hyperlipidemia Maternal Grandmother   . Hypertension Maternal Grandmother   . Heart disease Father   . Leukemia Father   . Colon cancer Neg Hx   . Breast cancer Other     GM   Social History:  reports that she has never smoked. She has never used smokeless tobacco. She reports that she does not drink alcohol or use illicit drugs.  Allergies:  Allergies  Allergen Reactions  . Codeine Anaphylaxis  . Erythromycin Hives  . Tetracyclines & Related Hives  . Darvon [Propoxyphene] Photosensitivity    Blurry vision for 3 days  . Morphine And Related Dermatitis    "burning in veins"  . Other Other (See Comments)    Tomatoes upset stomach    . Penicillins Hives    Has patient had a PCN reaction causing immediate rash, facial/tongue/throat swelling, SOB or lightheadedness with hypotension: Yes Has patient had a PCN reaction causing severe rash involving mucus membranes or skin necrosis: No Has patient had a PCN reaction that required hospitalization No Has patient had a PCN reaction occurring within the last 10 years: No If all of the above answers are "NO", then may proceed with Cephalosporin use.   . Sulfa Antibiotics Nausea And  Vomiting    Medications Prior to Admission  Medication Sig Dispense Refill  . Calcium-Vitamin D 600-200 MG-UNIT per tablet Take 2 tablets by mouth daily.    Marland Kitchen gabapentin (NEURONTIN) 600 MG tablet Take 600 mg by mouth 2 (two) times daily.    . Multiple Vitamins-Minerals (MULTIVITAMIN WITH MINERALS) tablet Take 1 tablet by mouth daily.      . ranitidine (ZANTAC) 150 MG tablet Take 150 mg by mouth daily.      Results for orders placed or performed during the hospital encounter of 07/11/15 (from the past 48 hour(s))  CBC WITH DIFFERENTIAL     Status: None   Collection Time: 07/11/15  8:45 AM  Result Value Ref Range   WBC 5.5 4.0 - 10.5 K/uL   RBC 4.29 3.87 - 5.11 MIL/uL   Hemoglobin 12.6 12.0 - 15.0 g/dL   HCT 38.0 36.0 - 46.0 %   MCV 88.6 78.0 - 100.0 fL   MCH 29.4 26.0 - 34.0 pg   MCHC 33.2 30.0 - 36.0 g/dL   RDW 13.2 11.5 - 15.5 %   Platelets 214 150 - 400 K/uL   Neutrophils Relative % 41 %   Neutro Abs 2.3 1.7 - 7.7 K/uL   Lymphocytes Relative 49 %   Lymphs Abs 2.7 0.7 - 4.0 K/uL   Monocytes Relative 7 %   Monocytes Absolute 0.4 0.1 - 1.0 K/uL   Eosinophils Relative 3 %   Eosinophils Absolute 0.2 0.0 - 0.7 K/uL   Basophils Relative 0 %   Basophils Absolute 0.0 0.0 - 0.1 K/uL  Comprehensive metabolic panel     Status: Abnormal   Collection Time: 07/11/15  8:45 AM  Result Value Ref Range   Sodium 138 135 - 145 mmol/L   Potassium 4.2 3.5 - 5.1 mmol/L   Chloride 104 101 - 111 mmol/L   CO2 28 22 - 32 mmol/L   Glucose, Bld 96 65 - 99 mg/dL   BUN 27 (H) 6 - 20 mg/dL   Creatinine, Ser 0.70 0.44 - 1.00 mg/dL   Calcium 9.0 8.9 - 10.3 mg/dL   Total Protein 6.7 6.5 - 8.1 g/dL   Albumin 3.9 3.5 - 5.0 g/dL   AST 24 15 - 41 U/L   ALT 26 14 - 54 U/L   Alkaline Phosphatase 96 38 - 126 U/L   Total Bilirubin 0.6 0.3 - 1.2 mg/dL   GFR calc non Af Amer >60 >60 mL/min   GFR calc Af Amer >60 >60 mL/min  Comment: (NOTE) The eGFR has been calculated using the CKD EPI equation. This  calculation has not been validated in all clinical situations. eGFR's persistently <60 mL/min signify possible Chronic Kidney Disease.    Anion gap 6 5 - 15   No results found.  Review of Systems  Constitutional: Negative for weight loss.  HENT: Negative for nosebleeds.   Eyes: Negative for blurred vision.  Respiratory: Negative for shortness of breath.   Cardiovascular: Negative for chest pain, palpitations, orthopnea and PND.       Denies DOE  Gastrointestinal: Positive for nausea and abdominal pain.  Genitourinary: Negative for dysuria and hematuria.  Musculoskeletal: Negative.   Skin: Negative for itching and rash.  Neurological: Negative for dizziness, focal weakness, seizures, loss of consciousness and headaches.       Denies TIAs, amaurosis fugax  Endo/Heme/Allergies: Does not bruise/bleed easily.  Psychiatric/Behavioral: The patient is not nervous/anxious.     Blood pressure 116/73, pulse 76, temperature 98.3 F (36.8 C), temperature source Oral, resp. rate 16, SpO2 99 %. Physical Exam  Vitals reviewed. Constitutional: She is oriented to person, place, and time. She appears well-developed and well-nourished. No distress.  HENT:  Head: Normocephalic and atraumatic.  Right Ear: External ear normal.  Left Ear: External ear normal.  Eyes: Conjunctivae are normal. No scleral icterus.  Neck: Normal range of motion. Neck supple. No tracheal deviation present. No thyromegaly present.  Cardiovascular: Normal rate and normal heart sounds.   Respiratory: Effort normal and breath sounds normal. No stridor. No respiratory distress. She has no wheezes.  GI: Soft. She exhibits no distension. There is no tenderness. There is no rebound.  Musculoskeletal: She exhibits no edema or tenderness.  Neurological: She is alert and oriented to person, place, and time. She exhibits normal muscle tone.  Skin: Skin is warm and dry. No rash noted. She is not diaphoretic. No erythema. No pallor.   Psychiatric: She has a normal mood and affect. Her behavior is normal. Judgment and thought content normal.     Assessment/Plan Symptomatic cholelithiasis H/o Lap sleeve gastrectomy with HHR  Here for lap chole  All questions asked and answered.  Iv abx on call  Leighton Ruff. Redmond Pulling, MD, [place], Bariatric, & Minimally Invasive Surgery [facility], [place]   [name], MD 07/12/2015, 1:37 PM

## 2015-07-12 NOTE — Transfer of Care (Signed)
Immediate Anesthesia Transfer of Care Note  Patient: Taylor Silva  Procedure(s) Performed: Procedure(s): LAPAROSCOPIC CHOLECYSTECTOMY WITH INTRAOPERATIVE CHOLANGIOGRAM (N/A)  Patient Location: PACU  Anesthesia Type:General  Level of Consciousness: awake, alert , oriented and patient cooperative  Airway & Oxygen Therapy: Patient Spontanous Breathing and Patient connected to face mask oxygen  Post-op Assessment: Report given to RN, Post -op Vital signs reviewed and stable and Patient moving all extremities X 4  Post vital signs: stable  Last Vitals:  Filed Vitals:   07/12/15 1147  BP: 116/73  Pulse: 76  Temp: 36.8 C  Resp: 16    Last Pain: There were no vitals filed for this visit.       Complications: No apparent anesthesia complications

## 2015-07-12 NOTE — Op Note (Signed)
Taylor Silva [id number] [birth year] 07/12/2015  Laparoscopic Cholecystectomy with IOC Procedure Note  Indications: This patient presents with symptomatic gallbladder disease and will undergo laparoscopic cholecystectomy. She had undergone laparoscopic sleeve gastrectomy with hiatal hernia repair several months ago and developed RUQ pain. Workup revealed gallstones.   Pre-operative Diagnosis: symptomatic cholelithiasis  Post-operative Diagnosis: Same  Surgeon: [name]   Assistants: Alphonsa Overall MD FACS  Anesthesia: General endotracheal anesthesia  Procedure Details  The patient was seen again in the Holding Room. The risks, benefits, complications, treatment options, and expected outcomes were discussed with the patient. The possibilities of reaction to medication, pulmonary aspiration, perforation of viscus, bleeding, recurrent infection, finding a normal gallbladder, the need for additional procedures, failure to diagnose a condition, the possible need to convert to an open procedure, and creating a complication requiring transfusion or operation were discussed with the patient. The likelihood of improving the patient's symptoms with return to their baseline status is good.  The patient and/or family concurred with the proposed plan, giving informed consent. The site of surgery properly noted. The patient was taken to Operating Room, identified as [facility] and the procedure verified as Laparoscopic Cholecystectomy with Intraoperative Cholangiogram. A Time Out was held and the above information confirmed. Antibiotic prophylaxis was administered.   Prior to the induction of general anesthesia, antibiotic prophylaxis was administered. General endotracheal anesthesia was then administered and tolerated well. After the induction, the abdomen was prepped with Chloraprep and draped in the sterile fashion. The patient was positioned in the supine position.  Local anesthetic agent was  injected into the skin near the umbilicus and an incision made. We dissected down to the abdominal fascia with blunt dissection.  The fascia was incised vertically and we entered the peritoneal cavity bluntly.  A pursestring suture of 0-Vicryl was placed around the fascial opening.  The Hasson cannula was inserted and secured with the stay suture.  Pneumoperitoneum was then created with CO2 and tolerated well without any adverse changes in the patient's vital signs. An 5-mm port was placed in the subxiphoid position.  Two 5-mm ports were placed in the right upper quadrant. All skin incisions were infiltrated with a local anesthetic agent before making the incision and placing the trocars.   We positioned the patient in reverse Trendelenburg, tilted slightly to the patient's left.  The gallbladder was identified, the fundus grasped and retracted cephalad. Adhesions were lysed bluntly and with the electrocautery where indicated, taking care not to injure any adjacent organs or viscus. The infundibulum was grasped and retracted laterally, exposing the peritoneum overlying the triangle of Calot. This was then divided and exposed in a blunt fashion. A critical view of the cystic duct and cystic artery was obtained.  The cystic duct was clearly identified and bluntly dissected circumferentially. The cystic duct was ligated with a clip distally.   An incision was made in the cystic duct and the [facility] cholangiogram catheter introduced. The catheter was secured using a clip. A cholangiogram was then obtained which showed good visualization of the distal and proximal biliary tree with no sign of filling defects or obstruction.  Contrast flowed easily into the duodenum. It appeared that the cystic duct inserted into the right hepatic duct. The catheter was then removed.   The cystic duct was then ligated with clips and divided. The cystic artery which had been identified & dissected free was ligated with clips and divided  as well.   The gallbladder was dissected from the liver  bed in retrograde fashion with the electrocautery. There was some spillage of bile from the gallbladder as it was mobilized. The gallbladder was removed and placed in an Ecco sac.  The gallbladder and Ecco sac were then removed through the umbilical port site. The liver bed was irrigated and inspected. Hemostasis was achieved with the electrocautery. Copious irrigation was utilized and was repeatedly aspirated until clear. We briefly inspected the gastric sleeve. There was some adhesions along the resected line of the stomach but otherwise appeared normal.  The pursestring suture was used to close the umbilical fascia.    We again inspected the right upper quadrant for hemostasis.  The umbilical closure was inspected and there was no air leak and nothing trapped within the closure. Pneumoperitoneum was released as we removed the trocars.  4-0 Monocryl was used to close the skin.   Benzoin, steri-strips, and clean dressings were applied. The patient was then extubated and brought to the recovery room in stable condition. Instrument, sponge, and needle counts were correct at closure and at the conclusion of the case.   Findings: Chronic calculous cholecystitis; appeared cystic duct inserted on right hepatic duct  Estimated Blood Loss: Minimal         Drains: none         Specimens: Gallbladder           Complications: None; patient tolerated the procedure well.         Disposition: PACU - hemodynamically stable.         Condition: stable  Leighton Ruff. Redmond Pulling, MD, FACS General, Bariatric, & Minimally Invasive Surgery [facility], [place]

## 2015-07-12 NOTE — Anesthesia Procedure Notes (Signed)
Procedure Name: Intubation Date/Time: 07/12/2015 2:17 PM Performed by: Carleene Cooper A Pre-anesthesia Checklist: Patient identified, Emergency Drugs available, Suction available, Patient being monitored and Timeout performed Patient Re-evaluated:Patient Re-evaluated prior to inductionOxygen Delivery Method: Circle system utilized Preoxygenation: Pre-oxygenation with 100% oxygen Intubation Type: IV induction Ventilation: Mask ventilation without difficulty Laryngoscope Size: Mac and 3 Grade View: Grade I Tube type: Oral Tube size: 7.5 mm Number of attempts: 1 Airway Equipment and Method: Stylet Placement Confirmation: ETT inserted through vocal cords under direct vision,  positive ETCO2 and breath sounds checked- equal and bilateral Secured at: 23 cm Tube secured with: Tape Dental Injury: Teeth and Oropharynx as per pre-operative assessment

## 2015-07-12 NOTE — Anesthesia Preprocedure Evaluation (Addendum)
Anesthesia Evaluation    Reviewed: Allergy & Precautions, NPO status , Patient's Chart, lab work & pertinent test results  History of Anesthesia Complications (+) PONV and history of anesthetic complications  Airway Mallampati: II  TM Distance: >3 FB Neck ROM: Full    Dental no notable dental hx.    Pulmonary asthma , sleep apnea ,    Pulmonary exam normal breath sounds clear to auscultation       Cardiovascular hypertension, Pt. on medications Normal cardiovascular exam+ Valvular Problems/Murmurs MVP  Rhythm:Regular Rate:Normal     Neuro/Psych PSYCHIATRIC DISORDERS Depression negative neurological ROS     GI/Hepatic Neg liver ROS, hiatal hernia,   Endo/Other  Morbid obesity  Renal/GU negative Renal ROS  negative genitourinary   Musculoskeletal negative musculoskeletal ROS (+)   Abdominal   Peds negative pediatric ROS (+)  Hematology negative hematology ROS (+)   Anesthesia Other Findings   Reproductive/Obstetrics negative OB ROS                           Anesthesia Physical  Anesthesia Plan  ASA: III  Anesthesia Plan: General   Post-op Pain Management:    Induction: Intravenous  Airway Management Planned: Oral ETT  Additional Equipment:   Intra-op Plan:   Post-operative Plan: Extubation in OR  Informed Consent:   Plan Discussed with: CRNA  Anesthesia Plan Comments:         Anesthesia Quick Evaluation

## 2015-07-12 NOTE — Discharge Instructions (Signed)
[facility] °LAPAROSCOPIC SURGERY: POST OP INSTRUCTIONS °Always review your discharge instruction sheet given to you by the facility where your surgery was performed. °IF YOU HAVE DISABILITY OR FAMILY LEAVE FORMS, YOU MUST BRING THEM TO THE OFFICE FOR PROCESSING.   °DO NOT GIVE THEM TO YOUR DOCTOR. ° °1. A prescription for pain medication may be given to you upon discharge.  Take your pain medication as prescribed, if needed.  If narcotic pain medicine is not needed, then you may take acetaminophen (Tylenol) &/ or ibuprofen (Advil) as needed. °2. Take your usually prescribed medications unless otherwise directed. °3. If you need a refill on your pain medication, please contact your pharmacy.  They will contact our office to request authorization. Prescriptions will not be filled after 5pm or on week-ends. °4. You should follow a light diet the first few days after arrival home, such as soup and crackers, etc.  Be sure to include lots of fluids daily. °5. Most patients will experience some swelling and bruising in the area of the incisions.  Ice packs will help.  Swelling and bruising can take several days to resolve.  °6. It is common to experience some constipation if taking pain medication after surgery.  Increasing fluid intake and taking a stool softener (such as Colace) will usually help or prevent this problem from occurring.  A mild laxative (Milk of Magnesia or Miralax) should be taken according to package instructions if there are no bowel movements after 48 hours. °7. Unless discharge instructions indicate otherwise, you may remove your bandages 48 hours after surgery, and you may shower at that time.  You  have steri-strips (small skin tapes) in place directly over the incision.  These strips should be left on the skin for 7-10 days.  °8. ACTIVITIES:  You may resume regular (light) daily activities beginning the next day--such as daily self-care, walking, climbing stairs--gradually  increasing activities as tolerated.  You may have sexual intercourse when it is comfortable.  Refrain from any heavy lifting or straining until approved by your doctor. °a. You may drive when you are no longer taking prescription pain medication, you can comfortably wear a seatbelt, and you can safely maneuver your car and apply brakes. °9. You should see your doctor in the office for a follow-up appointment approximately 2-3 weeks after your surgery.  Make sure that you call for this appointment within a day or two after you arrive home to insure a convenient appointment time. °10. OTHER INSTRUCTIONS:  °WHEN TO CALL YOUR DOCTOR: °1. Fever over 101.0 °2. Inability to urinate °3. Continued bleeding from incision. °4. Increased pain, redness, or drainage from the incision. °5. Increasing abdominal pain ° °The clinic staff is available to answer your questions during regular business hours.  Please don’t hesitate to call and ask to speak to one of the nurses for clinical concerns.  If you have a medical emergency, go to the nearest emergency room or call 911.  A surgeon from [facility] is always on call at the hospital. °[address], [address], [place], [place]  [address] ? [address] [address], [place], [place]   [address] °[phone] ? [phone] ? FAX [phone] °Web site: [email or web address] ° ° ° ° ° °

## 2015-07-12 NOTE — Anesthesia Postprocedure Evaluation (Signed)
Anesthesia Post Note  Patient: Taylor Silva  Procedure(s) Performed: Procedure(s) (LRB): LAPAROSCOPIC CHOLECYSTECTOMY WITH INTRAOPERATIVE CHOLANGIOGRAM (N/A)  Patient location during evaluation: PACU Anesthesia Type: General Level of consciousness: sedated Pain management: pain level controlled Vital Signs Assessment: post-procedure vital signs reviewed and stable Respiratory status: spontaneous breathing and respiratory function stable Cardiovascular status: stable Anesthetic complications: no    Last Vitals:  Filed Vitals:   07/12/15 1615 07/12/15 1645  BP: 126/77   Pulse: 63   Temp: 36.4 C 36.7 C  Resp: 14     Last Pain:  Filed Vitals:   07/12/15 1652  PainSc: 0-No pain                 Alveta Quintela DANIEL

## 2015-07-12 NOTE — Progress Notes (Signed)
Pt expressed desire to be discharged to home

## 2015-07-13 ENCOUNTER — Encounter (HOSPITAL_COMMUNITY): Payer: Self-pay | Admitting: General Surgery

## 2015-10-11 ENCOUNTER — Emergency Department (HOSPITAL_BASED_OUTPATIENT_CLINIC_OR_DEPARTMENT_OTHER): Payer: BLUE CROSS/BLUE SHIELD

## 2015-10-11 ENCOUNTER — Emergency Department (HOSPITAL_BASED_OUTPATIENT_CLINIC_OR_DEPARTMENT_OTHER)
Admission: EM | Admit: 2015-10-11 | Discharge: 2015-10-11 | Disposition: A | Discharge status: Home / Self Care | Payer: BLUE CROSS/BLUE SHIELD | Attending: Emergency Medicine | Admitting: Emergency Medicine

## 2015-10-11 ENCOUNTER — Encounter (HOSPITAL_BASED_OUTPATIENT_CLINIC_OR_DEPARTMENT_OTHER): Payer: Self-pay | Admitting: Emergency Medicine

## 2015-10-11 DIAGNOSIS — M545 Low back pain, unspecified: Secondary | ICD-10-CM

## 2015-10-11 DIAGNOSIS — I10 Essential (primary) hypertension: Secondary | ICD-10-CM | POA: Diagnosis not present

## 2015-10-11 DIAGNOSIS — J45909 Unspecified asthma, uncomplicated: Secondary | ICD-10-CM | POA: Insufficient documentation

## 2015-10-11 DIAGNOSIS — M542 Cervicalgia: Secondary | ICD-10-CM | POA: Insufficient documentation

## 2015-10-11 MED ORDER — KETOROLAC TROMETHAMINE 60 MG/2ML IM SOLN
60.0000 mg | Freq: Once | INTRAMUSCULAR | Status: AC
  Administered 2015-10-11: 60 mg via INTRAMUSCULAR
  Filled 2015-10-11: qty 2

## 2015-10-11 MED ORDER — METHOCARBAMOL 500 MG PO TABS: 500.0000 mg | ORAL_TABLET | Freq: Three times a day (TID) | ORAL | 0 refills | Status: DC | PRN

## 2015-10-11 MED ORDER — HYDROCODONE-ACETAMINOPHEN 5-325 MG PO TABS: 1.0000 | ORAL_TABLET | Freq: Four times a day (QID) | ORAL | 0 refills | Status: DC | PRN
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
## 2015-10-11 NOTE — ED Notes (Signed)
MD at bedside. 
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
## 2015-10-22 IMAGING — CR DG CHEST 2V
2 series · 2 of 2 positions shown · non-contrast
Comparison: 10/14/2013

CLINICAL DATA: Bariatric screening

EXAM:
CHEST  2 VIEW

[chest pa]
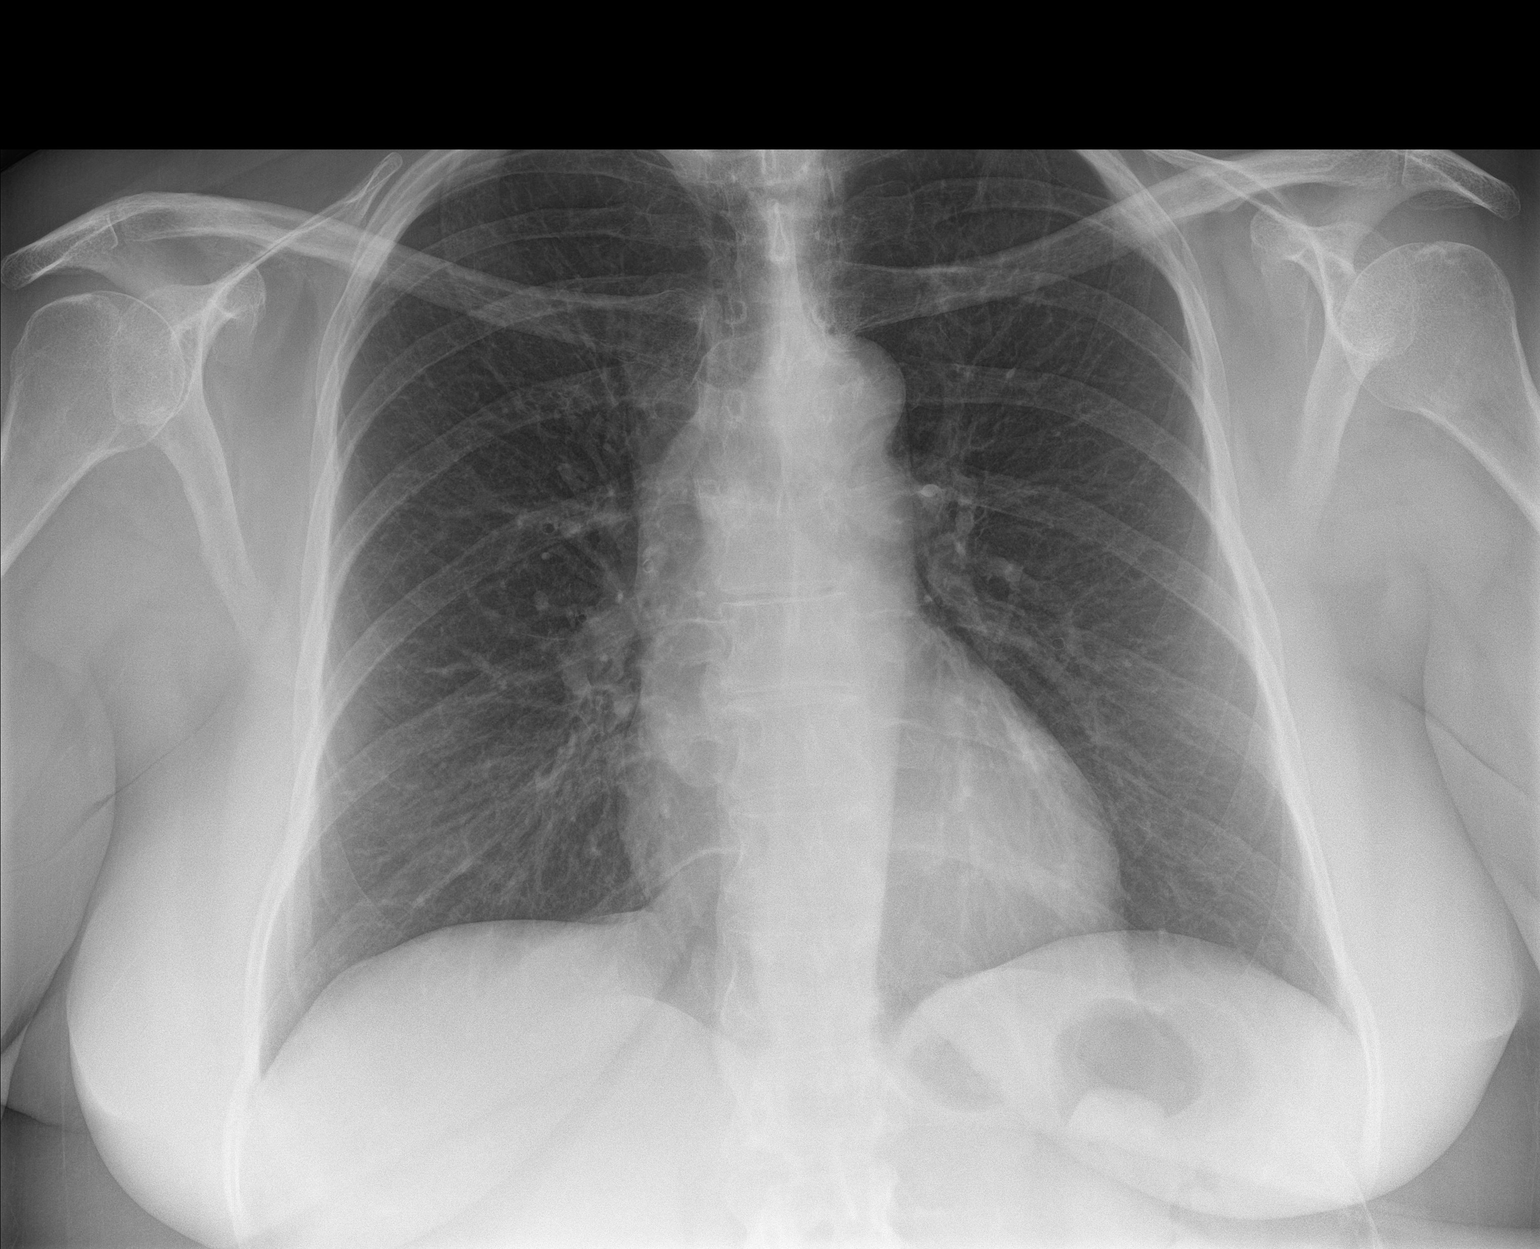

[chest lat]
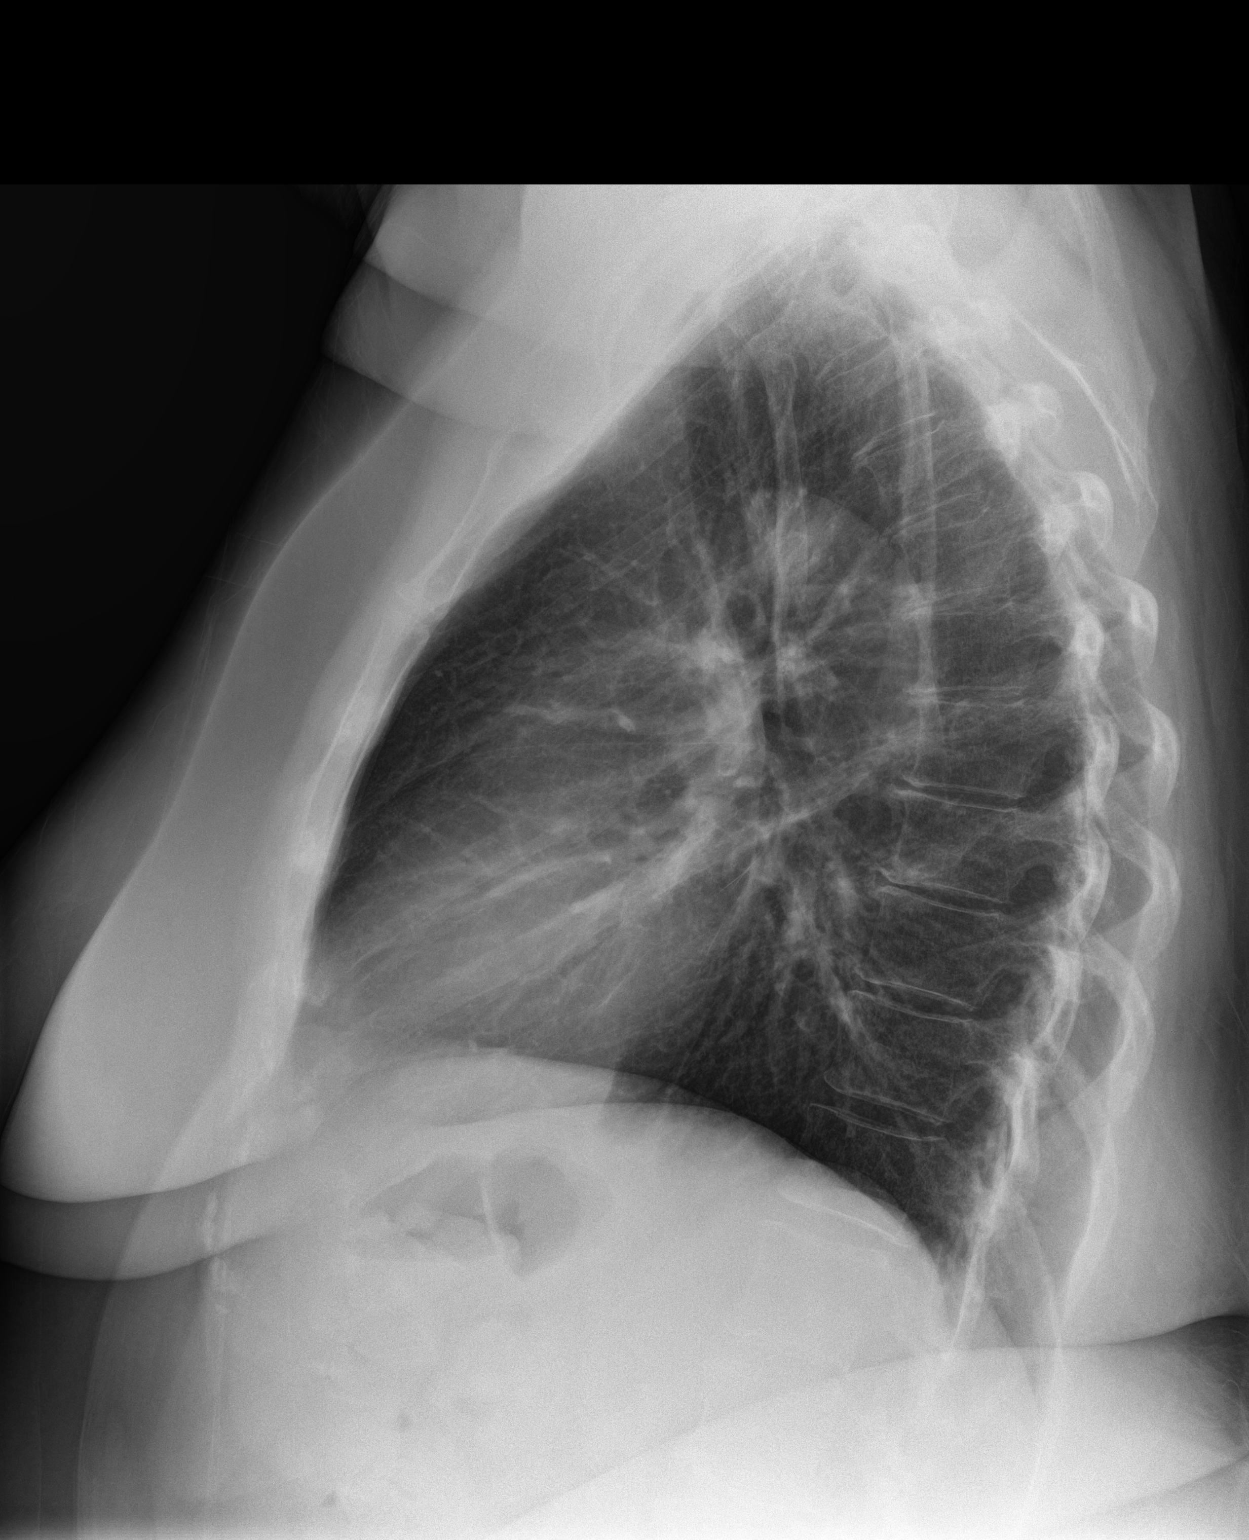

[2 of 2 positions shown; findings below may reference images not displayed]

FINDINGS: Lungs are clear.  No pleural effusion or pneumothorax.

The heart is normal in size.

Mild degenerative changes of the visualized thoracolumbar spine.
IMPRESSION: Normal chest radiographs.

## 2015-12-07 LAB — BASIC METABOLIC PANEL
BUN: 24 mg/dL — AB (ref 4–21)
Creatinine: 0.8 mg/dL (ref 0.5–1.1)
Glucose: 90 mg/dL
Potassium: 4.3 mmol/L (ref 3.4–5.3)
SODIUM: 141 mmol/L (ref 137–147)

## 2015-12-07 LAB — CBC AND DIFFERENTIAL
HEMATOCRIT: 41 % (ref 36–46)
Hemoglobin: 13.3 g/dL (ref 12.0–16.0)
NEUTROS ABS: 2183 /uL
PLATELETS: 231 10*3/uL (ref 150–399)
WBC: 5.9 10*3/mL

## 2015-12-07 LAB — HEPATIC FUNCTION PANEL
ALK PHOS: 88 U/L (ref 25–125)
ALT: 16 U/L (ref 7–35)
AST: 20 U/L (ref 13–35)
Bilirubin, Total: 0.6 mg/dL

## 2015-12-07 LAB — LIPID PANEL
CHOLESTEROL: 198 mg/dL (ref 0–200)
HDL: 61 mg/dL (ref 35–70)
LDL Cholesterol: 120 mg/dL
LDl/HDL Ratio: 3.2
Triglycerides: 83 mg/dL (ref 40–160)

## 2015-12-07 LAB — HEMOGLOBIN A1C: Hemoglobin A1C: 5.5

## 2015-12-21 ENCOUNTER — Encounter: Payer: Self-pay | Admitting: Internal Medicine

## 2016-02-03 IMAGING — RF DG UGI W/ GASTROGRAFIN
4 of 6 series · 14 of 24 positions shown · IV contrast (omnipaque)
Comparison: 06/22/2014

CLINICAL DATA: Postop day 1 gastric sleeve and hiatal hernia
repair. Nausea.

EXAM:
WATER SOLUBLE UPPER GI SERIES
TECHNIQUE: Single-column upper GI series was performed using water soluble
contrast.
CONTRAST:  50 mL Omnipaque p.o.

[Series 1: run · 11 of 21 slices shown (1 of 4)]
[im 1/21]
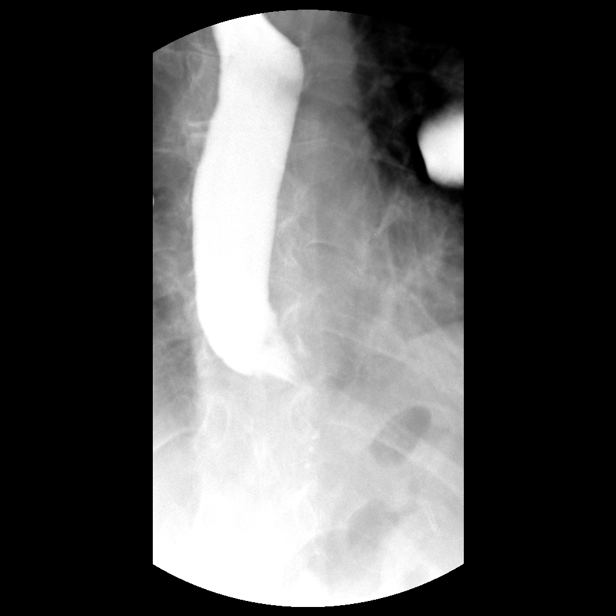
[im 3/21]
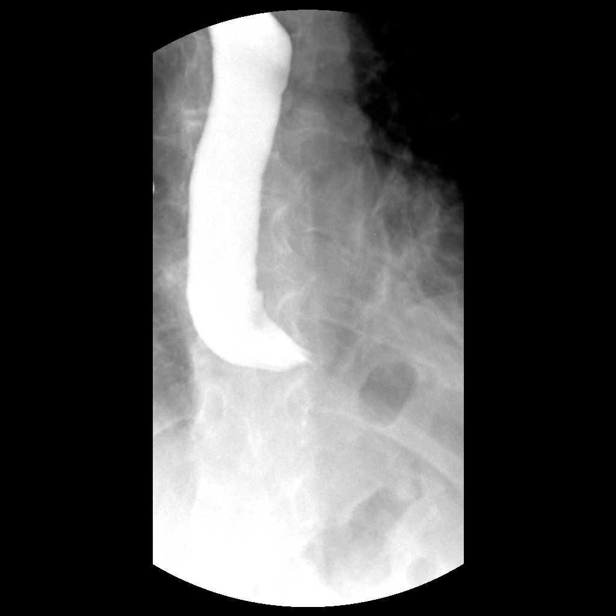
[im 5/21]
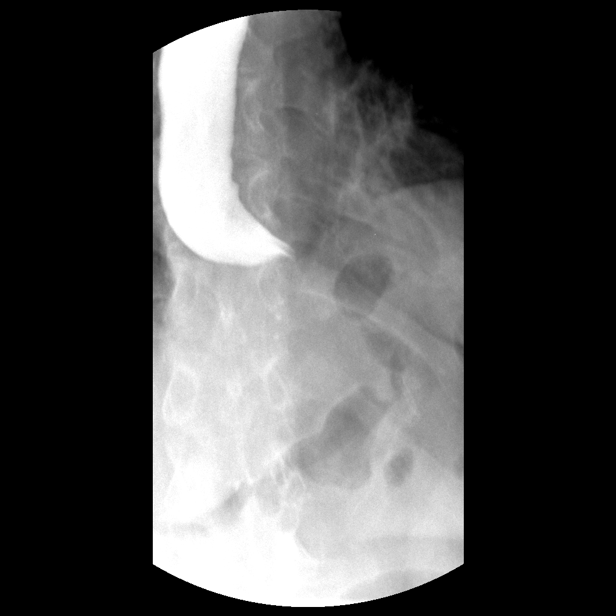
[im 7/21]
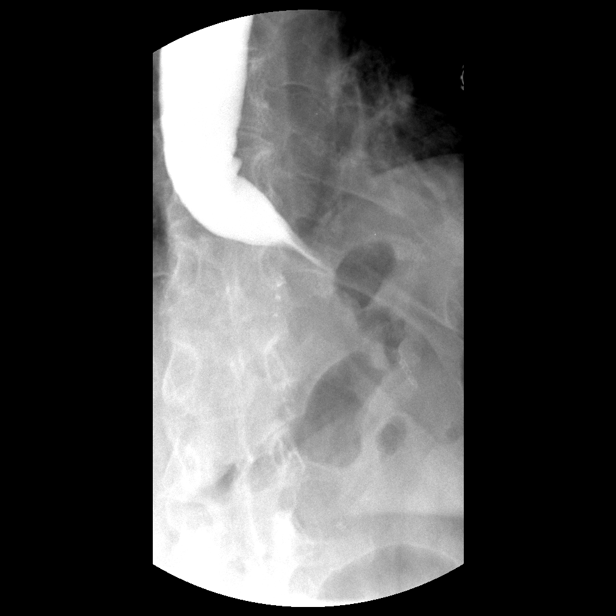
[im 8/21]
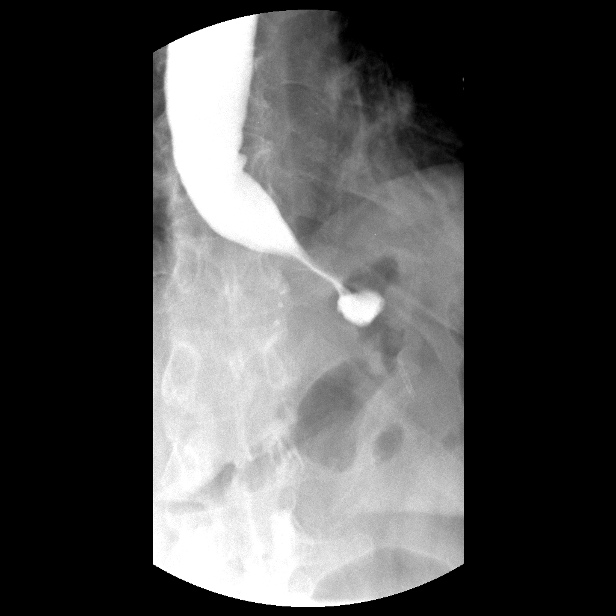
[im 11/21]
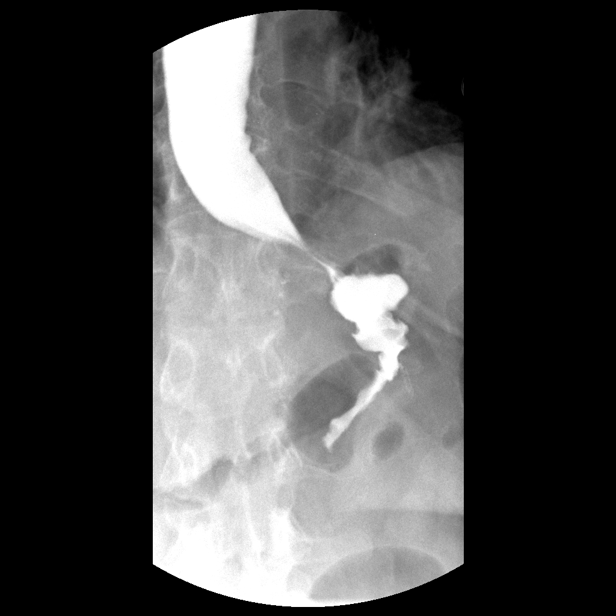
[im 13/21]
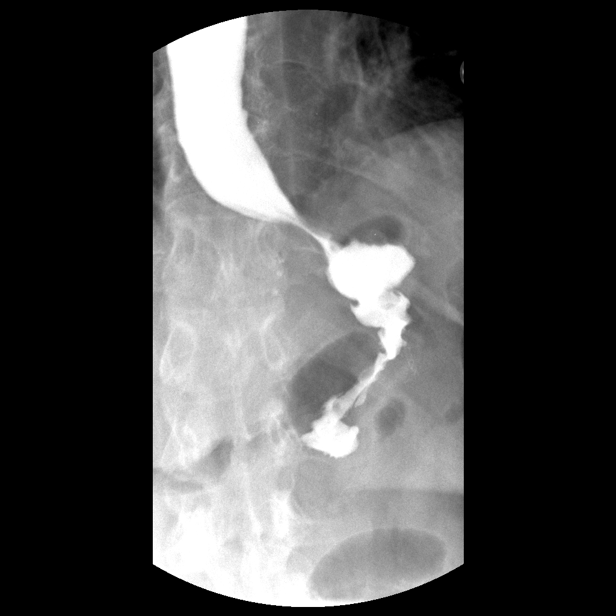
[im 14/21]
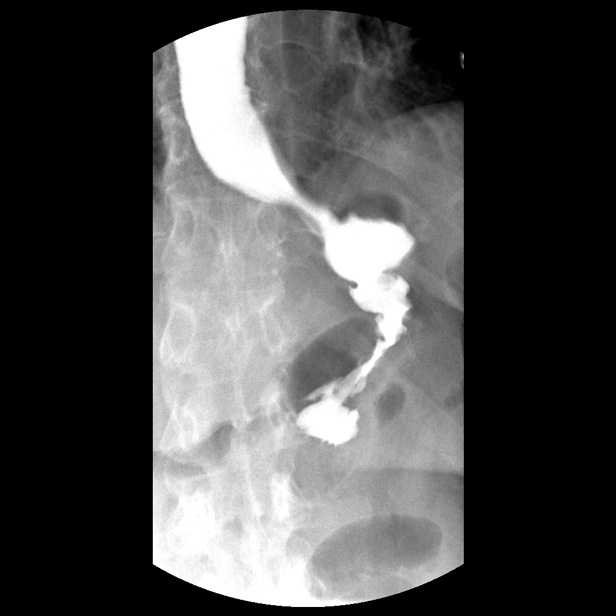
[im 16/21]
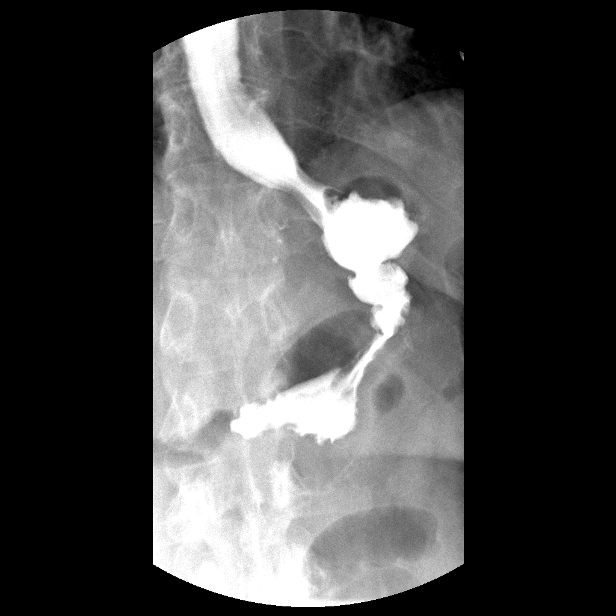
[im 18/21]
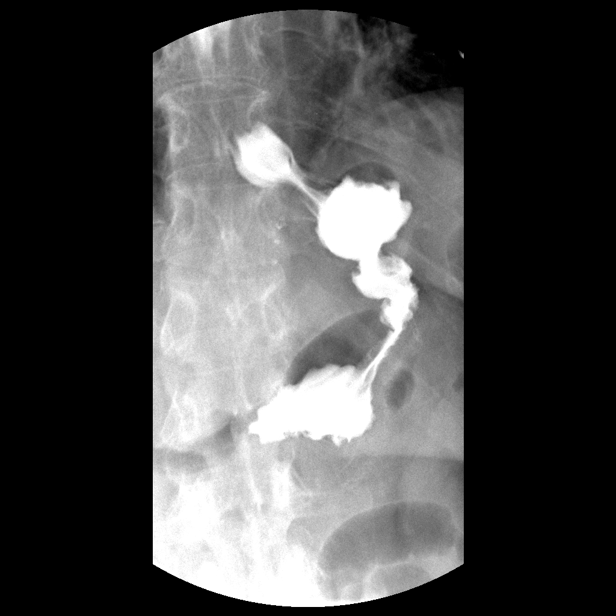
[im 21/21]
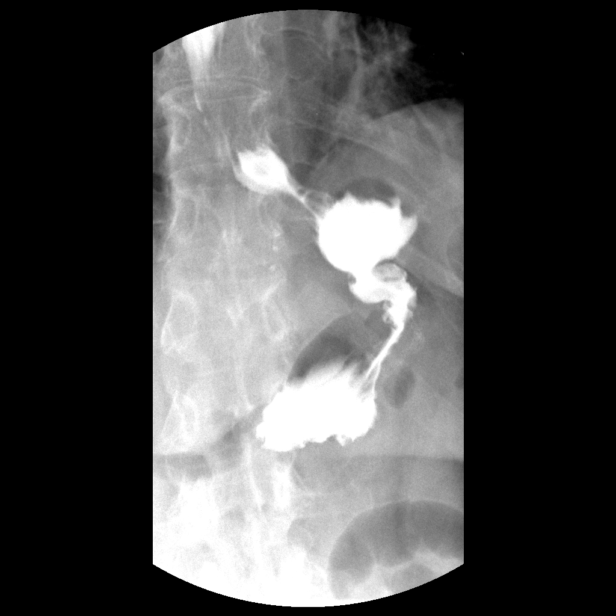

[Series 2: run · 1 of 1 slices shown (2 of 4)]
[im 1/1]
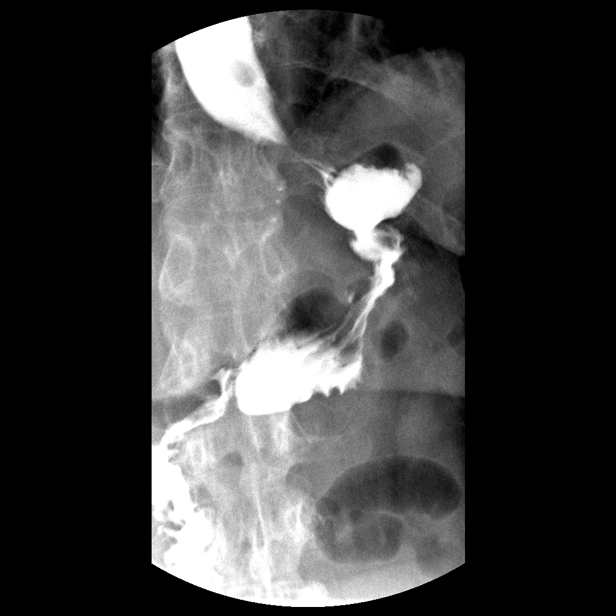

[Series 4: run · 1 of 1 slices shown (3 of 4)]
[im 1/1]
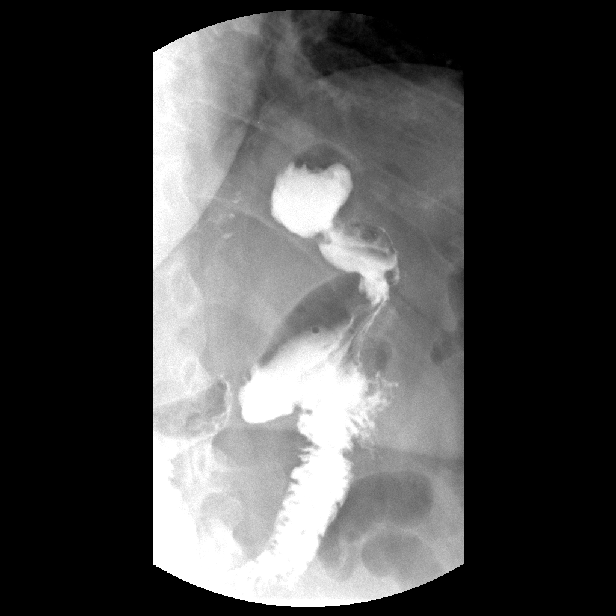

[Series 6: run · 1 of 1 slices shown (4 of 4)]
[im 1/1]
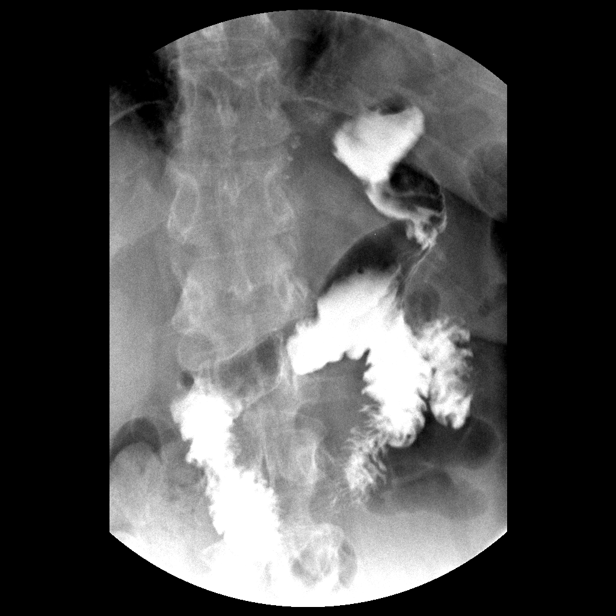

[14 of 24 positions shown; findings below may reference images not displayed]

FLUOROSCOPY TIME:  Radiation Exposure Index (as provided by the
fluoroscopic device):

If the device does not provide the exposure index:

Fluoroscopy Time (in minutes and seconds):  0 minutes 45 second

Number of Acquired Images:
FINDINGS: Preliminary KUB is negative for ileus or bowel obstruction. Stool in
the colon.

Esophageal motility and mucosa are normal. The gastroesophageal
junction is patent. No hiatal hernia. Postop gastric sleeve in
satisfactory appearance. No leak or stricture. There is prompt
emptying of the stomach into the duodenum which appears normal. No
mucosal irregularity.
IMPRESSION: Negative for leak or obstruction.  Negative for hiatal hernia.

## 2016-02-04 ENCOUNTER — Encounter: Payer: Self-pay | Admitting: Family Medicine

## 2016-02-04 ENCOUNTER — Ambulatory Visit (INDEPENDENT_AMBULATORY_CARE_PROVIDER_SITE_OTHER): Payer: BLUE CROSS/BLUE SHIELD | Admitting: Family Medicine

## 2016-02-04 VITALS — BP 110/88 | HR 106 | Temp 98.9°F | Resp 12 | Ht 66.0 in | Wt 220.0 lb

## 2016-02-04 DIAGNOSIS — J069 Acute upper respiratory infection, unspecified: Secondary | ICD-10-CM | POA: Diagnosis not present

## 2016-02-04 DIAGNOSIS — J309 Allergic rhinitis, unspecified: Secondary | ICD-10-CM | POA: Diagnosis not present

## 2016-02-04 DIAGNOSIS — Z20828 Contact with and (suspected) exposure to other viral communicable diseases: Secondary | ICD-10-CM | POA: Diagnosis not present

## 2016-02-04 DIAGNOSIS — J45909 Unspecified asthma, uncomplicated: Secondary | ICD-10-CM | POA: Insufficient documentation

## 2016-02-04 DIAGNOSIS — J4520 Mild intermittent asthma, uncomplicated: Secondary | ICD-10-CM

## 2016-02-04 DIAGNOSIS — R509 Fever, unspecified: Secondary | ICD-10-CM

## 2016-02-04 DIAGNOSIS — R053 Chronic cough: Secondary | ICD-10-CM

## 2016-02-04 DIAGNOSIS — R05 Cough: Secondary | ICD-10-CM

## 2016-02-04 MED ORDER — BENZONATATE 100 MG PO CAPS: 200.0000 mg | ORAL_CAPSULE | Freq: Two times a day (BID) | ORAL | 0 refills | Status: AC | PRN

## 2016-02-04 MED ORDER — OSELTAMIVIR PHOSPHATE 75 MG PO CAPS: 75.0000 mg | ORAL_CAPSULE | Freq: Two times a day (BID) | ORAL | 0 refills | Status: AC

## 2016-02-04 MED ORDER — ALBUTEROL SULFATE HFA 108 (90 BASE) MCG/ACT IN AERS: 2.0000 | INHALATION_SPRAY | Freq: Four times a day (QID) | RESPIRATORY_TRACT | 0 refills | Status: DC | PRN

## 2016-02-04 NOTE — Progress Notes (Signed)
HPI:  ACUTE VISIT  Chief Complaint  Patient presents with  . possible flu    [name] is a 61 y.o.female here today complaining of 2-3 days of acute respiratory symptoms.  She is also complaining of 6 weeks of productive and nonproductive cough, which is worse at night and keeping her from sleep. She has history of allergic rhinitis and asthma. She is reporting intermittent wheezing for the past 6 weeks. She has not identified exacerbating or alleviating factors.  It seems to be getting worse.  She also has Hx of GERD.   -2-3 days ago she started with worsening nasal congestionand rhinorrhea. 2-3 days of sore throat and post nasal drainage. + Fever (100.2 F) ,chills, and body aches.  She has not noted chest pain or dyspnea. Last night she had an episode of diarrhea, nausea, and vomiting. She has not had any today. She denies abdominal pain and blood in the stools.  No Hx of recent travel. + Sick contact, according to patient her son was diagnosed with influenza 2 weeks ago, her husband was also sick recently and he was treated empirically for influenza by PCP. No known insect bite.   OTC medications for this problem: Tylenol.  Symptoms otherwise stable.    Review of Systems  Constitutional: Positive for appetite change, chills, fatigue and fever.  HENT: Positive for congestion, postnasal drip, rhinorrhea, sinus pressure and sore throat. Negative for ear pain, mouth sores, nosebleeds, trouble swallowing and voice change.   Eyes: Negative for discharge, redness and itching.  Respiratory: Positive for cough and wheezing. Negative for shortness of breath.   Cardiovascular: Negative for chest pain.  Gastrointestinal: Positive for diarrhea, nausea and vomiting. Negative for abdominal pain and blood in stool.  Musculoskeletal: Positive for myalgias. Negative for gait problem and neck pain.  Skin: Negative for rash.  Allergic/Immunologic: Positive for  environmental allergies.  Neurological: Positive for headaches (frontal pressure). Negative for syncope and weakness.  Hematological: Negative for adenopathy. Does not bruise/bleed easily.  Psychiatric/Behavioral: Negative for confusion. The patient is nervous/anxious.       Current Outpatient Prescriptions on File Prior to Visit  Medication Sig Dispense Refill  . Calcium-Vitamin D 600-200 MG-UNIT per tablet Take 2 tablets by mouth daily.    Marland Kitchen gabapentin (NEURONTIN) 600 MG tablet Take 600 mg by mouth 2 (two) times daily.    . Multiple Vitamins-Minerals (MULTIVITAMIN WITH MINERALS) tablet Take 2 tablets by mouth daily.     . ranitidine (ZANTAC) 150 MG tablet Take 150 mg by mouth daily.    . [DISCONTINUED] estrogens, conjugated, (PREMARIN) 0.625 MG tablet Take 0.625 mg by mouth daily. Take daily for 21 days then do not take for 7 days.     . [DISCONTINUED] fexofenadine (ALLEGRA) 180 MG tablet Take 180 mg by mouth daily.      . [DISCONTINUED] topiramate (TOPAMAX) 100 MG tablet Take 100 mg by mouth at bedtime.       No current facility-administered medications on file prior to visit.      Past Medical History:  Diagnosis Date  . Arthritis   . Asthma   . Depression   . DJD (degenerative joint disease), lumbar   . Fructose intolerance   . GERD (gastroesophageal reflux disease)   . H/O hiatal hernia   . Hard of hearing    bilat   . Headache    migraines  . Heart murmur    echo done 80's neg  . History  of bronchitis   . History of frequent urinary tract infections   . History of vertigo   . Hyperlipidemia   . Hypertension    currently taking no medications   . Obesity   . Pinched nerve    in back  . PONV (postoperative nausea and vomiting)    patch used last time  . Pre-diabetes   . Restless legs   . Seasonal allergies   . Sleep apnea    Intolerant to CPAP last sleep study  . Tinnitus   . Wears glasses    Allergies  Allergen Reactions  . Codeine Anaphylaxis  .  Erythromycin Hives  . Tetracyclines & Related Hives  . Darvon [Propoxyphene] Photosensitivity    Blurry vision for 3 days  . Morphine And Related Dermatitis    "burning in veins"  . Other Other (See Comments)    Tomatoes upset stomach    . Penicillins Hives    Has patient had a PCN reaction causing immediate rash, facial/tongue/throat swelling, SOB or lightheadedness with hypotension: Yes Has patient had a PCN reaction causing severe rash involving mucus membranes or skin necrosis: No Has patient had a PCN reaction that required hospitalization No Has patient had a PCN reaction occurring within the last 10 years: No If all of the above answers are "NO", then may proceed with Cephalosporin use.   . Sulfa Antibiotics Nausea And Vomiting    Social History   Social History  . Marital status: Married    Spouse name: N/A  . Number of children: 1  . Years of education: N/A   Occupational History  . 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 Smoking status: Never Smoker  . Smokeless tobacco: Never Used  . Alcohol use No  . Drug use: No  . Sexual activity: Not Asked   Other Topics Concern  . None   Social History Narrative   Lives with husband who is transplant patient   Completed some college                Vitals:   02/04/16 1103  BP: 110/88  Pulse: (!) 106  Resp: 12  Temp: 98.9 F (37.2 C)   O2 sat 98% at RA. Body mass index is 35.51 kg/m.   Physical Exam  Nursing note and vitals reviewed. Constitutional: She is oriented to person, place, and time. She appears well-developed. She does not appear ill. No distress.  HENT:  Head: Atraumatic.  Right Ear: Tympanic membrane, external ear and ear canal normal.  Left Ear: Tympanic membrane, external ear and ear canal normal.  Nose: Rhinorrhea present. Right sinus exhibits no maxillary sinus tenderness and no frontal sinus tenderness. Left sinus exhibits no maxillary sinus tenderness  and no frontal sinus tenderness.  Mouth/Throat: Uvula is midline and mucous membranes are normal. Posterior oropharyngeal erythema (mild) present. No oropharyngeal exudate or posterior oropharyngeal edema.  Nasal voice. Post nasal drainage.  Eyes: Conjunctivae are normal.  Cardiovascular: Regular rhythm.  Tachycardia present.   No murmur heard. Respiratory: Effort normal and breath sounds normal. No stridor. No respiratory distress.  Lymphadenopathy:       Head (right side): No submandibular adenopathy present.       Head (left side): No submandibular adenopathy present.    She has cervical adenopathy.       Right cervical: Posterior cervical adenopathy present.       Left cervical: Posterior cervical adenopathy present.  Neurological: She is alert  and oriented to person, place, and time. She has normal strength. Coordination and gait normal.  Skin: Skin is warm. No rash noted. No erythema.  Psychiatric: Her speech is normal. Her mood appears anxious.  Well groomed, good eye contact.      ASSESSMENT AND PLAN:     Taylor Silva was seen today for possible flu.  Diagnoses and all orders for this visit:  Fever, unspecified fever cause -     POCT Influenza A/B  URI, acute -     benzonatate (TESSALON) 100 MG capsule; Take 2 capsules (200 mg total) by mouth 2 (two) times daily as needed for cough.  Exposure to influenza   Symptoms suggests a viral etiology, I explained  that symptomatic treatment is usually recommended. Because exposure to influenza and even though rapid flu test was negative I recommended treatment with Teraflu. Risk vs benefits discussed and she agrees with plan.  Instructed to monitor for signs of complications, clearly instructed about warning signs. I also explained that cough and nasal congestion can last a few days and sometimes weeks. F/U as needed.   -     oseltamivir (TAMIFLU) 75 MG capsule; Take 1 capsule (75 mg total) by mouth 2 (two) times  daily.  Mild intermittent asthma without complication  Today I did not appreciate wheezing. Albuterol inh 2 puff every 6 hours for a week then as needed for wheezing or shortness of breath.  I do not think oral steroids are needed at this time.  -     albuterol (PROVENTIL HFA;VENTOLIN HFA) 108 (90 Base) MCG/ACT inhaler; Inhale 2 puffs into the lungs every 6 (six) hours as needed for wheezing or shortness of breath.  Chronic allergic rhinitis, unspecified seasonality, unspecified trigger  Could also be contributing to persistent cough. Recommend OTC antihistaminic and intra nasal steroid.  Persistent cough  Possible causes discussed: GERD,allergies, asthma among some. Zantac at bedtime recommended. Further recommendations will be given according to CXR.   -     DG Chest 2 View; Future -     benzonatate (TESSALON) 100 MG capsule; Take 2 capsules (200 mg total) by mouth 2 (two) times daily as needed for cough.      -Ms. Taylor Silva was advised to return or notify a doctor immediately if symptoms worsen or persist or new concerns arise.       Betty G. Martinique, MD  [facility]. [place] office.

## 2016-02-04 NOTE — Progress Notes (Signed)
Pre visit review using our clinic review tool, if applicable. No additional management support is needed unless otherwise documented below in the visit note. 

## 2016-02-04 NOTE — Patient Instructions (Addendum)
  Taylor Silva I have seen you today for an acute visit.  A few things to remember from today's visit:   Fever, unspecified fever cause - Plan: POCT Influenza A/B  URI, acute - Plan: benzonatate (TESSALON) 100 MG capsule  Exposure to influenza  Chronic allergic rhinitis, unspecified seasonality, unspecified trigger  Persistent cough - Plan: DG Chest 2 View, benzonatate (TESSALON) 100 MG capsule  Mild intermittent asthma without complication - Plan: albuterol (PROVENTIL HFA;VENTOLIN HFA) 108 (90 Base) MCG/ACT inhaler     viral infections are self-limited and we treat each symptom depending of severity.  Because Flu exposure Tamiflu was recommended to start as soon as possible.  Flu test done here today was negative.  Over the counter medications as decongestants and cold medications usually help, they need to be taken with caution if there is a history of high blood pressure or palpitations. Tylenol and/or Ibuprofen also helps with most symptoms (headache, muscle aching, fever,etc) Plenty of fluids. Honey helps with cough. Steam inhalations helps with runny nose, nasal congestion, and may prevent sinus infections. Cough and nasal congestion could last a few days and sometimes weeks. Please follow in not any better in 1-2 weeks or if symptoms get worse.  -The other symptoms, those that you reporting having x 6 weeks: ? Allergies, GERD. Chest X ray recommended.  -Albuterol inh 2 puff every 6 hours for a week then as needed for wheezing or shortness of breath.    -Medications prescribed today are intended for short period of time and will not be refill upon request, a follow up appointment might be necessary to discuss continuation of of treatment if appropriate.   In general please monitor for signs of worsening symptoms and seek immediate medical attention if any concerning.  If symptoms are not resolved in a few 1-2 weeks you should schedule a follow up appointment  with your doctor, before if needed.  Please be sure you have an appointment already scheduled with your PCP before you leave today.

## 2016-02-05 LAB — POCT INFLUENZA A/B
Influenza A, POC: NEGATIVE
Influenza B, POC: NEGATIVE

## 2016-05-14 LAB — HM MAMMOGRAPHY

## 2016-06-19 ENCOUNTER — Other Ambulatory Visit: Payer: Self-pay | Admitting: Family Medicine

## 2016-06-19 DIAGNOSIS — J4520 Mild intermittent asthma, uncomplicated: Secondary | ICD-10-CM

## 2016-06-23 ENCOUNTER — Telehealth: Payer: Self-pay | Admitting: Allergy & Immunology

## 2016-06-23 NOTE — Telephone Encounter (Signed)
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

## 2016-06-23 NOTE — Telephone Encounter (Signed)
Noted when patient calls advise of written below

## 2016-07-10 ENCOUNTER — Encounter: Payer: Self-pay | Admitting: Internal Medicine

## 2016-07-11 ENCOUNTER — Encounter: Payer: Self-pay | Admitting: Internal Medicine

## 2016-07-11 ENCOUNTER — Ambulatory Visit (INDEPENDENT_AMBULATORY_CARE_PROVIDER_SITE_OTHER): Payer: BLUE CROSS/BLUE SHIELD | Admitting: Internal Medicine

## 2016-07-11 VITALS — BP 132/78 | HR 78 | Temp 98.1°F | Resp 14 | Ht 66.0 in | Wt 226.1 lb

## 2016-07-11 DIAGNOSIS — J4520 Mild intermittent asthma, uncomplicated: Secondary | ICD-10-CM

## 2016-07-11 DIAGNOSIS — Z09 Encounter for follow-up examination after completed treatment for conditions other than malignant neoplasm: Secondary | ICD-10-CM

## 2016-07-11 DIAGNOSIS — I10 Essential (primary) hypertension: Secondary | ICD-10-CM

## 2016-07-11 DIAGNOSIS — Z0000 Encounter for general adult medical examination without abnormal findings: Secondary | ICD-10-CM

## 2016-07-11 LAB — LIPID PANEL
CHOLESTEROL: 206 mg/dL — AB (ref <200)
HDL: 47 mg/dL — ABNORMAL LOW (ref >50)
LDL Cholesterol: 127 mg/dL — ABNORMAL HIGH (ref <100)
Total CHOL/HDL Ratio: 4.4 Ratio (ref <5.0)
Triglycerides: 158 mg/dL — ABNORMAL HIGH (ref <150)
VLDL: 32 mg/dL — ABNORMAL HIGH (ref <30)

## 2016-07-11 LAB — COMPREHENSIVE METABOLIC PANEL
ALK PHOS: 76 U/L (ref 33–130)
ALT: 21 U/L (ref 6–29)
AST: 25 U/L (ref 10–35)
Albumin: 4.1 g/dL (ref 3.6–5.1)
BILIRUBIN TOTAL: 0.3 mg/dL (ref 0.2–1.2)
BUN: 26 mg/dL — AB (ref 7–25)
CO2: 24 mmol/L (ref 20–31)
CREATININE: 0.81 mg/dL (ref 0.50–1.05)
Calcium: 9.1 mg/dL (ref 8.6–10.4)
Chloride: 104 mmol/L (ref 98–110)
Glucose, Bld: 98 mg/dL (ref 65–99)
POTASSIUM: 4.2 mmol/L (ref 3.5–5.3)
SODIUM: 140 mmol/L (ref 135–146)
TOTAL PROTEIN: 6.7 g/dL (ref 6.1–8.1)

## 2016-07-11 LAB — CBC WITH DIFFERENTIAL/PLATELET
BASOS PCT: 0 %
Basophils Absolute: 0 cells/uL (ref 0–200)
EOS ABS: 142 {cells}/uL (ref 15–500)
Eosinophils Relative: 2 %
HEMATOCRIT: 40 % (ref 35.0–45.0)
Hemoglobin: 13.1 g/dL (ref 11.7–15.5)
LYMPHS ABS: 4473 {cells}/uL — AB (ref 850–3900)
Lymphocytes Relative: 63 %
MCH: 30.1 pg (ref 27.0–33.0)
MCHC: 32.8 g/dL (ref 32.0–36.0)
MCV: 92 fL (ref 80.0–100.0)
MONO ABS: 355 {cells}/uL (ref 200–950)
MPV: 9.8 fL (ref 7.5–12.5)
Monocytes Relative: 5 %
NEUTROS ABS: 2130 {cells}/uL (ref 1500–7800)
Neutrophils Relative %: 30 %
Platelets: 213 10*3/uL (ref 140–400)
RBC: 4.35 MIL/uL (ref 3.80–5.10)
RDW: 13 % (ref 11.0–15.0)
WBC: 7.1 10*3/uL (ref 3.8–10.8)

## 2016-07-11 LAB — TSH: TSH: 0.91 m[IU]/L

## 2016-07-11 MED ORDER — ALBUTEROL SULFATE HFA 108 (90 BASE) MCG/ACT IN AERS: 2.0000 | INHALATION_SPRAY | Freq: Four times a day (QID) | RESPIRATORY_TRACT | 1 refills | Status: DC | PRN

## 2016-07-11 MED ORDER — MOMETASONE FURO-FORMOTEROL FUM 100-5 MCG/ACT IN AERO: 2.0000 | INHALATION_SPRAY | Freq: Two times a day (BID) | RESPIRATORY_TRACT | 4 refills | Status: DC

## 2016-07-11 NOTE — Assessment & Plan Note (Addendum)
--  Tdap 2010 -- [facility]: Reports a colonoscopy in 2008 with Dr. Oletta Lamas,  records reviewed, next 2018 ; declined referral --no recent   gynecology visit: [facility] 2013 per [facility], [place] 2018 per pt; s/p hysterectomy for benign reason, no h/o abn PAPs, no recent PAPs  -(+) FH CAD --Labs: CMP, FLP, CBC, K5L, TSH, D35, folic acid -Diet exercise discussed extensively  -DEXA 07-2013 normal

## 2016-07-11 NOTE — Progress Notes (Signed)
Subjective:    Patient ID: Taylor Silva, female    DOB: 03/08/1955, 61 y.o.   MRN: [id number]  DOS:  07/11/2016 Type of visit - description : Complete physical exam Interval history:  In addition to her physical exam we addressed the number for the issues.  She has a history of asthma, very rarely had problems but in April 2018 was asigned to a new position at work, exposed to dust and since then she had 2 episodes. She thinks the  work environment is triggering the asthma because is very dusty. Current acute exacerbation started a week ago with cough, sinus congestion, wheezing, abundant mucus from the nose. No fever or chills. On  Mucinex but she ran out of her inhalers. Request a form to be completed.    Review of Systems History of leg swelling, chronic, worse by standing. History of back pain, chronic issue. On gabapentin. Usually worse by standing. Also having feet pain described as a burning, bilateral, not worse at night. History of migraines, on and off headache, well-controlled with Excedrin. H/o anxiety, depression. Symptoms are on and off.   Other than above, a 14 point review of systems is negative      Past Medical History:  Diagnosis Date  . Arthritis   . Asthma   . Depression   . DJD (degenerative joint disease), lumbar   . Fructose intolerance   . GERD (gastroesophageal reflux disease)   . H/O hiatal hernia   . Hard of hearing    bilat   . Headache    migraines  . Heart murmur    echo done 80's neg  . History of bronchitis   . History of frequent urinary tract infections   . History of vertigo   . Hyperlipidemia   . Hypertension    currently taking no medications   . Obesity   . Pinched nerve    in back  . PONV (postoperative nausea and vomiting)    patch used last time  . Pre-diabetes   . Restless legs   . Seasonal allergies   . Sleep apnea    Intolerant to CPAP last sleep study  . Tinnitus   . Wears glasses     Past Surgical  History:  Procedure Laterality Date  . ABDOMINAL HYSTERECTOMY  1995   No oophorectomy  . BREATH TEK H PYLORI N/A 06/23/2014   Procedure: BREATH TEK H PYLORI;  Surgeon: Greer Pickerel, MD;  Location: [facility];  Service: General;  Laterality: N/A;  . CHOLECYSTECTOMY N/A 07/12/2015   Procedure: LAPAROSCOPIC CHOLECYSTECTOMY WITH INTRAOPERATIVE CHOLANGIOGRAM;  Surgeon: Greer Pickerel, MD;  Location: [facility];  Service: General;  Laterality: N/A;  . DIAGNOSTIC LAPAROSCOPY    . DILATION AND CURETTAGE OF UTERUS    . KNEE SURGERY  04/2009   right knee, meniscus tear  . LAPAROSCOPIC GASTRIC SLEEVE RESECTION WITH HIATAL HERNIA REPAIR N/A 10/03/2014   Procedure: LAPAROSCOPIC GASTRIC SLEEVE RESECTION WITH HIATAL HERNIA REPAIR W UPPER ENDO;  Surgeon: Greer Pickerel, MD;  Location: [facility];  Service: General;  Laterality: N/A;  . ORIF ANKLE FRACTURE Right 10/17/2013   Procedure: OPEN REDUCTION INTERNAL FIXATION (ORIF) RIGHT ANKLE DISTAL FIBULA/LATERAL MALLEOLUS;  Surgeon: Vickey Huger, MD;  Location: [facility];  Service: Orthopedics;  Laterality: Right;  . OTHER SURGICAL HISTORY  1995?   removed scar tissue from colon  . thigh tuck Bilateral    inner thigh tuck  . UPPER GI ENDOSCOPY N/A 10/03/2014   Procedure: UPPER GI  ENDOSCOPY;  Surgeon: Greer Pickerel, MD;  Location: [facility];  Service: General;  Laterality: N/A;  . WRIST SURGERY  1980s   cyst removed from right wrist    Social History   Social History  . Marital status: Married    Spouse name: N/A  . Number of children: 1  . Years of education: N/A   Occupational History  . 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 Smoking status: Never Smoker  . Smokeless tobacco: Never Used  . Alcohol use No  . Drug use: No  . Sexual activity: Not on file   Other Topics Concern  . Not on file   Social History Narrative   Lives with husband who is transplant patient   Completed some college                 Family History  Problem  Relation Age of Onset  . Diabetes Mother   . Hyperlipidemia Mother   . Hypertension Mother   . Heart attack Mother        26 y/o  . Diabetes Sister   . Hyperlipidemia Sister   . Hypertension Sister   . Diabetes Brother   . Hyperlipidemia Brother   . Hypertension Brother   . Heart attack Brother   . Heart disease Father   . Leukemia Father   . Diabetes Maternal Grandmother   . Hyperlipidemia Maternal Grandmother   . Hypertension Maternal Grandmother   . Breast cancer Other        GM  . Colon cancer Neg Hx      Allergies as of 07/11/2016      Reactions   Codeine Anaphylaxis   Erythromycin Hives   Tetracyclines & Related Hives   Darvon [propoxyphene] Photosensitivity   Blurry vision for 3 days   Morphine And Related Dermatitis   "burning in veins"   Other Other (See Comments)   Tomatoes upset stomach   Penicillins Hives   Has patient had a PCN reaction causing immediate rash, facial/tongue/throat swelling, SOB or lightheadedness with hypotension: Yes Has patient had a PCN reaction causing severe rash involving mucus membranes or skin necrosis: No Has patient had a PCN reaction that required hospitalization No Has patient had a PCN reaction occurring within the last 10 years: No If all of the above answers are "NO", then may proceed with Cephalosporin use.   Sulfa Antibiotics Nausea And Vomiting      Medication List       Accurate as of 07/11/16 11:59 PM. Always use your most recent med list.          albuterol 108 (90 Base) MCG/ACT inhaler Commonly known as:  VENTOLIN HFA Inhale 2 puffs into the lungs every 6 (six) hours as needed for wheezing or shortness of breath.   CALCIUM CITRATE+D3 PETITES PO Take 6 tablets by mouth daily.   Calcium-Vitamin D 600-200 MG-UNIT tablet Take 2 tablets by mouth daily.   FISH OIL PO Take 2 capsules by mouth 3 (three) times daily.   gabapentin 600 MG tablet Commonly known as:  NEURONTIN Take 600 mg by mouth 2 (two) times  daily.   mometasone-formoterol 100-5 MCG/ACT Aero Commonly known as:  DULERA Inhale 2 puffs into the lungs 2 (two) times daily.   multivitamin with minerals tablet Take 2 tablets by mouth daily.   ranitidine 150 MG tablet Commonly known as:  ZANTAC Take 150 mg by mouth daily.  Objective:   Physical Exam BP 132/78 (BP Location: Left Arm, Patient Position: Sitting, Cuff Size: Normal)   Pulse 78   Temp 98.1 F (36.7 C) (Oral)   Resp 14   Ht 5\' 6"  (1.676 m)   Wt 226 lb 2 oz (102.6 kg)   SpO2 98%   BMI 36.50 kg/m   General:   Well developed, well nourished . NAD.  Neck: No  thyromegaly  HEENT:  Normocephalic . Face symmetric, atraumatic. TMs normal. Nose quite congested, though symmetric and not red Lungs:  + Rhonchi bilaterally, increased expiratory time, scattered wheezing. Mild cough noted. Normal respiratory effort, no intercostal retractions, no accessory muscle use. Heart: RRR,  no murmur.  No pretibial edema bilaterally  Abdomen:  Not distended, soft, non-tender. No rebound or rigidity.   Skin: Exposed areas without rash. Not pale. Not jaundice Neurologic:  alert & oriented X3.  Speech normal, gait appropriate for age and unassisted Strength symmetric and appropriate for age.  Psych: Cognition and judgment appear intact.  Cooperative with normal attention span and concentration.  Behavior appropriate. No anxious or depressed appearing.    Assessment & Plan:   Assessment  Pre diabetes Hypertension dyslipidemia  Depression- prozac no help in the past, intolerant cymbalta d/t leg pain RLS, insomnia Asthma OSA CPAP intolerant GERD Headaches, migraines. Back pain, chronic on gabapentin Obesity, s/p bariatric surgery 09-2014 (Dr Redmond Pulling)  Plan: Prediabetes: Check labs HTN: BP normal, on  no medications Dyslipidemia: Last LDL 120, not fasting today, recheck on RTC Asthma: See HPI, two episodes since April 2018, patient thinks related to dust at  work. Has not taken any medication for this particular episode. We'll start dulera bid, albuterol as needed, Mucinex DM. Call if not gradually improving. Work environment: The patient asked me to fill out forms for work , she feels that dust at work is making her asthma worse. I review her job description and they did not mention dust. Form completed. If there is dust at work I  recommend not to work on that environment, use a mask. Feet pain: Check a S50, folic acid and vitamin D. Reassess on RTC She has a number of other issues, sx and concerns:   unable to address all of them today, rec  to come back in one month for a recheck. RTC one month    Today, I spent more than  25  min with the patient: In addition to her CPX, I spent more than much more than 25 minutes counseling her about other issues  including asthma asked her work environment. Several pages of forms were reviewed and completed to the best of my ability.

## 2016-07-11 NOTE — Progress Notes (Signed)
Pre visit review using our clinic review tool, if applicable. No additional management support is needed unless otherwise documented below in the visit note. 

## 2016-07-11 NOTE — Patient Instructions (Addendum)
GO TO THE LAB : Get the blood work     GO TO THE FRONT DESK Schedule your next appointment for a  follow-up in one month    Start taking DULERA  2 puffs every day twice a day  Albuterol as needed  Mucinex DM as needed  Call if no better in the next 10 days  Avoid dusty environments

## 2016-07-12 LAB — FOLATE

## 2016-07-12 LAB — HEMOGLOBIN A1C
Hgb A1c MFr Bld: 5.5 % (ref <5.7)
MEAN PLASMA GLUCOSE: 111 mg/dL

## 2016-07-12 LAB — VITAMIN B12: VITAMIN B 12: 1859 pg/mL — AB (ref 200–1100)

## 2016-07-13 DIAGNOSIS — Z09 Encounter for follow-up examination after completed treatment for conditions other than malignant neoplasm: Secondary | ICD-10-CM | POA: Insufficient documentation

## 2016-07-13 NOTE — Assessment & Plan Note (Signed)
Prediabetes: Check labs HTN: BP normal, on  no medications Dyslipidemia: Last LDL 120, not fasting today, recheck on RTC Asthma: See HPI, two episodes since April 2018, patient thinks related to dust at work. Has not taken any medication for this particular episode. We'll start dulera bid, albuterol as needed, Mucinex DM. Call if not gradually improving. Work environment: The patient asked me to fill out forms for work , she feels that dust at work is making her asthma worse. I review her job description and they did not mention dust. Form completed. If there is dust at work I  recommend not to work on that environment, use a mask. Feet pain: Check a O25, folic acid and vitamin D. Reassess on RTC She has a number of other issues, sx and concerns:   unable to address all of them today, rec  to come back in one month for a recheck. RTC one month

## 2016-08-15 ENCOUNTER — Ambulatory Visit (INDEPENDENT_AMBULATORY_CARE_PROVIDER_SITE_OTHER): Payer: BLUE CROSS/BLUE SHIELD | Admitting: Internal Medicine

## 2016-08-15 ENCOUNTER — Encounter: Payer: Self-pay | Admitting: Internal Medicine

## 2016-08-15 VITALS — BP 126/80 | HR 77 | Temp 97.7°F | Resp 14 | Ht 66.0 in | Wt 229.5 lb

## 2016-08-15 DIAGNOSIS — J310 Chronic rhinitis: Secondary | ICD-10-CM | POA: Diagnosis not present

## 2016-08-15 DIAGNOSIS — Z9884 Bariatric surgery status: Secondary | ICD-10-CM

## 2016-08-15 DIAGNOSIS — Z01419 Encounter for gynecological examination (general) (routine) without abnormal findings: Secondary | ICD-10-CM

## 2016-08-15 DIAGNOSIS — J4520 Mild intermittent asthma, uncomplicated: Secondary | ICD-10-CM

## 2016-08-15 MED ORDER — AZELASTINE HCL 0.1 % NA SOLN: 2.0000 | Freq: Two times a day (BID) | NASAL | 6 refills | Status: DC

## 2016-08-15 MED ORDER — GABAPENTIN 600 MG PO TABS: 600.0000 mg | ORAL_TABLET | Freq: Two times a day (BID) | ORAL | 3 refills | Status: DC

## 2016-08-15 MED ORDER — MOMETASONE FURO-FORMOTEROL FUM 100-5 MCG/ACT IN AERO: 2.0000 | INHALATION_SPRAY | Freq: Two times a day (BID) | RESPIRATORY_TRACT | 6 refills | Status: DC

## 2016-08-15 NOTE — Patient Instructions (Addendum)
  GO TO THE FRONT DESK Schedule your next appointment for a  Physical exam by 07-2017   Use astelin 2 sprays on each side of the nose twice a day for 6 weeks  Use Flonase 2 sprays in each side of the nose daily for 6 weeks  Call if you are not better for a ENT referral.

## 2016-08-15 NOTE — Progress Notes (Signed)
Subjective:    Patient ID: Taylor Silva, female    DOB: 1955-02-21, 61 y.o.   MRN: [id number]  DOS:  08/15/2016 Type of visit - description : f/u Interval history: Asthma, she took [facility] for several days, felt better, currently not taking it and feels well. Would like a refill. Had a URI several weeks ago, since then has lost her sense of smell and taste. Admits to her nose being stopped up constantly but no nasal discharge Needs a refill on gabapentin Needs a referral to gynecology Wonders if she needs to continue seeing the bariatric surgeon. Surgery was ~ 2 years ago  Review of Systems   Past Medical History:  Diagnosis Date  . Arthritis   . Asthma   . Depression   . DJD (degenerative joint disease), lumbar   . Fructose intolerance   . GERD (gastroesophageal reflux disease)   . H/O hiatal hernia   . Hard of hearing    bilat   . Headache    migraines  . Heart murmur    echo done 80's neg  . History of bronchitis   . History of frequent urinary tract infections   . History of vertigo   . Hyperlipidemia   . Hypertension    currently taking no medications   . Obesity   . Pinched nerve    in back  . PONV (postoperative nausea and vomiting)    patch used last time  . Pre-diabetes   . Restless legs   . Seasonal allergies   . Sleep apnea    Intolerant to CPAP last sleep study  . Tinnitus   . Wears glasses     Past Surgical History:  Procedure Laterality Date  . ABDOMINAL HYSTERECTOMY  1995   No oophorectomy  . BREATH TEK H PYLORI N/A 06/23/2014   Procedure: BREATH TEK H PYLORI;  Surgeon: Greer Pickerel, MD;  Location: [facility];  Service: General;  Laterality: N/A;  . CHOLECYSTECTOMY N/A 07/12/2015   Procedure: LAPAROSCOPIC CHOLECYSTECTOMY WITH INTRAOPERATIVE CHOLANGIOGRAM;  Surgeon: Greer Pickerel, MD;  Location: [facility];  Service: General;  Laterality: N/A;  . DIAGNOSTIC LAPAROSCOPY    . DILATION AND CURETTAGE OF UTERUS    . KNEE SURGERY  04/2009   right  knee, meniscus tear  . LAPAROSCOPIC GASTRIC SLEEVE RESECTION WITH HIATAL HERNIA REPAIR N/A 10/03/2014   Procedure: LAPAROSCOPIC GASTRIC SLEEVE RESECTION WITH HIATAL HERNIA REPAIR W UPPER ENDO;  Surgeon: Greer Pickerel, MD;  Location: [facility];  Service: General;  Laterality: N/A;  . ORIF ANKLE FRACTURE Right 10/17/2013   Procedure: OPEN REDUCTION INTERNAL FIXATION (ORIF) RIGHT ANKLE DISTAL FIBULA/LATERAL MALLEOLUS;  Surgeon: Vickey Huger, MD;  Location: [facility];  Service: Orthopedics;  Laterality: Right;  . OTHER SURGICAL HISTORY  1995?   removed scar tissue from colon  . thigh tuck Bilateral    inner thigh tuck  . UPPER GI ENDOSCOPY N/A 10/03/2014   Procedure: UPPER GI ENDOSCOPY;  Surgeon: Greer Pickerel, MD;  Location: [facility];  Service: General;  Laterality: N/A;  . WRIST SURGERY  1980s   cyst removed from right wrist    Social History   Social History  . Marital status: Married    Spouse name: N/A  . Number of children: 1  . Years of education: N/A   Occupational History  . 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 Smoking status: Never Smoker  . Smokeless tobacco: Never Used  . Alcohol use No  .  Drug use: No  . Sexual activity: Not on file   Other Topics Concern  . Not on file   Social History Narrative   Lives with husband who is transplant patient   Completed some college                  Allergies as of 08/15/2016      Reactions   Codeine Anaphylaxis   Erythromycin Hives   Tetracyclines & Related Hives   Darvon [propoxyphene] Photosensitivity   Blurry vision for 3 days   Morphine And Related Dermatitis   "burning in veins"   Other Other (See Comments)   Tomatoes upset stomach   Penicillins Hives   Has patient had a PCN reaction causing immediate rash, facial/tongue/throat swelling, SOB or lightheadedness with hypotension: Yes Has patient had a PCN reaction causing severe rash involving mucus membranes or skin necrosis: No Has patient  had a PCN reaction that required hospitalization No Has patient had a PCN reaction occurring within the last 10 years: No If all of the above answers are "NO", then may proceed with Cephalosporin use.   Sulfa Antibiotics Nausea And Vomiting      Medication List       Accurate as of 08/15/16 11:59 PM. Always use your most recent med list.          albuterol 108 (90 Base) MCG/ACT inhaler Commonly known as:  VENTOLIN HFA Inhale 2 puffs into the lungs every 6 (six) hours as needed for wheezing or shortness of breath.   azelastine 0.1 % nasal spray Commonly known as:  ASTELIN Place 2 sprays into both nostrils 2 (two) times daily. Use in each nostril as directed   CALCIUM CITRATE+D3 PETITES PO Take 6 tablets by mouth daily.   Calcium-Vitamin D 600-200 MG-UNIT tablet Take 2 tablets by mouth daily.   FISH OIL PO Take 2 capsules by mouth 3 (three) times daily.   gabapentin 600 MG tablet Commonly known as:  NEURONTIN Take 1 tablet (600 mg total) by mouth 2 (two) times daily.   mometasone-formoterol 100-5 MCG/ACT Aero Commonly known as:  DULERA Inhale 2 puffs into the lungs 2 (two) times daily.   multivitamin with minerals tablet Take 2 tablets by mouth daily.   ranitidine 150 MG tablet Commonly known as:  ZANTAC Take 150 mg by mouth daily.          Objective:   Physical Exam BP 126/80 (BP Location: Left Arm, Patient Position: Sitting, Cuff Size: Normal)   Pulse 77   Temp 97.7 F (36.5 C) (Oral)   Resp 14   Ht 5\' 6"  (1.676 m)   Wt 229 lb 8 oz (104.1 kg)   SpO2 97%   BMI 37.04 kg/m  General:   Well developed, well nourished . NAD.  HEENT:  Normocephalic . Face symmetric, atraumatic. TMs slightly bulge bilaterally, throat symmetric and not red. Nose with minimal congestion, sinuses no TTP Lungs:  CTA B Normal respiratory effort, no intercostal retractions, no accessory muscle use. Heart: RRR,  no murmur.  No pretibial edema bilaterally  Skin: Not pale. Not  jaundice Neurologic:  alert & oriented X3.  Speech normal, gait appropriate for age and unassisted Psych--  Cognition and judgment appear intact.  Cooperative with normal attention span and concentration.  Behavior appropriate. No anxious or depressed appearing.      Assessment & Plan:     Assessment  Pre diabetes Hypertension dyslipidemia  Depression- prozac no help in the past, intolerant  cymbalta d/t leg pain RLS, insomnia Asthma OSA CPAP intolerant GERD Headaches, migraines. Back pain, chronic on gabapentin Obesity, s/p bariatric surgery 09-2014 (Dr Redmond Pulling)  Plan: Asthma: Since the last visit, used Dulera temporarily, then stop taking it. Currently feeling well. We agreed to take albuterol as a rescue inhaler and restart Dulera for few weeks for the times of the year she expects asthma symptoms to increase (ie fall) Allergies, rhinitis: No sense of smell or taste since she had a URI a few weeks ago, complains of nasal congestion. Recommend Astelin twice a day and Flonase daily. If no better call for ENT referral. Back pain: Refill gabapentin S/p  bariatric surgery: Wonders if she could stop seeing her surgeon for follow-up, we agreed I'II check yearly labs (in addition to her routine labs will do a  H72, folic acid and vitamin D). She is to continue taking 2 multivitamins daily;  recommend good compliance with B12, calcium and vitamin D supplements. Primary care: Needs a referral to see gynecology

## 2016-08-15 NOTE — Progress Notes (Signed)
Pre visit review using our clinic review tool, if applicable. No additional management support is needed unless otherwise documented below in the visit note. 

## 2016-08-16 NOTE — Assessment & Plan Note (Signed)
Plan: Asthma: Since the last visit, used Dulera temporarily, then stop taking it. Currently feeling well. We agreed to take albuterol as a rescue inhaler and restart Dulera for few weeks for the times of the year she expects asthma symptoms to increase (ie fall) Allergies, rhinitis: No sense of smell or taste since she had a URI a few weeks ago, complains of nasal congestion. Recommend Astelin twice a day and Flonase daily. If no better call for ENT referral. Back pain: Refill gabapentin S/p  bariatric surgery: Wonders if she could stop seeing her surgeon for follow-up, we agreed I'II check yearly labs (in addition to her routine labs will do a  P80, folic acid and vitamin D). She is to continue taking 2 multivitamins daily;  recommend good compliance with B12, calcium and vitamin D supplements. Primary care: Needs a referral to see gynecology

## 2016-09-05 ENCOUNTER — Telehealth: Payer: Self-pay | Admitting: Internal Medicine

## 2016-09-05 DIAGNOSIS — J310 Chronic rhinitis: Secondary | ICD-10-CM

## 2016-09-05 DIAGNOSIS — R430 Anosmia: Secondary | ICD-10-CM

## 2016-09-05 NOTE — Telephone Encounter (Signed)
OK to place referral. TY.  

## 2016-09-05 NOTE — Telephone Encounter (Signed)
Relation to pt: self  Call back number:[phone]   Reason for call:  Patient was advised if symptoms didn't improve PCP would place ENT referral, patient would like to be referred to  [facility] due to patient working [employer] Please advise

## 2016-09-05 NOTE — Telephone Encounter (Signed)
JP-Patient was advised if symptoms didn't improve PCP would place ENT referral, patient would like to be referred to  [facility] due to patient working [employer] Please advise

## 2016-09-09 NOTE — Telephone Encounter (Signed)
ENT referral placed.

## 2016-12-30 ENCOUNTER — Other Ambulatory Visit: Payer: Self-pay

## 2016-12-30 ENCOUNTER — Emergency Department (HOSPITAL_BASED_OUTPATIENT_CLINIC_OR_DEPARTMENT_OTHER): Payer: BLUE CROSS/BLUE SHIELD

## 2016-12-30 ENCOUNTER — Encounter (HOSPITAL_BASED_OUTPATIENT_CLINIC_OR_DEPARTMENT_OTHER): Payer: Self-pay | Admitting: Emergency Medicine

## 2016-12-30 ENCOUNTER — Emergency Department (HOSPITAL_BASED_OUTPATIENT_CLINIC_OR_DEPARTMENT_OTHER)
Admission: EM | Admit: 2016-12-30 | Discharge: 2016-12-30 | Disposition: A | Discharge status: Home / Self Care | Payer: BLUE CROSS/BLUE SHIELD | Attending: Emergency Medicine | Admitting: Emergency Medicine

## 2016-12-30 DIAGNOSIS — Z79899 Other long term (current) drug therapy: Secondary | ICD-10-CM | POA: Diagnosis not present

## 2016-12-30 DIAGNOSIS — R05 Cough: Secondary | ICD-10-CM | POA: Diagnosis present

## 2016-12-30 DIAGNOSIS — I10 Essential (primary) hypertension: Secondary | ICD-10-CM | POA: Insufficient documentation

## 2016-12-30 DIAGNOSIS — J45909 Unspecified asthma, uncomplicated: Secondary | ICD-10-CM | POA: Diagnosis not present

## 2016-12-30 DIAGNOSIS — J069 Acute upper respiratory infection, unspecified: Secondary | ICD-10-CM | POA: Diagnosis not present

## 2016-12-30 MED ORDER — BENZONATATE 100 MG PO CAPS: 100.0000 mg | ORAL_CAPSULE | Freq: Three times a day (TID) | ORAL | 0 refills | Status: DC

## 2016-12-30 MED ORDER — PREDNISONE 50 MG PO TABS
60.0000 mg | ORAL_TABLET | Freq: Once | ORAL | Status: AC
  Administered 2016-12-30: 60 mg via ORAL
  Filled 2016-12-30: qty 1

## 2016-12-30 MED ORDER — PREDNISONE 20 MG PO TABS: 20.0000 mg | ORAL_TABLET | Freq: Two times a day (BID) | ORAL | 0 refills | Status: DC

## 2016-12-30 MED ORDER — BENZONATATE 100 MG PO CAPS
100.0000 mg | ORAL_CAPSULE | Freq: Once | ORAL | Status: AC
  Administered 2016-12-30: 100 mg via ORAL
  Filled 2016-12-30: qty 1

## 2016-12-30 NOTE — Discharge Instructions (Signed)
Continue albuterol as needed.  Tessalon for cough  Take prednisone as prescribed.

## 2016-12-30 NOTE — ED Triage Notes (Signed)
Patient states that for the last 10 days she has had cough and congestion. The patient reports that she has started to have chest tightness and trouble breathing over the last 2 -3 days

## 2016-12-30 NOTE — ED Provider Notes (Signed)
[facility] EMERGENCY DEPARTMENT Provider Note   CSN: [id number] Arrival date & time: 12/30/16  1735     History   Chief Complaint Chief Complaint  Patient presents with  . Cough    HPI Taylor Silva is a 61 y.o. female.  Cheaply as cough, sore throat  HPI  34-year-old female. Symptoms for 7 days. Cough. Sore throat. Had sinus pain and pressure. States this has resolved taking over-the-counter medications.  Still coughing. Using her inhaler at home with some relief. No fever. No bloody nasal discharge. No sputum or shortness of breath.  Past Medical History:  Diagnosis Date  . Arthritis   . Asthma   . Depression   . DJD (degenerative joint disease), lumbar   . Fructose intolerance   . GERD (gastroesophageal reflux disease)   . H/O hiatal hernia   . Hard of hearing    bilat   . Headache    migraines  . Heart murmur    echo done 80's neg  . History of bronchitis   . History of frequent urinary tract infections   . History of vertigo   . Hyperlipidemia   . Hypertension    currently taking no medications   . Obesity   . Pinched nerve    in back  . PONV (postoperative nausea and vomiting)    patch used last time  . Pre-diabetes   . Restless legs   . Seasonal allergies   . Sleep apnea    Intolerant to CPAP last sleep study  . Tinnitus   . Wears glasses     Patient Active Problem List   Diagnosis Date Noted  . PCP NOTES >>>>>>>>>>>>... 07/13/2016  . Asthma intermitent mild 02/04/2016  . S/P laparoscopic cholecystectomy 07/12/2015  . GERD (gastroesophageal reflux disease) 10/03/2014  . Hiatal hernia 10/03/2014  . 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 Preoperative clearance 05/19/2014  . Prediabetes 04/06/2014  . Hypertension 04/06/2014  . Hyperlipidemia 04/06/2014  . Ankle pain 11/17/2013  . Closed fracture of ankle 10/12/2013  . Otalgia of both ears 08/02/2013  . Headache(784.0) 08/02/2013  . Chronic rhinitis 08/02/2013  .  Obesity, Class III, BMI 40-49.9 (morbid obesity) ([place]) 10/06/2012  . Annual physical exam 06/15/2012  . RLS (restless legs syndrome) 06/15/2012  . Back pain 06/15/2012  . Abdominal pain 07/28/2011  . Depression 06/06/2011  . Joint pain 06/06/2011  . OSA (obstructive sleep apnea) 06/06/2011  . Heel pain 07/25/2010  . Right knee pain 07/25/2010  . Other and unspecified hyperlipidemia 07/18/2010  . 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 ABDOMINAL HYSTERECTOMY  1995   No oophorectomy  . BREATH TEK H PYLORI N/A 06/23/2014   Procedure: BREATH TEK H PYLORI;  Surgeon: Greer Pickerel, MD;  Location: [facility];  Service: General;  Laterality: N/A;  . CHOLECYSTECTOMY N/A 07/12/2015   Procedure: LAPAROSCOPIC CHOLECYSTECTOMY WITH INTRAOPERATIVE CHOLANGIOGRAM;  Surgeon: Greer Pickerel, MD;  Location: [facility];  Service: General;  Laterality: N/A;  . DIAGNOSTIC LAPAROSCOPY    . DILATION AND CURETTAGE OF UTERUS    . KNEE SURGERY  04/2009   right knee, meniscus tear  . LAPAROSCOPIC GASTRIC SLEEVE RESECTION WITH HIATAL HERNIA REPAIR N/A 10/03/2014   Procedure: LAPAROSCOPIC GASTRIC SLEEVE RESECTION WITH HIATAL HERNIA REPAIR W UPPER ENDO;  Surgeon: Greer Pickerel, MD;  Location: [facility];  Service: General;  Laterality: N/A;  . ORIF ANKLE FRACTURE Right 10/17/2013   Procedure: OPEN REDUCTION  INTERNAL FIXATION (ORIF) RIGHT ANKLE DISTAL FIBULA/LATERAL MALLEOLUS;  Surgeon: Vickey Huger, MD;  Location: [facility];  Service: Orthopedics;  Laterality: Right;  . OTHER SURGICAL HISTORY  1995?   removed scar tissue from colon  . thigh tuck Bilateral    inner thigh tuck  . UPPER GI ENDOSCOPY N/A 10/03/2014   Procedure: UPPER GI ENDOSCOPY;  Surgeon: Greer Pickerel, MD;  Location: [facility];  Service: General;  Laterality: N/A;  . WRIST SURGERY  1980s   cyst removed from right wrist    OB History    No data available       Home Medications    Prior to Admission  medications   Medication Sig Start Date End Date Taking? Authorizing Provider  albuterol (VENTOLIN HFA) 108 (90 Base) MCG/ACT inhaler Inhale 2 puffs into the lungs every 6 (six) hours as needed for wheezing or shortness of breath. 07/11/16   Colon Branch, MD  azelastine (ASTELIN) 0.1 % nasal spray Place 2 sprays into both nostrils 2 (two) times daily. Use in each nostril as directed 08/15/16   Colon Branch, MD  benzonatate (TESSALON) 100 MG capsule Take 1 capsule (100 mg total) by mouth every 8 (eight) hours. 12/30/16   Tanna Furry, MD  Calcium Citrate-Vitamin D (CALCIUM CITRATE+D3 PETITES PO) Take 6 tablets by mouth daily.    [provider]  Calcium-Vitamin D 600-200 MG-UNIT per tablet Take 2 tablets by mouth daily.    [provider]  gabapentin (NEURONTIN) 600 MG tablet Take 1 tablet (600 mg total) by mouth 2 (two) times daily. 08/15/16   Colon Branch, MD  mometasone-formoterol [facility]) 100-5 MCG/ACT AERO Inhale 2 puffs into the lungs 2 (two) times daily. 08/15/16   Colon Branch, MD  Multiple Vitamins-Minerals (MULTIVITAMIN WITH MINERALS) tablet Take 2 tablets by mouth daily.     [provider]  Omega-3 Fatty Acids (FISH OIL PO) Take 2 capsules by mouth 3 (three) times daily.    [provider]  predniSONE (DELTASONE) 20 MG tablet Take 1 tablet (20 mg total) by mouth 2 (two) times daily with a meal. 12/30/16   Tanna Furry, MD  ranitidine (ZANTAC) 150 MG tablet Take 150 mg by mouth daily.    [provider]    Family History Family History  Problem Relation Age of Onset  . Diabetes Mother   . Hyperlipidemia Mother   . Hypertension Mother   . Heart attack Mother        33 y/o  . Diabetes Sister   . Hyperlipidemia Sister   . Hypertension Sister   . Diabetes Brother   . Hyperlipidemia Brother   . Hypertension Brother   . Heart attack Brother   . Heart disease Father   . Leukemia Father   . Diabetes Maternal Grandmother   . Hyperlipidemia Maternal  Grandmother   . Hypertension Maternal Grandmother   . Breast cancer Other        GM  . Colon cancer Neg Hx     Social History Social History   Tobacco Use  . Smoking status: Never Smoker  . Smokeless tobacco: Never Used  Substance Use Topics  . Alcohol use: No  . Drug use: No     Allergies   Codeine; Erythromycin; Tetracyclines & related; Darvon [propoxyphene]; Morphine and related; Other; Penicillins; and Sulfa antibiotics   Review of Systems Review of Systems  Constitutional: Negative for appetite change, chills, diaphoresis, fatigue and fever.  HENT: Positive for congestion  and sore throat. Negative for mouth sores and trouble swallowing.   Eyes: Negative for visual disturbance.  Respiratory: Positive for cough and wheezing. Negative for chest tightness and shortness of breath.   Cardiovascular: Negative for chest pain.  Gastrointestinal: Negative for abdominal distention, abdominal pain, diarrhea, nausea and vomiting.  Endocrine: Negative for polydipsia, polyphagia and polyuria.  Genitourinary: Negative for dysuria, frequency and hematuria.  Musculoskeletal: Negative for gait problem.  Skin: Negative for color change, pallor and rash.  Neurological: Negative for dizziness, syncope, light-headedness and headaches.  Hematological: Does not bruise/bleed easily.  Psychiatric/Behavioral: Negative for behavioral problems and confusion.     Physical Exam Updated Vital Signs BP 126/86 (BP Location: Right Arm)   Pulse 84   Temp 99 F (37.2 C) (Oral)   Resp 16   Ht 5\' 6"  (1.676 m)   Wt 104.3 kg (230 lb)   SpO2 100%   BMI 37.12 kg/m   Physical Exam  Constitutional: She is oriented to person, place, and time. She appears well-developed and well-nourished. No distress.  HENT:  Head: Normocephalic.  Normal pharynx. Nares appear clear.  Eyes: Conjunctivae are normal. Pupils are equal, round, and reactive to light. No scleral icterus.  Neck: Normal range of motion.  Neck supple. No thyromegaly present.  Cardiovascular: Normal rate and regular rhythm. Exam reveals no gallop and no friction rub.  No murmur heard. Pulmonary/Chest: Effort normal and breath sounds normal. No respiratory distress. She has no wheezes. She has no rales.  Clear bilateral breath sounds. No wheezing or prolongation.  Abdominal: Soft. Bowel sounds are normal. She exhibits no distension. There is no tenderness. There is no rebound.  Musculoskeletal: Normal range of motion.  Neurological: She is alert and oriented to person, place, and time.  Skin: Skin is warm and dry. No rash noted.  Psychiatric: She has a normal mood and affect. Her behavior is normal.     ED Treatments / Results  Labs (all labs ordered are listed, but only abnormal results are displayed) Labs Reviewed - No data to display  EKG  EKG Interpretation None       Radiology Dg Chest 2 View  Result Date: 12/30/2016 CLINICAL DATA:  Cough, fever EXAM: CHEST  2 VIEW COMPARISON:  06/22/2014 FINDINGS: The heart size and mediastinal contours are within normal limits. Both lungs are clear. The visualized skeletal structures are unremarkable. IMPRESSION: No active cardiopulmonary disease. Electronically Signed   By: Kathreen Devoid   On: 12/30/2016 18:22    Procedures Procedures (including critical care time)  Medications Ordered in ED Medications  predniSONE (DELTASONE) tablet 60 mg (60 mg Oral Given 12/30/16 1901)  benzonatate (TESSALON) capsule 100 mg (100 mg Oral Given 12/30/16 1901)     Initial Impression / Assessment and Plan / ED Course  I have reviewed the triage vital signs and the nursing notes.  Pertinent labs & imaging results that were available during my care of the patient were reviewed by me and considered in my medical decision making (see chart for details).    Normal chest x-ray. Not febrile or hypoxemic. Given by mouth prednisone.  Return for prednisone Tessalon. Continue albuterol when  necessary.  No indicators for acute bacterial infection or antibiotics at this time  Final Clinical Impressions(s) / ED Diagnoses   Final diagnoses:  Acute upper respiratory infection    ED Discharge Orders        Ordered    predniSONE (DELTASONE) 20 MG tablet  2 times daily with meals  12/30/16 1840    benzonatate (TESSALON) 100 MG capsule  Every 8 hours     12/30/16 1840       Tanna Furry, MD 12/30/16 2136

## 2017-01-16 ENCOUNTER — Encounter: Payer: Self-pay | Admitting: Internal Medicine

## 2017-01-16 ENCOUNTER — Ambulatory Visit: Payer: BLUE CROSS/BLUE SHIELD | Admitting: Internal Medicine

## 2017-01-16 VITALS — BP 126/70 | HR 84 | Temp 98.5°F | Resp 14 | Ht 66.0 in | Wt 237.1 lb

## 2017-01-16 DIAGNOSIS — J019 Acute sinusitis, unspecified: Secondary | ICD-10-CM

## 2017-01-16 DIAGNOSIS — Z09 Encounter for follow-up examination after completed treatment for conditions other than malignant neoplasm: Secondary | ICD-10-CM

## 2017-01-16 DIAGNOSIS — J4541 Moderate persistent asthma with (acute) exacerbation: Secondary | ICD-10-CM | POA: Diagnosis not present

## 2017-01-16 MED ORDER — AZELASTINE HCL 0.1 % NA SOLN: 2.0000 | Freq: Two times a day (BID) | NASAL | 6 refills | Status: DC

## 2017-01-16 MED ORDER — MOMETASONE FURO-FORMOTEROL FUM 100-5 MCG/ACT IN AERO: 2.0000 | INHALATION_SPRAY | Freq: Two times a day (BID) | RESPIRATORY_TRACT | 6 refills | Status: DC

## 2017-01-16 MED ORDER — AZITHROMYCIN 250 MG PO TABS: ORAL_TABLET | ORAL | 0 refills | Status: DC

## 2017-01-16 NOTE — Progress Notes (Signed)
Pre visit review using our clinic review tool, if applicable. No additional management support is needed unless otherwise documented below in the visit note. 

## 2017-01-16 NOTE — Progress Notes (Signed)
Subjective:    Patient ID: Taylor Silva, female    DOB: Oct 16, 1955, 62 y.o.   MRN: [id number]  DOS:  01/16/2017 Type of visit - description :  ER f/u Interval history: Symptoms started the first week of December, eventually she went to the ER 12/30/2016, reporting cough and sore throat. Records reviewed: CXR was negative, was prescribed Tessalon Perles and prednisone. The medications did not help. Continue with persisting cough, feels congestion in the chest but is not able to bring sputum up. Sinuses are quite congested, she sees yellow and bloody discharge from time to time. Has a constant sore throat.    Review of Systems Has fever, subjective on and off. Chest pain only with cough Some shortness of breath but mostly from her nose being congested. + Wheezing. No nausea, vomiting, diarrhea.  No rash.  Past Medical History:  Diagnosis Date  . Arthritis   . Asthma   . Depression   . DJD (degenerative joint disease), lumbar   . Fructose intolerance   . GERD (gastroesophageal reflux disease)   . H/O hiatal hernia   . Hard of hearing    bilat   . Headache    migraines  . Heart murmur    echo done 80's neg  . History of bronchitis   . History of frequent urinary tract infections   . History of vertigo   . Hyperlipidemia   . Hypertension    currently taking no medications   . Obesity   . Pinched nerve    in back  . PONV (postoperative nausea and vomiting)    patch used last time  . Pre-diabetes   . Restless legs   . Seasonal allergies   . Sleep apnea    Intolerant to CPAP last sleep study  . Tinnitus   . Wears glasses     Past Surgical History:  Procedure Laterality Date  . ABDOMINAL HYSTERECTOMY  1995   No oophorectomy  . BREATH TEK H PYLORI N/A 06/23/2014   Procedure: BREATH TEK H PYLORI;  Surgeon: Greer Pickerel, MD;  Location: [facility];  Service: General;  Laterality: N/A;  . CHOLECYSTECTOMY N/A 07/12/2015   Procedure: LAPAROSCOPIC CHOLECYSTECTOMY  WITH INTRAOPERATIVE CHOLANGIOGRAM;  Surgeon: Greer Pickerel, MD;  Location: [facility];  Service: General;  Laterality: N/A;  . DIAGNOSTIC LAPAROSCOPY    . DILATION AND CURETTAGE OF UTERUS    . KNEE SURGERY  04/2009   right knee, meniscus tear  . LAPAROSCOPIC GASTRIC SLEEVE RESECTION WITH HIATAL HERNIA REPAIR N/A 10/03/2014   Procedure: LAPAROSCOPIC GASTRIC SLEEVE RESECTION WITH HIATAL HERNIA REPAIR W UPPER ENDO;  Surgeon: Greer Pickerel, MD;  Location: [facility];  Service: General;  Laterality: N/A;  . ORIF ANKLE FRACTURE Right 10/17/2013   Procedure: OPEN REDUCTION INTERNAL FIXATION (ORIF) RIGHT ANKLE DISTAL FIBULA/LATERAL MALLEOLUS;  Surgeon: Vickey Huger, MD;  Location: [facility];  Service: Orthopedics;  Laterality: Right;  . OTHER SURGICAL HISTORY  1995?   removed scar tissue from colon  . thigh tuck Bilateral    inner thigh tuck  . UPPER GI ENDOSCOPY N/A 10/03/2014   Procedure: UPPER GI ENDOSCOPY;  Surgeon: Greer Pickerel, MD;  Location: [facility];  Service: General;  Laterality: N/A;  . WRIST SURGERY  1980s   cyst removed from right wrist    Social History   Socioeconomic History  . Marital status: Married    Spouse name: Not on file  . Number of children: 1  . Years of education: Not on  file  . Highest education level: Not on file  Social Needs  . Financial resource strain: Not on file  . Food insecurity - worry: Not on file  . Food insecurity - inability: Not on file  . Transportation needs - medical: Not on file  . Transportation needs - non-medical: Not on file  Occupational History  . Occupation: Futures trader: [employer]  Tobacco Use  . Smoking status: Never Smoker  . Smokeless tobacco: Never Used  Substance and Sexual Activity  . Alcohol use: No  . Drug use: No  . Sexual activity: Not on file  Other Topics Concern  . Not on file  Social History Narrative   Lives with husband who is transplant patient   Completed some college               Allergies  as of 01/16/2017      Reactions   Codeine Anaphylaxis   Erythromycin Hives   Tetracyclines & Related Hives   Darvon [propoxyphene] Photosensitivity   Blurry vision for 3 days   Morphine And Related Dermatitis   "burning in veins"   Other Other (See Comments)   Tomatoes upset stomach   Penicillins Hives   Has patient had a PCN reaction causing immediate rash, facial/tongue/throat swelling, SOB or lightheadedness with hypotension: Yes Has patient had a PCN reaction causing severe rash involving mucus membranes or skin necrosis: No Has patient had a PCN reaction that required hospitalization No Has patient had a PCN reaction occurring within the last 10 years: No If all of the above answers are "NO", then may proceed with Cephalosporin use.   Sulfa Antibiotics Nausea And Vomiting      Medication List        Accurate as of 01/16/17  5:16 PM. Always use your most recent med list.          albuterol 108 (90 Base) MCG/ACT inhaler Commonly known as:  VENTOLIN HFA Inhale 2 puffs into the lungs every 6 (six) hours as needed for wheezing or shortness of breath.   azelastine 0.1 % nasal spray Commonly known as:  ASTELIN Place 2 sprays into both nostrils 2 (two) times daily. Use in each nostril as directed   azithromycin 250 MG tablet Commonly known as:  ZITHROMAX Z-PAK 2 tabs a day the first day, then 1 tab a day x 4 days   CALCIUM CITRATE+D3 PETITES PO Take 6 tablets by mouth daily.   Calcium-Vitamin D 600-200 MG-UNIT tablet Take 2 tablets by mouth daily.   FISH OIL PO Take 2 capsules by mouth 3 (three) times daily.   gabapentin 600 MG tablet Commonly known as:  NEURONTIN Take 1 tablet (600 mg total) by mouth 2 (two) times daily.   mometasone-formoterol 100-5 MCG/ACT Aero Commonly known as:  DULERA Inhale 2 puffs into the lungs 2 (two) times daily.   multivitamin with minerals tablet Take 2 tablets by mouth daily.   ranitidine 150 MG tablet Commonly known as:   ZANTAC Take 150 mg by mouth daily.          Objective:   Physical Exam BP 126/70 (BP Location: Left Arm, Patient Position: Sitting, Cuff Size: Normal)   Pulse 84   Temp 98.5 F (36.9 C) (Oral)   Resp 14   Ht 5\' 6"  (1.676 m)   Wt 237 lb 2 oz (107.6 kg)   SpO2 96%   BMI 38.27 kg/m     General:  Well developed, well nourished . NAD.  HEENT:  Normocephalic . Face symmetric, atraumatic.  TMs bulge but no red.  Nose quite congested, sinuses slightly TTP.  Throat symmetric and not red Lungs:  CTA B, mildly increased expiratory time Normal respiratory effort, no intercostal retractions, no accessory muscle use. Heart: RRR,  no murmur.  No pretibial edema bilaterally  Skin: Not pale. Not jaundice Neurologic:  alert & oriented X3.  Speech normal, gait appropriate for age and unassisted Psych--  Cognition and judgment appear intact.  Cooperative with normal attention span and concentration.  Behavior appropriate. No anxious or depressed appearing.     Assessment & Plan:   Assessment  Pre diabetes Hypertension dyslipidemia  Depression- prozac no help in the past, intolerant cymbalta d/t leg pain RLS, insomnia Asthma OSA CPAP intolerant GERD Headaches, migraines. Back pain, chronic on gabapentin Obesity, s/p bariatric surgery 09-2014 (Dr Redmond Pulling)  Plan: Acute sinusitis Sx c/w sinusitis; that is likely exacerbating her asthma.  Not taking Astelin or Dulera (due to cost).  Has multiple ABX allergies but tolerates Z-Pak. 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

## 2017-01-16 NOTE — Assessment & Plan Note (Signed)
Acute sinusitis Sx c/w sinusitis; that is likely exacerbating her asthma.  Not taking Astelin or Dulera (due to cost).  Has multiple ABX allergies but tolerates Z-Pak. 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

## 2017-01-16 NOTE — Patient Instructions (Signed)
Rest, fluids , tylenol  For cough:  Take Mucinex DM twice a day as needed until better  For asthma: Go back on Dulera twice a day, use albuterol as a rescue inhaler  For nasal congestion: Use OTC   Flonase : 2 nasal sprays on each side of the nose in the morning until you feel better Use ASTELIN a prescribed spray : 2 nasal sprays on each side of the nose at night until you feel better    Take the antibiotic as prescribed for sinusitis (Zithromax)  Call if not gradually better over the next  10 days  Call anytime if the symptoms are severe

## 2017-05-05 ENCOUNTER — Encounter (HOSPITAL_COMMUNITY): Payer: Self-pay

## 2018-03-09 ENCOUNTER — Encounter: Payer: Self-pay | Admitting: Internal Medicine

## 2018-03-09 ENCOUNTER — Ambulatory Visit (INDEPENDENT_AMBULATORY_CARE_PROVIDER_SITE_OTHER): Payer: PRIVATE HEALTH INSURANCE | Admitting: Internal Medicine

## 2018-03-09 VITALS — BP 132/84 | HR 80 | Temp 98.0°F | Resp 16 | Ht 66.0 in | Wt 259.4 lb

## 2018-03-09 DIAGNOSIS — Z01419 Encounter for gynecological examination (general) (routine) without abnormal findings: Secondary | ICD-10-CM

## 2018-03-09 DIAGNOSIS — Z23 Encounter for immunization: Secondary | ICD-10-CM

## 2018-03-09 DIAGNOSIS — F32A Depression, unspecified: Secondary | ICD-10-CM

## 2018-03-09 DIAGNOSIS — Z9884 Bariatric surgery status: Secondary | ICD-10-CM | POA: Diagnosis not present

## 2018-03-09 DIAGNOSIS — R739 Hyperglycemia, unspecified: Secondary | ICD-10-CM | POA: Diagnosis not present

## 2018-03-09 DIAGNOSIS — Z0000 Encounter for general adult medical examination without abnormal findings: Secondary | ICD-10-CM | POA: Diagnosis not present

## 2018-03-09 DIAGNOSIS — Z1231 Encounter for screening mammogram for malignant neoplasm of breast: Secondary | ICD-10-CM

## 2018-03-09 DIAGNOSIS — Z780 Asymptomatic menopausal state: Secondary | ICD-10-CM

## 2018-03-09 DIAGNOSIS — F329 Major depressive disorder, single episode, unspecified: Secondary | ICD-10-CM | POA: Diagnosis not present

## 2018-03-09 DIAGNOSIS — R079 Chest pain, unspecified: Secondary | ICD-10-CM

## 2018-03-09 MED ORDER — ESCITALOPRAM OXALATE 10 MG PO TABS: 10.0000 mg | ORAL_TABLET | Freq: Every day | ORAL | 1 refills | Status: DC

## 2018-03-09 MED ORDER — SERTRALINE HCL 50 MG PO TABS: ORAL_TABLET | ORAL | 0 refills | Status: DC

## 2018-03-09 MED ORDER — GABAPENTIN 600 MG PO TABS: 600.0000 mg | ORAL_TABLET | Freq: Two times a day (BID) | ORAL | 0 refills | Status: DC

## 2018-03-09 NOTE — Assessment & Plan Note (Addendum)
--  Tdap  Today ; will discuss Shingrix at the next opportunity -- CCS: Reports a colonoscopy in 2008 with Dr. Oletta Lamas (normal per report); due for cscope , not ready for it, alternatives d/w pt: elected iFOB --Female care: S/p hysterectomy for benign reason, no h/o abn PAPs, no recent PAPs .  Refer pt for MMG and gyn -(+) FH CAD --Labs: CMP, FLP, CBC, A1c, TSH, IFOB, vit D  -DEXA 07-2013 normal, recheck today

## 2018-03-09 NOTE — Progress Notes (Signed)
Subjective:    Patient ID: Taylor Silva, female    DOB: [birth year], 63 y.o.   MRN: [id number]  DOS:  03/09/2018 Type of visit - description: CPX Multiple concerns    Review of Systems 1 year history of depression, mostly related to her husband's health.  Symptoms are gradually worse.  No suicidal homicidal ideas.  Has not reach out for counseling.  Has not taking medication in years Also on and off headaches, started approximately 2 weeks ago, mostly posterior, "not the worst of my life". No associated nausea, vomited or light intolerance. Reports chest pain, on and off, going on for a year, episodes happen infrequently, about once a month.  They last few seconds, may or may not be related to exertion.  No associated shortness of breath or diaphoresis, some palpitations.  Other than above, a 14 point review of systems is negative     Past Medical History:  Diagnosis Date  . Arthritis   . Asthma   . Depression   . DJD (degenerative joint disease), lumbar   . Fructose intolerance   . GERD (gastroesophageal reflux disease)   . H/O hiatal hernia   . Hard of hearing    bilat   . Headache    migraines  . Heart murmur    echo done 80's neg  . History of bronchitis   . History of frequent urinary tract infections   . History of vertigo   . Hyperlipidemia   . Hypertension    currently taking no medications   . Obesity   . Pinched nerve    in back  . PONV (postoperative nausea and vomiting)    patch used last time  . Pre-diabetes   . Restless legs   . Seasonal allergies   . Sleep apnea    Intolerant to CPAP last sleep study  . Tinnitus   . Wears glasses     Past Surgical History:  Procedure Laterality Date  . ABDOMINAL HYSTERECTOMY  1995   No oophorectomy  . BREATH TEK H PYLORI N/A 06/23/2014   Procedure: BREATH TEK H PYLORI;  Surgeon: Greer Pickerel, MD;  Location: [facility];  Service: General;  Laterality: N/A;  . CHOLECYSTECTOMY N/A 07/12/2015   Procedure:  LAPAROSCOPIC CHOLECYSTECTOMY WITH INTRAOPERATIVE CHOLANGIOGRAM;  Surgeon: Greer Pickerel, MD;  Location: [facility];  Service: General;  Laterality: N/A;  . DIAGNOSTIC LAPAROSCOPY    . DILATION AND CURETTAGE OF UTERUS    . KNEE SURGERY  04/2009   right knee, meniscus tear  . LAPAROSCOPIC GASTRIC SLEEVE RESECTION WITH HIATAL HERNIA REPAIR N/A 10/03/2014   Procedure: LAPAROSCOPIC GASTRIC SLEEVE RESECTION WITH HIATAL HERNIA REPAIR W UPPER ENDO;  Surgeon: Greer Pickerel, MD;  Location: [facility];  Service: General;  Laterality: N/A;  . ORIF ANKLE FRACTURE Right 10/17/2013   Procedure: OPEN REDUCTION INTERNAL FIXATION (ORIF) RIGHT ANKLE DISTAL FIBULA/LATERAL MALLEOLUS;  Surgeon: Vickey Huger, MD;  Location: [place];  Service: Orthopedics;  Laterality: Right;  . OTHER SURGICAL HISTORY  1995?   removed scar tissue from colon  . thigh tuck Bilateral    inner thigh tuck  . UPPER GI ENDOSCOPY N/A 10/03/2014   Procedure: UPPER GI ENDOSCOPY;  Surgeon: Greer Pickerel, MD;  Location: [facility];  Service: General;  Laterality: N/A;  . WRIST SURGERY  1980s   cyst removed from right wrist   Family History  Problem Relation Age of Onset  . Diabetes Mother   . Hyperlipidemia Mother   . Hypertension Mother   .  Heart attack Mother        110 y/o  . Diabetes Sister   . Hyperlipidemia Sister   . Hypertension Sister   . Diabetes Brother   . Hyperlipidemia Brother   . Hypertension Brother   . Heart attack Brother   . Heart disease Father   . Leukemia Father   . Diabetes Maternal Grandmother   . Hyperlipidemia Maternal Grandmother   . Hypertension Maternal Grandmother   . Breast cancer Other        GM  . Colon cancer Neg Hx     Social History   Socioeconomic History  . Marital status: Married    Spouse name: Not on file  . Number of children: 1  . Years of education: Not on file  . Highest education level: Not on file  Occupational History  . Occupation: Holiday representative  Social Needs  . Financial  resource strain: Not on file  . Food insecurity:    Worry: Not on file    Inability: Not on file  . Transportation needs:    Medical: Not on file    Non-medical: Not on file  Tobacco Use  . Smoking status: Never Smoker  . Smokeless tobacco: Never Used  Substance and Sexual Activity  . Alcohol use: No  . Drug use: No  . Sexual activity: Not on file  Lifestyle  . Physical activity:    Days per week: Not on file    Minutes per session: Not on file  . Stress: Not on file  Relationships  . Social connections:    Talks on phone: Not on file    Gets together: Not on file    Attends religious service: Not on file    Active member of club or organization: Not on file    Attends meetings of clubs or organizations: Not on file    Relationship status: Not on file  . Intimate partner violence:    Fear of current or ex partner: Not on file    Emotionally abused: Not on file    Physically abused: Not on file    Forced sexual activity: Not on file  Other Topics Concern  . Not on file  Social History Narrative   Lives with husband who is transplant patient   Completed some college               Allergies as of 03/09/2018      Reactions   Codeine Anaphylaxis   Erythromycin Hives   Tetracyclines & Related Hives   Darvon [propoxyphene] Photosensitivity   Blurry vision for 3 days   Morphine And Related Dermatitis   "burning in veins"   Other Other (See Comments)   Tomatoes upset stomach, itchy   Penicillins Hives   Has patient had a PCN reaction causing immediate rash, facial/tongue/throat swelling, SOB or lightheadedness with hypotension: Yes Has patient had a PCN reaction causing severe rash involving mucus membranes or skin necrosis: No Has patient had a PCN reaction that required hospitalization No Has patient had a PCN reaction occurring within the last 10 years: No If all of the above answers are "NO", then may proceed with Cephalosporin use.   Sulfa Antibiotics Nausea And  Vomiting      Medication List       Accurate as of March 09, 2018 11:59 PM. Always use your most recent med list.        albuterol 108 (90 Base) MCG/ACT inhaler Commonly known as:  VENTOLIN HFA Inhale 2 puffs into the lungs every 6 (six) hours as needed for wheezing or shortness of breath.   azelastine 0.1 % nasal spray Commonly known as:  ASTELIN Place 2 sprays into both nostrils 2 (two) times daily. Use in each nostril as directed   CALCIUM CITRATE+D3 PETITES PO Take 6 tablets by mouth daily.   cetirizine 10 MG tablet Commonly known as:  ZYRTEC Take 10 mg by mouth daily.   FISH OIL PO Take 2 capsules by mouth 3 (three) times daily.   gabapentin 600 MG tablet Commonly known as:  NEURONTIN Take 1 tablet (600 mg total) by mouth 2 (two) times daily.   mometasone-formoterol 100-5 MCG/ACT Aero Commonly known as:  DULERA Inhale 2 puffs into the lungs 2 (two) times daily.   multivitamin with minerals tablet Take 2 tablets by mouth daily.   ranitidine 150 MG tablet Commonly known as:  ZANTAC Take 150 mg by mouth daily.   sertraline 50 MG tablet Commonly known as:  ZOLOFT 1/2 tablet a day x 1 week at night, then 1 tablet a day x 1 week, the 1.5 tablet a day   VITAMIN D (CHOLECALCIFEROL) PO Take 5,000 Units by mouth 3 (three) times daily.           Objective:   Physical Exam BP 132/84 (BP Location: Left Arm, Patient Position: Sitting, Cuff Size: Normal)   Pulse 80   Temp 98 F (36.7 C) (Oral)   Resp 16   Ht 5\' 6"  (1.676 m)   Wt 259 lb 6 oz (117.7 kg)   SpO2 98%   BMI 41.86 kg/m  General: Well developed, NAD, BMI noted Neck: No  thyromegaly  HEENT:  Normocephalic . Face symmetric, atraumatic Lungs:  CTA B Normal respiratory effort, no intercostal retractions, no accessory muscle use. Heart: RRR,  no murmur.  No pretibial edema bilaterally  Abdomen:  Not distended, soft, non-tender. No rebound or rigidity.   Skin: Exposed areas without rash. Not  pale. Not jaundice Neurologic:  alert & oriented X3.  Speech normal, gait appropriate for age and unassisted Strength symmetric and appropriate for age.  Psych: Cognition and judgment appear intact.  Cooperative with normal attention span and concentration.  Behavior appropriate. No anxious or depressed appearing.     Assessment     Assessment  Pre diabetes H/o HTN Dyslipidemia  Depression- prozac no help in the past, intolerant cymbalta d/t leg pain, lexapro nausea RLS, insomnia Asthma OSA CPAP intolerant GERD Headaches, migraines. Back pain, chronic on gabapentin Obesity, s/p bariatric surgery 09-2014 (Dr Redmond Pulling)  PLAN Pre DM: labs  Dyslipidemia: On no meds, checking labs Depression: Obviously exacerbated, PHQ 9 score 22 which is severe.  No suicidal ideas.  In the past Prozac did not help was intolerant to Cymbalta. Recommend Lexapro.  Reassess in 4 weeks Chest pain: Atypical, EKG today normal.  Observation Headache: Neurological exam normal, reassess once depression is treated. Multiple concerns, RTC 4 weeks.   Today, I spent > 40 minutes with the patient, more than 25 minutes were spent on issues not related to her physical exam including. The patient was depressed, I counselor, reviewed the chart, prescribed medication.  She also had chest pain, headache.  Multiple questions were answered.

## 2018-03-09 NOTE — Patient Instructions (Addendum)
GO TO THE LAB : Get the blood work     GO TO THE FRONT DESK Schedule your next appointment in 4 weeks  Start sertraline   See a counselor   Call if the headaches are worse ER if severe or persistent chest pain  SUICIDE PREVENTION:  Find Help 24/7, [place] Call our 24-hour HelpLine at [phone] or [place]: 1-800-SUICIDE   The National Suicide Prevention Lifeline: [employer]

## 2018-03-09 NOTE — Progress Notes (Signed)
Pre visit review using our clinic review tool, if applicable. No additional management support is needed unless otherwise documented below in the visit note. 

## 2018-03-10 LAB — CBC WITH DIFFERENTIAL/PLATELET
BASOS ABS: 0.1 10*3/uL (ref 0.0–0.1)
Basophils Relative: 1.3 % (ref 0.0–3.0)
EOS ABS: 0.1 10*3/uL (ref 0.0–0.7)
Eosinophils Relative: 1.8 % (ref 0.0–5.0)
HCT: 40.6 % (ref 36.0–46.0)
HEMOGLOBIN: 13.5 g/dL (ref 12.0–15.0)
LYMPHS PCT: 46.4 % — AB (ref 12.0–46.0)
Lymphs Abs: 3.2 10*3/uL (ref 0.7–4.0)
MCHC: 33.4 g/dL (ref 30.0–36.0)
MCV: 91.5 fl (ref 78.0–100.0)
Monocytes Absolute: 0.4 10*3/uL (ref 0.1–1.0)
Monocytes Relative: 6.6 % (ref 3.0–12.0)
Neutro Abs: 3 10*3/uL (ref 1.4–7.7)
Neutrophils Relative %: 43.9 % (ref 43.0–77.0)
PLATELETS: 228 10*3/uL (ref 150.0–400.0)
RBC: 4.44 Mil/uL (ref 3.87–5.11)
RDW: 13 % (ref 11.5–15.5)
WBC: 6.8 10*3/uL (ref 4.0–10.5)

## 2018-03-10 LAB — COMPREHENSIVE METABOLIC PANEL
ALK PHOS: 86 U/L (ref 39–117)
ALT: 16 U/L (ref 0–35)
AST: 20 U/L (ref 0–37)
Albumin: 4.3 g/dL (ref 3.5–5.2)
BILIRUBIN TOTAL: 0.6 mg/dL (ref 0.2–1.2)
BUN: 23 mg/dL (ref 6–23)
CO2: 29 mEq/L (ref 19–32)
Calcium: 9.4 mg/dL (ref 8.4–10.5)
Chloride: 100 mEq/L (ref 96–112)
Creatinine, Ser: 0.76 mg/dL (ref 0.40–1.20)
GFR: 77.21 mL/min (ref >60.00)
GLUCOSE: 86 mg/dL (ref 70–99)
Potassium: 4.2 mEq/L (ref 3.5–5.1)
Sodium: 138 mEq/L (ref 135–145)
Total Protein: 6.9 g/dL (ref 6.0–8.3)

## 2018-03-10 LAB — VITAMIN D 25 HYDROXY (VIT D DEFICIENCY, FRACTURES): VITD: 96.84 ng/mL (ref 30.00–100.00)

## 2018-03-10 LAB — LIPID PANEL
CHOL/HDL RATIO: 3
Cholesterol: 230 mg/dL — ABNORMAL HIGH (ref 0–200)
HDL: 70.6 mg/dL (ref >39.00)
LDL CALC: 142 mg/dL — AB (ref 0–99)
NonHDL: 159.15
Triglycerides: 86 mg/dL (ref 0.0–149.0)
VLDL: 17.2 mg/dL (ref 0.0–40.0)

## 2018-03-10 LAB — TSH: TSH: 1.84 u[IU]/mL (ref 0.35–4.50)

## 2018-03-10 LAB — HEMOGLOBIN A1C: HEMOGLOBIN A1C: 5.7 % (ref 4.6–6.5)

## 2018-03-10 NOTE — Assessment & Plan Note (Signed)
Pre DM: labs  Dyslipidemia: On no meds, checking labs Depression: Obviously exacerbated, PHQ 9 score 22 which is severe.  No suicidal ideas.  In the past Prozac did not help was intolerant to Cymbalta. Recommend Lexapro.  Reassess in 4 weeks Chest pain: Atypical, EKG today normal.  Observation Headache: Neurological exam normal, reassess once depression is treated. Multiple concerns, RTC 4 weeks.

## 2018-03-27 ENCOUNTER — Inpatient Hospital Stay (HOSPITAL_BASED_OUTPATIENT_CLINIC_OR_DEPARTMENT_OTHER): Admission: RE | Admit: 2018-03-27 | Payer: Self-pay | Source: Ambulatory Visit

## 2018-03-29 ENCOUNTER — Other Ambulatory Visit (HOSPITAL_BASED_OUTPATIENT_CLINIC_OR_DEPARTMENT_OTHER): Payer: Self-pay

## 2018-03-29 ENCOUNTER — Ambulatory Visit (HOSPITAL_BASED_OUTPATIENT_CLINIC_OR_DEPARTMENT_OTHER): Payer: Self-pay

## 2018-03-30 ENCOUNTER — Ambulatory Visit: Payer: Self-pay | Admitting: Internal Medicine

## 2018-04-11 ENCOUNTER — Other Ambulatory Visit: Payer: Self-pay | Admitting: Internal Medicine

## 2018-04-12 ENCOUNTER — Ambulatory Visit (INDEPENDENT_AMBULATORY_CARE_PROVIDER_SITE_OTHER): Payer: Managed Care, Other (non HMO) | Admitting: Internal Medicine

## 2018-04-12 ENCOUNTER — Other Ambulatory Visit: Payer: Self-pay

## 2018-04-12 DIAGNOSIS — F329 Major depressive disorder, single episode, unspecified: Secondary | ICD-10-CM

## 2018-04-12 DIAGNOSIS — J302 Other seasonal allergic rhinitis: Secondary | ICD-10-CM | POA: Diagnosis not present

## 2018-04-12 DIAGNOSIS — F32A Depression, unspecified: Secondary | ICD-10-CM

## 2018-04-12 MED ORDER — AZELASTINE HCL 0.1 % NA SOLN: 2.0000 | Freq: Two times a day (BID) | NASAL | 6 refills | Status: DC

## 2018-04-12 MED ORDER — ALBUTEROL SULFATE 108 (90 BASE) MCG/ACT IN AEPB: 2.0000 | INHALATION_SPRAY | Freq: Four times a day (QID) | RESPIRATORY_TRACT | 3 refills | Status: AC | PRN

## 2018-04-12 NOTE — Progress Notes (Signed)
Subjective:    Patient ID: Taylor Silva, female    DOB: 10/24/1955, 63 y.o.   MRN: [id number]  DOS:  04/12/2018 Type of visit - description:  Virtual Visit via Video Note  I connected with@ on 04/12/18 at 11:00 AM EDT by a video enabled telemedicine application and verified that I am speaking with the correct person using two identifiers.   THIS ENCOUNTER IS A VIRTUAL VISIT DUE TO COVID-19 - PATIENT WAS NOT SEEN IN THE OFFICE. PATIENT HAS CONSENTED TO VIRTUAL VISIT / TELEMEDICINE VISIT   Location of patient: home  Location of provider: office  I discussed the limitations of evaluation and management by telemedicine and the availability of in person appointments. The patient expressed understanding and agreed to proceed.  History of Present Illness: Follow-up from previous visit At the last visit, she was diagnosed with depression exacerbation, PHQ 9 was 22.  I recommended initially Lexapro but then she was switched to sertraline.  It is working well for her, mood has improved.  Review of Systems Denies fever chills. She reports allergies: Occasional headache, sore throat, postnasal dripping and occasional cough. Denies chest pain or difficulty breathing.    Past Medical History:  Diagnosis Date  . Arthritis   . Asthma   . Depression   . DJD (degenerative joint disease), lumbar   . Fructose intolerance   . GERD (gastroesophageal reflux disease)   . H/O hiatal hernia   . Hard of hearing    bilat   . Headache    migraines  . Heart murmur    echo done 80's neg  . History of bronchitis   . History of frequent urinary tract infections   . History of vertigo   . Hyperlipidemia   . Hypertension    currently taking no medications   . Obesity   . Pinched nerve    in back  . PONV (postoperative nausea and vomiting)    patch used last time  . Pre-diabetes   . Restless legs   . Seasonal allergies   . Sleep apnea    Intolerant to CPAP last sleep study  . Tinnitus   .  Wears glasses     Past Surgical History:  Procedure Laterality Date  . ABDOMINAL HYSTERECTOMY  1995   No oophorectomy  . BREATH TEK H PYLORI N/A 06/23/2014   Procedure: BREATH TEK H PYLORI;  Surgeon: Greer Pickerel, MD;  Location: [facility];  Service: General;  Laterality: N/A;  . CHOLECYSTECTOMY N/A 07/12/2015   Procedure: LAPAROSCOPIC CHOLECYSTECTOMY WITH INTRAOPERATIVE CHOLANGIOGRAM;  Surgeon: Greer Pickerel, MD;  Location: [facility];  Service: General;  Laterality: N/A;  . DIAGNOSTIC LAPAROSCOPY    . DILATION AND CURETTAGE OF UTERUS    . KNEE SURGERY  04/2009   right knee, meniscus tear  . LAPAROSCOPIC GASTRIC SLEEVE RESECTION WITH HIATAL HERNIA REPAIR N/A 10/03/2014   Procedure: LAPAROSCOPIC GASTRIC SLEEVE RESECTION WITH HIATAL HERNIA REPAIR W UPPER ENDO;  Surgeon: Greer Pickerel, MD;  Location: [facility];  Service: General;  Laterality: N/A;  . ORIF ANKLE FRACTURE Right 10/17/2013   Procedure: OPEN REDUCTION INTERNAL FIXATION (ORIF) RIGHT ANKLE DISTAL FIBULA/LATERAL MALLEOLUS;  Surgeon: Vickey Huger, MD;  Location: [place];  Service: Orthopedics;  Laterality: Right;  . OTHER SURGICAL HISTORY  1995?   removed scar tissue from colon  . thigh tuck Bilateral    inner thigh tuck  . UPPER GI ENDOSCOPY N/A 10/03/2014   Procedure: UPPER GI ENDOSCOPY;  Surgeon: Greer Pickerel, MD;  Location: [facility];  Service: General;  Laterality: N/A;  . WRIST SURGERY  1980s   cyst removed from right wrist    Social History   Socioeconomic History  . Marital status: Married    Spouse name: Not on file  . Number of children: 1  . Years of education: Not on file  . Highest education level: Not on file  Occupational History  . Occupation: Holiday representative  Social Needs  . Financial resource strain: Not on file  . Food insecurity:    Worry: Not on file    Inability: Not on file  . Transportation needs:    Medical: Not on file    Non-medical: Not on file  Tobacco Use  . Smoking status: Never Smoker  .  Smokeless tobacco: Never Used  Substance and Sexual Activity  . Alcohol use: No  . Drug use: No  . Sexual activity: Not on file  Lifestyle  . Physical activity:    Days per week: Not on file    Minutes per session: Not on file  . Stress: Not on file  Relationships  . Social connections:    Talks on phone: Not on file    Gets together: Not on file    Attends religious service: Not on file    Active member of club or organization: Not on file    Attends meetings of clubs or organizations: Not on file    Relationship status: Not on file  . Intimate partner violence:    Fear of current or ex partner: Not on file    Emotionally abused: Not on file    Physically abused: Not on file    Forced sexual activity: Not on file  Other Topics Concern  . Not on file  Social History Narrative   Lives with husband who is transplant patient   Completed some college               Allergies as of 04/12/2018      Reactions   Codeine Anaphylaxis   Erythromycin Hives   Tetracyclines & Related Hives   Darvon [propoxyphene] Photosensitivity   Blurry vision for 3 days   Morphine And Related Dermatitis   "burning in veins"   Other Other (See Comments)   Tomatoes upset stomach, itchy   Penicillins Hives   Has patient had a PCN reaction causing immediate rash, facial/tongue/throat swelling, SOB or lightheadedness with hypotension: Yes Has patient had a PCN reaction causing severe rash involving mucus membranes or skin necrosis: No Has patient had a PCN reaction that required hospitalization No Has patient had a PCN reaction occurring within the last 10 years: No If all of the above answers are "NO", then may proceed with Cephalosporin use.   Sulfa Antibiotics Nausea And Vomiting      Medication List       Accurate as of April 12, 2018 11:00 AM. Always use your most recent med list.        albuterol 108 (90 Base) MCG/ACT inhaler Commonly known as:  Ventolin HFA Inhale 2 puffs into the  lungs every 6 (six) hours as needed for wheezing or shortness of breath.   azelastine 0.1 % nasal spray Commonly known as:  ASTELIN Place 2 sprays into both nostrils 2 (two) times daily. Use in each nostril as directed   CALCIUM CITRATE+D3 PETITES PO Take 6 tablets by mouth daily.   cetirizine 10 MG tablet Commonly known as:  ZYRTEC Take 10 mg by  mouth daily.   FISH OIL PO Take 2 capsules by mouth 3 (three) times daily.   gabapentin 600 MG tablet Commonly known as:  NEURONTIN Take 1 tablet (600 mg total) by mouth 2 (two) times daily.   mometasone-formoterol 100-5 MCG/ACT Aero Commonly known as:  Dulera Inhale 2 puffs into the lungs 2 (two) times daily.   multivitamin with minerals tablet Take 2 tablets by mouth daily.   ranitidine 150 MG tablet Commonly known as:  ZANTAC Take 150 mg by mouth daily.   sertraline 50 MG tablet Commonly known as:  ZOLOFT Take 1.5 tablets (75 mg total) by mouth daily.   VITAMIN D (CHOLECALCIFEROL) PO Take 5,000 Units by mouth 3 (three) times daily.           Objective:   Physical Exam There were no vitals taken for this visit. 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 Back pain, chronic on gabapentin Obesity, s/p bariatric surgery 09-2014 (Dr Redmond Pulling)  PLAN Depression: See last visit, depression was exacerbated, initially prescribed Lexapro but then she was switched to sertraline.  Medication is working, she has occasionally a bad day but overall well controlled. We agreed on no change, RF for sertraline sent.  Will call for the next refill when needed. Allergies: Has a long history of allergies, currently with occasional sore throat and cough but no fever or chills.  Currently on Flonase and Zyrtec, reports Zyrtec "stopped helping". Recommend to switch to Allegra. Also will send a prescription for Astelin and  albuterol which helped in the past. Coronavirus pandemia: Report is following all the CDC recommendations regards prevention, praised! RTC 6 months, the patient will call in 5 months to set up an appointment.    I discussed the assessment and treatment plan with the patient. The patient was provided an opportunity to ask questions and all were answered. The patient agreed with the plan and demonstrated an understanding of the instructions.   The patient was advised to call back or seek an in-person evaluation if the symptoms worsen or if the condition fails to improve as anticipated.

## 2018-04-14 NOTE — Assessment & Plan Note (Signed)
PLAN Depression: See last visit, depression was exacerbated, initially prescribed Lexapro but then she was switched to sertraline.  Medication is working, she has occasionally a bad day but overall well controlled. We agreed on no change, RF for sertraline sent.  Will call for the next refill when needed. Allergies: Has a long history of allergies, currently with occasional sore throat and cough but no fever or chills. Currently on Flonase and Zyrtec, reports Zyrtec "stopped helping". Recommend to switch to Allegra. Also will send a prescription for Astelin and  albuterol which helped in the past. Coronavirus pandemia: Report is following all the CDC recommendations regards prevention, praised! RTC 6 months, the patient will call in 5 months to set up an appointment.

## 2018-10-18 ENCOUNTER — Other Ambulatory Visit: Payer: Self-pay | Admitting: Internal Medicine

## 2019-01-13 ENCOUNTER — Other Ambulatory Visit: Payer: Self-pay | Admitting: Internal Medicine

## 2019-01-14 NOTE — Telephone Encounter (Signed)
Last OV 04/12/18 Last refill 10/18/18 # 135/0 Next OV not scheduled

## 2019-01-15 ENCOUNTER — Other Ambulatory Visit: Payer: Self-pay | Admitting: Internal Medicine

## 2019-02-21 ENCOUNTER — Ambulatory Visit (INDEPENDENT_AMBULATORY_CARE_PROVIDER_SITE_OTHER): Payer: Managed Care, Other (non HMO) | Admitting: Family

## 2019-02-21 ENCOUNTER — Other Ambulatory Visit: Payer: Self-pay

## 2019-02-21 ENCOUNTER — Ambulatory Visit: Payer: Managed Care, Other (non HMO) | Admitting: Family Medicine

## 2019-02-21 ENCOUNTER — Encounter: Payer: Self-pay | Admitting: Family

## 2019-02-21 ENCOUNTER — Encounter: Payer: Self-pay | Admitting: Family Medicine

## 2019-02-21 VITALS — BP 127/79 | HR 77 | Temp 96.4°F | Resp 16 | Ht 67.0 in | Wt 248.0 lb

## 2019-02-21 DIAGNOSIS — R5381 Other malaise: Secondary | ICD-10-CM | POA: Diagnosis not present

## 2019-02-21 DIAGNOSIS — R519 Headache, unspecified: Secondary | ICD-10-CM

## 2019-02-21 DIAGNOSIS — G4733 Obstructive sleep apnea (adult) (pediatric): Secondary | ICD-10-CM

## 2019-02-21 DIAGNOSIS — I10 Essential (primary) hypertension: Secondary | ICD-10-CM

## 2019-02-21 DIAGNOSIS — R739 Hyperglycemia, unspecified: Secondary | ICD-10-CM

## 2019-02-21 DIAGNOSIS — E785 Hyperlipidemia, unspecified: Secondary | ICD-10-CM

## 2019-02-21 DIAGNOSIS — G8929 Other chronic pain: Secondary | ICD-10-CM

## 2019-02-21 DIAGNOSIS — Z9884 Bariatric surgery status: Secondary | ICD-10-CM | POA: Diagnosis not present

## 2019-02-21 DIAGNOSIS — J329 Chronic sinusitis, unspecified: Secondary | ICD-10-CM | POA: Insufficient documentation

## 2019-02-21 DIAGNOSIS — F32A Depression, unspecified: Secondary | ICD-10-CM

## 2019-02-21 DIAGNOSIS — F329 Major depressive disorder, single episode, unspecified: Secondary | ICD-10-CM

## 2019-02-21 MED ORDER — CIPROFLOXACIN HCL 250 MG PO TABS: 250.0000 mg | ORAL_TABLET | Freq: Two times a day (BID) | ORAL | 0 refills | Status: DC

## 2019-02-21 NOTE — Assessment & Plan Note (Signed)
Well controlled, no changes to meds. Encouraged heart healthy diet such as the DASH diet and exercise as tolerated.  °

## 2019-02-21 NOTE — Progress Notes (Signed)
Subjective:    Patient ID: Taylor Silva, female    DOB: 07/01/1955, 64 y.o.   MRN: [id number]  Chief Complaint  Patient presents with  . Dizziness    Patient is connecting C/O dizziness since 2.11.21. She has also had headaches and nausea without vomiting. She has had H/A'es biweekly for several months but this H/A has been constant since the 11th. She has tinnitis so she cannot tell if she has sound sensitivity. She was so dizzy yesterday that couldn't hardly walk. Has felt like there was fluid in her ears 2-weeks-ago and she used swimmers ear drops. She did feel that someone was squeezing her head. Had COVID test was negative. The pressure in her  . addendum    head is not as bad but it is still there.  She has Tx with a box of Mucinex which she ran out last Monday. After a protein shake her BS was 127.FYI: She weaned herself off of Sertraline and stopped yesterday.    HPI Patient is in today for evaluation of numerous concerns. She was seen earlier int he day virtually and due to her neurologic complaints she was asked to come in for evaluation. Her headache and dizziness are some better but have been present for days. Patient with a long history of chronic headaches and recurrent sinusitis notes for the last 5 days she has had a persistent headache she notes pressure over her sinuses as well as pain into the back of her head most notably on the right.  She is noting some ringing in her ears and fullness as well she has chronic nasal congestion and runny nose.  She notes she has been taking Sudafed Excedrin Migraine and more with minimal relief.  No fevers or chills except for some mild subjective episodes..  She describes a sensation of dizziness intermittently but none presently. No other neurologic concerns. Denies CP/palp/SOB/GI or GU c/o. Taking meds as prescribed  Past Medical History:  Diagnosis Date  . Arthritis   . Asthma   . Depression   . DJD (degenerative joint disease),  lumbar   . Fructose intolerance   . GERD (gastroesophageal reflux disease)   . H/O hiatal hernia   . Hard of hearing    bilat   . Headache    migraines  . Heart murmur    echo done 80's neg  . History of bronchitis   . History of frequent urinary tract infections   . History of vertigo   . Hyperlipidemia   . Hypertension    currently taking no medications   . Obesity   . Pinched nerve    in back  . PONV (postoperative nausea and vomiting)    patch used last time  . Pre-diabetes   . Restless legs   . Seasonal allergies   . Sleep apnea    Intolerant to CPAP last sleep study  . Tinnitus   . Wears glasses     Past Surgical History:  Procedure Laterality Date  . ABDOMINAL HYSTERECTOMY  1995   No oophorectomy  . BREATH TEK H PYLORI N/A 06/23/2014   Procedure: BREATH TEK H PYLORI;  Surgeon: Greer Pickerel, MD;  Location: [facility];  Service: General;  Laterality: N/A;  . CHOLECYSTECTOMY N/A 07/12/2015   Procedure: LAPAROSCOPIC CHOLECYSTECTOMY WITH INTRAOPERATIVE CHOLANGIOGRAM;  Surgeon: Greer Pickerel, MD;  Location: [facility];  Service: General;  Laterality: N/A;  . DIAGNOSTIC LAPAROSCOPY    . DILATION AND CURETTAGE OF UTERUS    .  KNEE SURGERY  04/2009   right knee, meniscus tear  . LAPAROSCOPIC GASTRIC SLEEVE RESECTION WITH HIATAL HERNIA REPAIR N/A 10/03/2014   Procedure: LAPAROSCOPIC GASTRIC SLEEVE RESECTION WITH HIATAL HERNIA REPAIR W UPPER ENDO;  Surgeon: Greer Pickerel, MD;  Location: [facility];  Service: General;  Laterality: N/A;  . ORIF ANKLE FRACTURE Right 10/17/2013   Procedure: OPEN REDUCTION INTERNAL FIXATION (ORIF) RIGHT ANKLE DISTAL FIBULA/LATERAL MALLEOLUS;  Surgeon: Vickey Huger, MD;  Location: [facility];  Service: Orthopedics;  Laterality: Right;  . OTHER SURGICAL HISTORY  1995?   removed scar tissue from colon  . thigh tuck Bilateral    inner thigh tuck  . UPPER GI ENDOSCOPY N/A 10/03/2014   Procedure: UPPER GI ENDOSCOPY;  Surgeon: Greer Pickerel, MD;  Location: [facility];  Service:  General;  Laterality: N/A;  . WRIST SURGERY  1980s   cyst removed from right wrist    Family History  Problem Relation Age of Onset  . Diabetes Mother   . Hyperlipidemia Mother   . Hypertension Mother   . Heart attack Mother        53 y/o  . Diabetes Sister   . Hyperlipidemia Sister   . Hypertension Sister   . Diabetes Brother   . Hyperlipidemia Brother   . Hypertension Brother   . Heart attack Brother   . Heart disease Father   . Leukemia Father   . Diabetes Maternal Grandmother   . Hyperlipidemia Maternal Grandmother   . Hypertension Maternal Grandmother   . Breast cancer Other        GM  . Colon cancer Neg Hx     Social History   Socioeconomic History  . Marital status: Married    Spouse name: Not on file  . Number of children: 1  . Years of education: Not on file  . Highest education level: Not on file  Occupational History  . Occupation: Quality control ccordinator  Tobacco Use  . Smoking status: Never Smoker  . Smokeless tobacco: Never Used  Substance and Sexual Activity  . Alcohol use: No  . Drug use: No  . Sexual activity: Not on file  Other Topics Concern  . Not on file  Social History Narrative   Lives with husband who is transplant patient   Completed some college            Social Determinants of Health   Financial Resource Strain:   . Difficulty of Paying Living Expenses: Not on file  Food Insecurity:   . Worried About Charity fundraiser in the Last Year: Not on file  . Ran Out of Food in the Last Year: Not on file  Transportation Needs:   . Lack of Transportation (Medical): Not on file  . Lack of Transportation (Non-Medical): Not on file  Physical Activity:   . Days of Exercise per Week: Not on file  . Minutes of Exercise per Session: Not on file  Stress:   . Feeling of Stress : Not on file  Social Connections:   . Frequency of Communication with Friends and Family: Not on file  . Frequency of Social Gatherings with Friends and  Family: Not on file  . Attends Religious Services: Not on file  . Active Member of Clubs or Organizations: Not on file  . Attends Archivist Meetings: Not on file  . Marital Status: Not on file  Intimate Partner Violence:   . Fear of Current or Ex-Partner: Not on file  . Emotionally  Abused: Not on file  . Physically Abused: Not on file  . Sexually Abused: Not on file    Outpatient Medications Prior to Visit  Medication Sig Dispense Refill  . Albuterol Sulfate (PROAIR RESPICLICK) 123XX123 (90 Base) MCG/ACT AEPB Inhale 2 puffs into the lungs 4 (four) times daily as needed. 1 each 3  . azelastine (ASTELIN) 0.1 % nasal spray Place 2 sprays into both nostrils 2 (two) times daily. Use in each nostril as directed 30 mL 6  . Calcium Citrate-Vitamin D (CALCIUM CITRATE+D3 PETITES PO) Take 6 tablets by mouth daily.    . cetirizine (ZYRTEC) 10 MG tablet Take 10 mg by mouth daily.    Marland Kitchen gabapentin (NEURONTIN) 600 MG tablet Take 1 tablet (600 mg total) by mouth 2 (two) times daily. Needs ov 60 tablet 0  . Multiple Vitamins-Minerals (MULTIVITAMIN WITH MINERALS) tablet Take 2 tablets by mouth daily.     . Omega-3 Fatty Acids (FISH OIL PO) Take 1 capsule by mouth daily.     Marland Kitchen VITAMIN D, CHOLECALCIFEROL, PO Take 5,000 Units by mouth 2 (two) times daily.      No facility-administered medications prior to visit.    Allergies  Allergen Reactions  . Codeine Anaphylaxis  . Erythromycin Hives  . Tetracyclines & Related Hives  . Darvon [Propoxyphene] Photosensitivity    Blurry vision for 3 days  . Morphine And Related Dermatitis    "burning in veins"  . Other Other (See Comments)    Tomatoes upset stomach, itchy    . Penicillins Hives    Has patient had a PCN reaction causing immediate rash, facial/tongue/throat swelling, SOB or lightheadedness with hypotension: Yes Has patient had a PCN reaction causing severe rash involving mucus membranes or skin necrosis: No Has patient had a PCN reaction  that required hospitalization No Has patient had a PCN reaction occurring within the last 10 years: No If all of the above answers are "NO", then may proceed with Cephalosporin use.   . Sulfa Antibiotics Nausea And Vomiting    Review of Systems  Constitutional: Positive for chills, fever and malaise/fatigue.  HENT: Negative for congestion.   Eyes: Negative for blurred vision.  Respiratory: Negative for shortness of breath.   Cardiovascular: Negative for chest pain, palpitations and leg swelling.  Gastrointestinal: Negative for abdominal pain, blood in stool and nausea.  Genitourinary: Negative for dysuria and frequency.  Musculoskeletal: Negative for falls.  Skin: Negative for rash.  Neurological: Negative for dizziness, loss of consciousness and headaches.  Endo/Heme/Allergies: Negative for environmental allergies.  Psychiatric/Behavioral: Negative for depression. The patient is not nervous/anxious.        Objective:    Physical Exam Vitals and nursing note reviewed.  Constitutional:      General: She is not in acute distress.    Appearance: She is well-developed.  HENT:     Head: Normocephalic and atraumatic.     Right Ear: Tympanic membrane, ear canal and external ear normal. There is no impacted cerumen.     Left Ear: Tympanic membrane, ear canal and external ear normal. There is no impacted cerumen.     Nose: Nose normal.  Eyes:     General:        Right eye: No discharge.        Left eye: No discharge.  Cardiovascular:     Rate and Rhythm: Normal rate and regular rhythm.     Heart sounds: No murmur.  Pulmonary:     Effort: Pulmonary effort is  normal.     Breath sounds: Normal breath sounds.  Abdominal:     General: Bowel sounds are normal.     Palpations: Abdomen is soft.     Tenderness: There is no abdominal tenderness.  Musculoskeletal:     Cervical back: Normal range of motion and neck supple.  Skin:    General: Skin is warm and dry.  Neurological:      Mental Status: She is alert and oriented to person, place, and time.     BP 127/79 (BP Location: Left Arm, Patient Position: Sitting, Cuff Size: Large)   Pulse 77   Temp (!) 96.4 F (35.8 C) (Temporal)   Resp 16   Ht 5\' 7"  (1.702 m)   Wt 248 lb (112.5 kg)   SpO2 100%   BMI 38.84 kg/m  Wt Readings from Last 3 Encounters:  02/21/19 248 lb (112.5 kg)  03/09/18 259 lb 6 oz (117.7 kg)  01/16/17 237 lb 2 oz (107.6 kg)    Diabetic Foot Exam - Simple   No data filed     Lab Results  Component Value Date   WBC 6.8 03/09/2018   HGB 13.5 03/09/2018   HCT 40.6 03/09/2018   PLT 228.0 03/09/2018   GLUCOSE 86 03/09/2018   CHOL 230 (H) 03/09/2018   TRIG 86.0 03/09/2018   HDL 70.60 03/09/2018   LDLCALC 142 (H) 03/09/2018   ALT 16 03/09/2018   AST 20 03/09/2018   NA 138 03/09/2018   K 4.2 03/09/2018   CL 100 03/09/2018   CREATININE 0.76 03/09/2018   BUN 23 03/09/2018   CO2 29 03/09/2018   TSH 1.84 03/09/2018   HGBA1C 5.7 03/09/2018    Lab Results  Component Value Date   TSH 1.84 03/09/2018   Lab Results  Component Value Date   WBC 6.8 03/09/2018   HGB 13.5 03/09/2018   HCT 40.6 03/09/2018   MCV 91.5 03/09/2018   PLT 228.0 03/09/2018   Lab Results  Component Value Date   NA 138 03/09/2018   K 4.2 03/09/2018   CO2 29 03/09/2018   GLUCOSE 86 03/09/2018   BUN 23 03/09/2018   CREATININE 0.76 03/09/2018   BILITOT 0.6 03/09/2018   ALKPHOS 86 03/09/2018   AST 20 03/09/2018   ALT 16 03/09/2018   PROT 6.9 03/09/2018   ALBUMIN 4.3 03/09/2018   CALCIUM 9.4 03/09/2018   ANIONGAP 6 07/11/2015   GFR 77.21 03/09/2018   Lab Results  Component Value Date   CHOL 230 (H) 03/09/2018   Lab Results  Component Value Date   HDL 70.60 03/09/2018   Lab Results  Component Value Date   LDLCALC 142 (H) 03/09/2018   Lab Results  Component Value Date   TRIG 86.0 03/09/2018   Lab Results  Component Value Date   CHOLHDL 3 03/09/2018   Lab Results  Component Value Date    HGBA1C 5.7 03/09/2018       Assessment & Plan:   Problem List Items Addressed This Visit    Depression    She has been trying to titrate off of her sertraline but was only at 50 mg so noot on an excessively high dose so less likely to be contributing to her current symptoms      OSA (obstructive sleep apnea)   Hyperglycemia - Primary    minimize simple carbs. Increase exercise as tolerated.       Relevant Orders   Hemoglobin A1c   Amb ref to Medical Nutrition  Therapy-MNT   Hypertension    Well controlled, no changes to meds. Encouraged heart healthy diet such as the DASH diet and exercise as tolerated.       Relevant Orders   CBC with Differential/Platelet   Comprehensive metabolic panel   TSH   Amb ref to Medical Nutrition Therapy-MNT   Hyperlipidemia    Encouraged heart healthy diet, increase exercise, avoid trans fats, consider a krill oil cap daily      Relevant Orders   Amb ref to Medical Nutrition Therapy-MNT   S/P laparoscopic sleeve gastrectomy    Due to recent worsening symptoms will check labs including vitamin levels      Headache    Patient with a long history of chronic headaches and recurrent sinusitis notes for the last 5 days she has had a persistent headache she notes pressure over her sinuses as well as pain into the back of her head most notably on the right.  She is noting some ringing in her ears and fullness as well she has chronic nasal congestion and runny nose.  She notes she has been taking Sudafed Excedrin Migraine and more with minimal relief.  No fevers or chills.  She describes a sensation of dizziness intermittently but none presently.  She is referred to neurology for evaluation of chronic headaches.  Her neurologic exam is normal today so we will hold off on imaging at this time.  She is encouraged to hydrate better get plenty of rest try and exercise and we will treat sinus infection to see if that is helpful as well as check lab work.       Relevant Orders   Ambulatory referral to Neurology   Sinusitis    Encouraged to avoid sudafed which could be contributing to headaches. Given Ciprofloxacin as she has allergies to many antibiotics and asked to take probiotics and Mucinex also.       Relevant Medications   ciprofloxacin (CIPRO) 250 MG tablet    Other Visit Diagnoses    S/P gastric bypass       Relevant Orders   Vitamin B12   Magnesium   Amb ref to Medical Nutrition Therapy-MNT      I am having Taylor Silva start on ciprofloxacin. I am also having her maintain her multivitamin with minerals, Calcium Citrate-Vitamin D (CALCIUM CITRATE+D3 PETITES PO), Omega-3 Fatty Acids (FISH OIL PO), cetirizine, (VITAMIN D, CHOLECALCIFEROL, PO), azelastine, Albuterol Sulfate, and gabapentin.  Meds ordered this encounter  Medications  . ciprofloxacin (CIPRO) 250 MG tablet    Sig: Take 1 tablet (250 mg total) by mouth 2 (two) times daily.    Dispense:  14 tablet    Refill:  0     Penni Homans, MD

## 2019-02-21 NOTE — Assessment & Plan Note (Signed)
Encouraged heart healthy diet, increase exercise, avoid trans fats, consider a krill oil cap daily 

## 2019-02-21 NOTE — Assessment & Plan Note (Signed)
Encouraged to avoid sudafed which could be contributing to headaches. Given Ciprofloxacin as she has allergies to many antibiotics and asked to take probiotics and Mucinex also.

## 2019-02-21 NOTE — Assessment & Plan Note (Signed)
Due to recent worsening symptoms will check labs including vitamin levels

## 2019-02-21 NOTE — Assessment & Plan Note (Addendum)
Patient with a long history of chronic headaches and recurrent sinusitis notes for the last 5 days she has had a persistent headache she notes pressure over her sinuses as well as pain into the back of her head most notably on the right.  She is noting some ringing in her ears and fullness as well she has chronic nasal congestion and runny nose.  She notes she has been taking Sudafed Excedrin Migraine and more with minimal relief.  No fevers or chills except for possible mild subjective episodes.  She describes a sensation of dizziness intermittently but none presently.  She is referred to neurology for evaluation of chronic headaches.  Her neurologic exam is normal today so we will hold off on imaging at this time.  She is encouraged to hydrate better get plenty of rest try and exercise and we will treat sinus infection to see if that is helpful as well as check lab work.

## 2019-02-21 NOTE — Progress Notes (Signed)
Virtual Visit via Video Note  I connected with Taylor Silva on 02/21/19 at 11:20 AM EST by a video enabled telemedicine application and verified that I am speaking with the correct person using two identifiers.  Location: Patient: home Provider: home   I discussed the limitations of evaluation and management by telemedicine and the availability of in person appointments. The patient expressed understanding and agreed to proceed.  History of Present Illness:  Patient reports that she woke up yesterday AM with dizziness, "could hardly walk." Notes that she has been having a lot of headaches.  HA's have been happening daily.  Notes only mild dizziness today.   She reports today's HA is 4/10.  Reports a general malaise.  Notes mild nausea without vomiting. Reports that she is tolerating PO's. Reports that she stayed in the bed most of the day yesterday  She has a hx of gastric sleeve in 2016. Reports that since that time she has had issues with hypoglycemia and wonders if this could be a contributing factor. Reports that she drinks a protein shake every day to help prevent hypoglycemia. She denies associated numbness/weakness, slurred speech or facial drooping.    Depression- She states that she did not find any improvement in her symptoms with the sertraline and so she decided to wean herself off of sertraline. She reports that she tapered off from 50mg  for 1.5 weeks then 25mg  for 1.5 weeks.  Last night was her first night without a dose of medication.     Wt Readings from Last 3 Encounters:  03/09/18 259 lb 6 oz (117.7 kg)  01/16/17 237 lb 2 oz (107.6 kg)  12/30/16 230 lb (104.3 kg)   Past Medical History:  Diagnosis Date  . Arthritis   . Asthma   . Depression   . DJD (degenerative joint disease), lumbar   . Fructose intolerance   . GERD (gastroesophageal reflux disease)   . H/O hiatal hernia   . Hard of hearing    bilat   . Headache    migraines  . Heart murmur    echo done  80's neg  . History of bronchitis   . History of frequent urinary tract infections   . History of vertigo   . Hyperlipidemia   . Hypertension    currently taking no medications   . Obesity   . Pinched nerve    in back  . PONV (postoperative nausea and vomiting)    patch used last time  . Pre-diabetes   . Restless legs   . Seasonal allergies   . Sleep apnea    Intolerant to CPAP last sleep study  . Tinnitus   . Wears glasses      Social History   Socioeconomic History  . Marital status: Married    Spouse name: Not on file  . Number of children: 1  . Years of education: Not on file  . Highest education level: Not on file  Occupational History  . Occupation: Quality control ccordinator  Tobacco Use  . Smoking status: Never Smoker  . Smokeless tobacco: Never Used  Substance and Sexual Activity  . Alcohol use: No  . Drug use: No  . Sexual activity: Not on file  Other Topics Concern  . Not on file  Social History Narrative   Lives with husband who is transplant patient   Completed some college            Social Determinants of Radio broadcast assistant  Strain:   . Difficulty of Paying Living Expenses: Not on file  Food Insecurity:   . Worried About Charity fundraiser in the Last Year: Not on file  . Ran Out of Food in the Last Year: Not on file  Transportation Needs:   . Lack of Transportation (Medical): Not on file  . Lack of Transportation (Non-Medical): Not on file  Physical Activity:   . Days of Exercise per Week: Not on file  . Minutes of Exercise per Session: Not on file  Stress:   . Feeling of Stress : Not on file  Social Connections:   . Frequency of Communication with Friends and Family: Not on file  . Frequency of Social Gatherings with Friends and Family: Not on file  . Attends Religious Services: Not on file  . Active Member of Clubs or Organizations: Not on file  . Attends Archivist Meetings: Not on file  . Marital Status: Not  on file  Intimate Partner Violence:   . Fear of Current or Ex-Partner: Not on file  . Emotionally Abused: Not on file  . Physically Abused: Not on file  . Sexually Abused: Not on file    Past Surgical History:  Procedure Laterality Date  . ABDOMINAL HYSTERECTOMY  1995   No oophorectomy  . BREATH TEK H PYLORI N/A 06/23/2014   Procedure: BREATH TEK H PYLORI;  Surgeon: Greer Pickerel, MD;  Location: [facility];  Service: General;  Laterality: N/A;  . CHOLECYSTECTOMY N/A 07/12/2015   Procedure: LAPAROSCOPIC CHOLECYSTECTOMY WITH INTRAOPERATIVE CHOLANGIOGRAM;  Surgeon: Greer Pickerel, MD;  Location: [facility];  Service: General;  Laterality: N/A;  . DIAGNOSTIC LAPAROSCOPY    . DILATION AND CURETTAGE OF UTERUS    . KNEE SURGERY  04/2009   right knee, meniscus tear  . LAPAROSCOPIC GASTRIC SLEEVE RESECTION WITH HIATAL HERNIA REPAIR N/A 10/03/2014   Procedure: LAPAROSCOPIC GASTRIC SLEEVE RESECTION WITH HIATAL HERNIA REPAIR W UPPER ENDO;  Surgeon: Greer Pickerel, MD;  Location: [facility];  Service: General;  Laterality: N/A;  . ORIF ANKLE FRACTURE Right 10/17/2013   Procedure: OPEN REDUCTION INTERNAL FIXATION (ORIF) RIGHT ANKLE DISTAL FIBULA/LATERAL MALLEOLUS;  Surgeon: Vickey Huger, MD;  Location: [facility];  Service: Orthopedics;  Laterality: Right;  . OTHER SURGICAL HISTORY  1995?   removed scar tissue from colon  . thigh tuck Bilateral    inner thigh tuck  . UPPER GI ENDOSCOPY N/A 10/03/2014   Procedure: UPPER GI ENDOSCOPY;  Surgeon: Greer Pickerel, MD;  Location: [facility];  Service: General;  Laterality: N/A;  . WRIST SURGERY  1980s   cyst removed from right wrist    Family History  Problem Relation Age of Onset  . Diabetes Mother   . Hyperlipidemia Mother   . Hypertension Mother   . Heart attack Mother        64 y/o  . Diabetes Sister   . Hyperlipidemia Sister   . Hypertension Sister   . Diabetes Brother   . Hyperlipidemia Brother   . Hypertension Brother   . Heart attack Brother   . Heart disease  Father   . Leukemia Father   . Diabetes Maternal Grandmother   . Hyperlipidemia Maternal Grandmother   . Hypertension Maternal Grandmother   . Breast cancer Other        GM  . Colon cancer Neg Hx     Allergies  Allergen Reactions  . Codeine Anaphylaxis  . Erythromycin Hives  . Tetracyclines & Related Hives  .  Darvon [Propoxyphene] Photosensitivity    Blurry vision for 3 days  . Morphine And Related Dermatitis    "burning in veins"  . Other Other (See Comments)    Tomatoes upset stomach, itchy    . Penicillins Hives    Has patient had a PCN reaction causing immediate rash, facial/tongue/throat swelling, SOB or lightheadedness with hypotension: Yes Has patient had a PCN reaction causing severe rash involving mucus membranes or skin necrosis: No Has patient had a PCN reaction that required hospitalization No Has patient had a PCN reaction occurring within the last 10 years: No If all of the above answers are "NO", then may proceed with Cephalosporin use.   . Sulfa Antibiotics Nausea And Vomiting    Current Outpatient Medications on File Prior to Visit  Medication Sig Dispense Refill  . Albuterol Sulfate (PROAIR RESPICLICK) 123XX123 (90 Base) MCG/ACT AEPB Inhale 2 puffs into the lungs 4 (four) times daily as needed. 1 each 3  . Calcium Citrate-Vitamin D (CALCIUM CITRATE+D3 PETITES PO) Take 6 tablets by mouth daily.    . cetirizine (ZYRTEC) 10 MG tablet Take 10 mg by mouth daily.    Marland Kitchen gabapentin (NEURONTIN) 600 MG tablet Take 1 tablet (600 mg total) by mouth 2 (two) times daily. Needs ov 60 tablet 0  . Multiple Vitamins-Minerals (MULTIVITAMIN WITH MINERALS) tablet Take 2 tablets by mouth daily.     . Omega-3 Fatty Acids (FISH OIL PO) Take 1 capsule by mouth daily.     Marland Kitchen VITAMIN D, CHOLECALCIFEROL, PO Take 5,000 Units by mouth 2 (two) times daily.     Marland Kitchen azelastine (ASTELIN) 0.1 % nasal spray Place 2 sprays into both nostrils 2 (two) times daily. Use in each nostril as directed (Patient  not taking: Reported on 02/21/2019) 30 mL 6   No current facility-administered medications on file prior to visit.    There were no vitals taken for this visit.     Observations/Objective:   Gen: Awake, alert, no acute distress Resp: Breathing is even and non-labored Psych: calm/pleasant demeanor Neuro: Alert and Oriented x 3, + facial symmetry, speech is clear.   Assessment and Plan:  Headache/malaise- etiology is unclear.  I have advised that she be seen in the office for a face to face visit today and we will arrange. Sertraline withdrawal could certainly be a potential cause, but I think she would benefit from further in person evaluation with exam, vitals and baseline lab work. Pt is agreeable to plan and we will arrange.   30 minutes spent on today's visit.   Follow Up Instructions:    I discussed the assessment and treatment plan with the patient. The patient was provided an opportunity to ask questions and all were answered. The patient agreed with the plan and demonstrated an understanding of the instructions.   The patient was advised to call back or seek an in-person evaluation if the symptoms worsen or if the condition fails to improve as anticipated.  Nance Pear, NP

## 2019-02-21 NOTE — Patient Instructions (Addendum)
Omron Blood Pressure cuff, upper arm, want BP 100-140/60-90 Pulse oximeter, want oxygen in 90s  Weekly vitals  Take Multivitamin with minerals, selenium Vitamin D 1000-2000 IU daily Probiotic with lactobacillus and bifidophilus, [employer] company at [email or web address], [employer] Asprin EC 81 mg daily  Melatonin 2-5 mg at bedtime  [email or web address]  60-80 ounces of fluids daily

## 2019-02-21 NOTE — Assessment & Plan Note (Signed)
minimize simple carbs. Increase exercise as tolerated.  

## 2019-02-21 NOTE — Assessment & Plan Note (Signed)
She has been trying to titrate off of her sertraline but was only at 50 mg so noot on an excessively high dose so less likely to be contributing to her current symptoms

## 2019-02-22 LAB — COMPREHENSIVE METABOLIC PANEL
ALT: 18 U/L (ref 0–35)
AST: 20 U/L (ref 0–37)
Albumin: 4.3 g/dL (ref 3.5–5.2)
Alkaline Phosphatase: 76 U/L (ref 39–117)
BUN: 28 mg/dL — ABNORMAL HIGH (ref 6–23)
CO2: 29 mEq/L (ref 19–32)
Calcium: 9.7 mg/dL (ref 8.4–10.5)
Chloride: 103 mEq/L (ref 96–112)
Creatinine, Ser: 0.81 mg/dL (ref 0.40–1.20)
GFR: 71.51 mL/min (ref >60.00)
Glucose, Bld: 108 mg/dL — ABNORMAL HIGH (ref 70–99)
Potassium: 5.1 mEq/L (ref 3.5–5.1)
Sodium: 138 mEq/L (ref 135–145)
Total Bilirubin: 0.3 mg/dL (ref 0.2–1.2)
Total Protein: 7.5 g/dL (ref 6.0–8.3)

## 2019-02-22 LAB — CBC WITH DIFFERENTIAL/PLATELET
Basophils Absolute: 0.1 10*3/uL (ref 0.0–0.1)
Basophils Relative: 1.8 % (ref 0.0–3.0)
Eosinophils Absolute: 0.2 10*3/uL (ref 0.0–0.7)
Eosinophils Relative: 2.6 % (ref 0.0–5.0)
HCT: 44.8 % (ref 36.0–46.0)
Hemoglobin: 14.7 g/dL (ref 12.0–15.0)
Lymphocytes Relative: 42.8 % (ref 12.0–46.0)
Lymphs Abs: 3 10*3/uL (ref 0.7–4.0)
MCHC: 32.8 g/dL (ref 30.0–36.0)
MCV: 93.4 fl (ref 78.0–100.0)
Monocytes Absolute: 0.5 10*3/uL (ref 0.1–1.0)
Monocytes Relative: 6.9 % (ref 3.0–12.0)
Neutro Abs: 3.2 10*3/uL (ref 1.4–7.7)
Neutrophils Relative %: 45.9 % (ref 43.0–77.0)
Platelets: 234 10*3/uL (ref 150.0–400.0)
RBC: 4.8 Mil/uL (ref 3.87–5.11)
RDW: 13.7 % (ref 11.5–15.5)
WBC: 7 10*3/uL (ref 4.0–10.5)

## 2019-02-22 LAB — MAGNESIUM: Magnesium: 2.1 mg/dL (ref 1.5–2.5)

## 2019-02-22 LAB — TSH: TSH: 2.5 u[IU]/mL (ref 0.35–4.50)

## 2019-02-22 LAB — VITAMIN B12: Vitamin B-12: 1324 pg/mL — ABNORMAL HIGH (ref 211–911)

## 2019-02-22 LAB — HEMOGLOBIN A1C: Hgb A1c MFr Bld: 6 % (ref 4.6–6.5)

## 2019-02-24 ENCOUNTER — Ambulatory Visit: Payer: Managed Care, Other (non HMO) | Admitting: Family Medicine

## 2019-03-17 ENCOUNTER — Encounter: Payer: Self-pay | Admitting: Dietician

## 2019-03-17 ENCOUNTER — Encounter: Payer: Managed Care, Other (non HMO) | Attending: Family Medicine | Admitting: Dietician

## 2019-03-17 ENCOUNTER — Other Ambulatory Visit: Payer: Self-pay

## 2019-03-17 DIAGNOSIS — E669 Obesity, unspecified: Secondary | ICD-10-CM | POA: Diagnosis present

## 2019-03-17 NOTE — Progress Notes (Signed)
Medical Nutrition Therapy   Primary concerns today: s/p bariatric surgery, weight management, fructose intolerance, gluten intolerance, hypoglycemia, high vitamin B12 levels   Referral diagnoses: R73.9- hyperglycemia; I10- hypertension; E78.5- hyperlipidemia; Z98.84- s/p bariatric surgery  Preferred learning style: no preference indicated Learning readiness: contemplating   NUTRITION ASSESSMENT   Clinical Medical Hx: obesity, asthma, high cholesterol, sleep apnea, gastric sleeve (2016), fructose intolerance, GERD, prediabetes Notable Signs/Symptoms: constipation   Lifestyle & Dietary Hx Patient states she lives with her husband who had a double transplant and is now retired. States they eat out often and rarely eat at home. Patient reports having low blood sugar (checks using her husband's meter) quite frequently, even after eating carbs/sugar, and states she was diagnosed with hypoglycemia a few years ago. Has prediabetes. States she was told in years past to avoid gluten because it may contribute to her headaches. States she has been diagnosed with fructose intolerance and that she can't eat any fruits or vegetables.   States she follows a high protein diet d/t her hx of weight loss surgery. May have Hampton Dean's gravy sausage bowl for breakfast on the weekends, otherwise eats a Premier Protein shake daily for breakfast. May eat meat with bread such as a [place] chicken biscuit, an Atkins meal, or McDonald's hamburger.    Estimated daily fluid intake: 60+ oz Supplements: probiotic, calcium (6 petite/day), vitamin D3 (10,000 IU/day), Flintstone's MVI (1/day)   Current average weekly physical activity: ADLs   24-Hr Dietary Recall First Meal: Premier Protein shake  Snack: - Second Meal: [place] chicken biscuit  Snack: cashews  Third Meal: McDonald's bun and hamburger patty  Snack: glass 2% milk  Beverages: decaf coffee w/ creamer, water, 2% milk  Estimated Energy  Needs Calories: 1500-1600 Carbohydrate: 170-180g Protein: 94-100g Fat: 50-53g   NUTRITION DIAGNOSIS  Predicted inadequate nutrient intake (NI-5.11.1) related to hx of bariatric surgery and poor diet as evidenced by patient reported dietary intake of nutritionally poor foods, small quantities of food overall, absence of bariatric multivitamin, and avoidance of particular foods including those containing gluten and fructose.    NUTRITION INTERVENTION  Nutrition education (E-1) on the following topics:  . Bariatric Nutrition Therapy  . Consistent Carbohydrate Intake . Gluten-Free  . Fructose-Free  Handouts Provided Include   Phase 7: Lifelong Maintenance   Bariatric MyPlate  Low Fructose   Guide to Gluten-Free Foods   Learning Style & Readiness for Change Teaching method utilized: Visual & Auditory  Demonstrated degree of understanding via: Teach Back  Barriers to learning/adherence to lifestyle change: Contemplative Stage of Change  Goals Established by Pt . Eat protein with all meals/snacks . Eat carbohydrates (fructose and gluten free sources) with all meals/snacks  . Log foods/beverages at least 2-3 days per week and note symptoms (if any)   MONITORING & EVALUATION Dietary intake, weekly physical activity, and goals in 6 weeks.  Next Steps  Patient is to return to [facility] for follow up visit in 6 weeks.

## 2019-03-17 NOTE — Patient Instructions (Addendum)
Continue to focus on eating plenty of protein. Eat a source of protein at every meal and snack if possible.   Try to eat a source of carbohydrates (that do not not contain gluten or fructose) at every meal and snack as well. Use the handouts from today to choose gluten-free and fructose-free options.   Write down everything you eat and drink (try doing this ~2-3 days per week), and maybe try one of the following apps:   MyFitness Pal   Baritastic   In general, pay attention to the foods that you eat and try to eat similarly to the Bariatric MyPlate handout picture.

## 2019-03-21 ENCOUNTER — Other Ambulatory Visit: Payer: Self-pay

## 2019-03-21 ENCOUNTER — Ambulatory Visit (INDEPENDENT_AMBULATORY_CARE_PROVIDER_SITE_OTHER): Payer: Managed Care, Other (non HMO) | Admitting: Family Medicine

## 2019-03-21 DIAGNOSIS — E785 Hyperlipidemia, unspecified: Secondary | ICD-10-CM | POA: Diagnosis not present

## 2019-03-21 DIAGNOSIS — Z9884 Bariatric surgery status: Secondary | ICD-10-CM | POA: Diagnosis not present

## 2019-03-21 DIAGNOSIS — R739 Hyperglycemia, unspecified: Secondary | ICD-10-CM

## 2019-03-21 DIAGNOSIS — I10 Essential (primary) hypertension: Secondary | ICD-10-CM

## 2019-03-21 DIAGNOSIS — J329 Chronic sinusitis, unspecified: Secondary | ICD-10-CM

## 2019-03-21 MED ORDER — BUTALBITAL-ACETAMINOPHEN 50-300 MG PO TABS: 1.0000 | ORAL_TABLET | Freq: Three times a day (TID) | ORAL | 1 refills | Status: DC | PRN

## 2019-03-21 NOTE — Progress Notes (Addendum)
Patient ID: Taylor Silva, female   DOB: 1955-09-20, 64 y.o.   MRN: [id number]  Virtual Visit via phone Note  I connected with Taylor Silva on 03/21/19 at  3:00 PM EDT by a phone enabled telemedicine application and verified that I am speaking with the correct person using two identifiers.  Location: Patient: work Provider: office   I discussed the limitations of evaluation and management by telemedicine and the availability of in person appointments. The patient expressed understanding and agreed to proceed.    Subjective:    Patient ID: Taylor Silva, female    DOB: [birth year], 64 y.o.   MRN: [id number]  No chief complaint on file.   HPI Patient is in today for follow up on chronic medical concerns. She did not have any significant response to Ciprofloxacin. She still has some congestion. She is having headaches nearly daily. No photophobia or phonophobia. No recent febrile illness or hospitalizations. Denies CP/palp/SOB/HA/congestion/fevers/GI or GU c/o. Taking meds as prescribed  Past Medical History:  Diagnosis Date  . Arthritis   . Asthma   . Depression   . DJD (degenerative joint disease), lumbar   . Fructose intolerance   . GERD (gastroesophageal reflux disease)   . H/O hiatal hernia   . Hard of hearing    bilat   . Headache    migraines  . Heart murmur    echo done 80's neg  . History of bronchitis   . History of frequent urinary tract infections   . History of vertigo   . Hyperlipidemia   . Hypertension    currently taking no medications   . Obesity   . Pinched nerve    in back  . PONV (postoperative nausea and vomiting)    patch used last time  . Pre-diabetes   . Restless legs   . Seasonal allergies   . Sleep apnea    Intolerant to CPAP last sleep study  . Tinnitus   . Wears glasses     Past Surgical History:  Procedure Laterality Date  . ABDOMINAL HYSTERECTOMY  1995   No oophorectomy  . BREATH TEK H PYLORI N/A 06/23/2014   Procedure:  BREATH TEK H PYLORI;  Surgeon: Greer Pickerel, MD;  Location: [facility];  Service: General;  Laterality: N/A;  . CHOLECYSTECTOMY N/A 07/12/2015   Procedure: LAPAROSCOPIC CHOLECYSTECTOMY WITH INTRAOPERATIVE CHOLANGIOGRAM;  Surgeon: Greer Pickerel, MD;  Location: [facility];  Service: General;  Laterality: N/A;  . DIAGNOSTIC LAPAROSCOPY    . DILATION AND CURETTAGE OF UTERUS    . KNEE SURGERY  04/2009   right knee, meniscus tear  . LAPAROSCOPIC GASTRIC SLEEVE RESECTION WITH HIATAL HERNIA REPAIR N/A 10/03/2014   Procedure: LAPAROSCOPIC GASTRIC SLEEVE RESECTION WITH HIATAL HERNIA REPAIR W UPPER ENDO;  Surgeon: Greer Pickerel, MD;  Location: [facility];  Service: General;  Laterality: N/A;  . ORIF ANKLE FRACTURE Right 10/17/2013   Procedure: OPEN REDUCTION INTERNAL FIXATION (ORIF) RIGHT ANKLE DISTAL FIBULA/LATERAL MALLEOLUS;  Surgeon: Vickey Huger, MD;  Location: [place];  Service: Orthopedics;  Laterality: Right;  . OTHER SURGICAL HISTORY  1995?   removed scar tissue from colon  . thigh tuck Bilateral    inner thigh tuck  . UPPER GI ENDOSCOPY N/A 10/03/2014   Procedure: UPPER GI ENDOSCOPY;  Surgeon: Greer Pickerel, MD;  Location: [facility];  Service: General;  Laterality: N/A;  . WRIST SURGERY  1980s   cyst removed from right wrist    Family History  Problem Relation Age  of Onset  . Diabetes Mother   . Hyperlipidemia Mother   . Hypertension Mother   . Heart attack Mother        64 y/o  . Diabetes Sister   . Hyperlipidemia Sister   . Hypertension Sister   . Diabetes Brother   . Hyperlipidemia Brother   . Hypertension Brother   . Heart attack Brother   . Heart disease Father   . Leukemia Father   . Diabetes Maternal Grandmother   . Hyperlipidemia Maternal Grandmother   . Hypertension Maternal Grandmother   . Breast cancer Other        GM  . Colon cancer Neg Hx     Social History   Socioeconomic History  . Marital status: Married    Spouse name: Not on file  . Number of children: 1  . Years of  education: Not on file  . Highest education level: Not on file  Occupational History  . Occupation: Quality control ccordinator  Tobacco Use  . Smoking status: Never Smoker  . Smokeless tobacco: Never Used  Substance and Sexual Activity  . Alcohol use: No  . Drug use: No  . Sexual activity: Not on file  Other Topics Concern  . Not on file  Social History Narrative   Lives with husband who is transplant patient   Completed some college            Social Determinants of Health   Financial Resource Strain:   . Difficulty of Paying Living Expenses:   Food Insecurity:   . Worried About Charity fundraiser in the Last Year:   . Arboriculturist in the Last Year:   Transportation Needs:   . Film/video editor (Medical):   Marland Kitchen Lack of Transportation (Non-Medical):   Physical Activity:   . Days of Exercise per Week:   . Minutes of Exercise per Session:   Stress:   . Feeling of Stress :   Social Connections:   . Frequency of Communication with Friends and Family:   . Frequency of Social Gatherings with Friends and Family:   . Attends Religious Services:   . Active Member of Clubs or Organizations:   . Attends Archivist Meetings:   Marland Kitchen Marital Status:   Intimate Partner Violence:   . Fear of Current or Ex-Partner:   . Emotionally Abused:   Marland Kitchen Physically Abused:   . Sexually Abused:     Outpatient Medications Prior to Visit  Medication Sig Dispense Refill  . Albuterol Sulfate (PROAIR RESPICLICK) [id number] (90 Base) MCG/ACT AEPB Inhale 2 puffs into the lungs 4 (four) times daily as needed. 1 each 3  . azelastine (ASTELIN) 0.1 % nasal spray Place 2 sprays into both nostrils 2 (two) times daily. Use in each nostril as directed 30 mL 6  . Calcium Citrate-Vitamin D (CALCIUM CITRATE+D3 PETITES PO) Take 6 tablets by mouth daily.    . cetirizine (ZYRTEC) 10 MG tablet Take 10 mg by mouth daily.    Marland Kitchen gabapentin (NEURONTIN) 600 MG tablet Take 1 tablet (600 mg total) by mouth 2  (two) times daily. Needs ov 60 tablet 0  . Multiple Vitamins-Minerals (MULTIVITAMIN WITH MINERALS) tablet Take 2 tablets by mouth daily.     . Omega-3 Fatty Acids (FISH OIL PO) Take 1 capsule by mouth daily.     Marland Kitchen VITAMIN D, CHOLECALCIFEROL, PO Take 5,000 Units by mouth 2 (two) times daily.     . ciprofloxacin (CIPRO) 250  MG tablet Take 1 tablet (250 mg total) by mouth 2 (two) times daily. 14 tablet 0   No facility-administered medications prior to visit.    Allergies  Allergen Reactions  . Codeine Anaphylaxis  . Erythromycin Hives  . Tetracyclines & Related Hives  . Darvon [Propoxyphene] Photosensitivity    Blurry vision for 3 days  . Morphine And Related Dermatitis    "burning in veins"  . Other Other (See Comments)    Tomatoes upset stomach, itchy    . Penicillins Hives    Has patient had a PCN reaction causing immediate rash, facial/tongue/throat swelling, SOB or lightheadedness with hypotension: Yes Has patient had a PCN reaction causing severe rash involving mucus membranes or skin necrosis: No Has patient had a PCN reaction that required hospitalization No Has patient had a PCN reaction occurring within the last 10 years: No If all of the above answers are "NO", then may proceed with Cephalosporin use.   . Sulfa Antibiotics Nausea And Vomiting    Review of Systems  Constitutional: Positive for malaise/fatigue. Negative for fever.  HENT: Positive for congestion.   Eyes: Negative for blurred vision.  Respiratory: Negative for shortness of breath.   Cardiovascular: Negative for chest pain, palpitations and leg swelling.  Gastrointestinal: Negative for abdominal pain, blood in stool and nausea.  Genitourinary: Negative for dysuria and frequency.  Musculoskeletal: Negative for falls.  Skin: Negative for rash.  Neurological: Positive for headaches. Negative for dizziness and loss of consciousness.  Endo/Heme/Allergies: Negative for environmental allergies.    Psychiatric/Behavioral: Negative for depression. The patient is not nervous/anxious.        Objective:    Physical Exam unable to obtain via phone visit  There were no vitals taken for this visit. Wt Readings from Last 3 Encounters:  02/21/19 248 lb (112.5 kg)  03/09/18 259 lb 6 oz (117.7 kg)  01/16/17 237 lb 2 oz (107.6 kg)    Diabetic Foot Exam - Simple   No data filed     Lab Results  Component Value Date   WBC 7.0 02/21/2019   HGB 14.7 02/21/2019   HCT 44.8 02/21/2019   PLT 234.0 02/21/2019   GLUCOSE 108 (H) 02/21/2019   CHOL 230 (H) 03/09/2018   TRIG 86.0 03/09/2018   HDL 70.60 03/09/2018   LDLCALC 142 (H) 03/09/2018   ALT 18 02/21/2019   AST 20 02/21/2019   NA 138 02/21/2019   K 5.1 02/21/2019   CL 103 02/21/2019   CREATININE 0.81 02/21/2019   BUN 28 (H) 02/21/2019   CO2 29 02/21/2019   TSH 2.50 02/21/2019   HGBA1C 6.0 02/21/2019    Lab Results  Component Value Date   TSH 2.50 02/21/2019   Lab Results  Component Value Date   WBC 7.0 02/21/2019   HGB 14.7 02/21/2019   HCT 44.8 02/21/2019   MCV 93.4 02/21/2019   PLT 234.0 02/21/2019   Lab Results  Component Value Date   NA 138 02/21/2019   K 5.1 02/21/2019   CO2 29 02/21/2019   GLUCOSE 108 (H) 02/21/2019   BUN 28 (H) 02/21/2019   CREATININE 0.81 02/21/2019   BILITOT 0.3 02/21/2019   ALKPHOS 76 02/21/2019   AST 20 02/21/2019   ALT 18 02/21/2019   PROT 7.5 02/21/2019   ALBUMIN 4.3 02/21/2019   CALCIUM 9.7 02/21/2019   ANIONGAP 6 07/11/2015   GFR 71.51 02/21/2019   Lab Results  Component Value Date   CHOL 230 (H) 03/09/2018   Lab  Results  Component Value Date   HDL 70.60 03/09/2018   Lab Results  Component Value Date   LDLCALC 142 (H) 03/09/2018   Lab Results  Component Value Date   TRIG 86.0 03/09/2018   Lab Results  Component Value Date   CHOLHDL 3 03/09/2018   Lab Results  Component Value Date   HGBA1C 6.0 02/21/2019       Assessment & Plan:   Problem List Items  Addressed This Visit    Hyperglycemia - Primary    hgba1c acceptable, minimize simple carbs. Increase exercise as tolerated.       Relevant Orders   Hemoglobin A1c   Hypertension   Relevant Orders   TSH   Comprehensive metabolic panel   CBC with Differential/Platelet   Hyperlipidemia    Encouraged heart healthy diet, increase exercise, avoid trans fats, consider a krill oil cap daily      Sinusitis    Tolerated Ciprofloxacin but did not note any improvement in her symptoms.        Other Visit Diagnoses    S/P gastric bypass       Relevant Orders   Magnesium   Vitamin B12      I have discontinued Taylor Silva's ciprofloxacin. I am also having her start on Butalbital-Acetaminophen. Additionally, I am having her maintain her multivitamin with minerals, Calcium Citrate-Vitamin D (CALCIUM CITRATE+D3 PETITES PO), Omega-3 Fatty Acids (FISH OIL PO), cetirizine, (VITAMIN D, CHOLECALCIFEROL, PO), azelastine, Albuterol Sulfate, and gabapentin.  Meds ordered this encounter  Medications  . Butalbital-Acetaminophen 50-300 MG TABS    Sig: Take 1 tablet by mouth 3 (three) times daily as needed.    Dispense:  40 tablet    Refill:  1     I discussed the assessment and treatment plan with the patient. The patient was provided an opportunity to ask questions and all were answered. The patient agreed with the plan and demonstrated an understanding of the instructions.   The patient was advised to call back or seek an in-person evaluation if the symptoms worsen or if the condition fails to improve as anticipated.  I provided 25 minutes of non-face-to-face time during this encounter.   Penni Homans, MD

## 2019-03-21 NOTE — Assessment & Plan Note (Signed)
Tolerated Ciprofloxacin but did not note any improvement in her symptoms.

## 2019-03-21 NOTE — Assessment & Plan Note (Signed)
Encouraged increased hydration, 64 ounces of clear fluids daily. Minimize alcohol and caffeine. Eat small frequent meals with lean proteins and complex carbs. Avoid high and low blood sugars. Get adequate sleep, 7-8 hours a night. Needs exercise daily preferably in the morning. Is given a prescription for Bupap to try for her headaches. She has been referred to neurology for further management for her headaches.

## 2019-03-21 NOTE — Assessment & Plan Note (Signed)
Encouraged heart healthy diet, increase exercise, avoid trans fats, consider a krill oil cap daily 

## 2019-03-21 NOTE — Assessment & Plan Note (Signed)
hgba1c acceptable, minimize simple carbs. Increase exercise as tolerated.  

## 2019-03-21 NOTE — Progress Notes (Signed)
d 

## 2019-03-25 ENCOUNTER — Telehealth: Payer: Self-pay

## 2019-03-25 ENCOUNTER — Other Ambulatory Visit: Payer: Self-pay | Admitting: Family Medicine

## 2019-03-25 NOTE — Telephone Encounter (Signed)
Butalbital-acetaminophen 50-300mg  not covered by [employer]. Preferred alternative: butalbital/APAP 50-325 mg tablets, B) butalbital/APAP/caffeine 50-325-40 mg capsules, C) butalbital/APAP/caffeine 50-325-40 tablets

## 2019-03-27 ENCOUNTER — Other Ambulatory Visit: Payer: Self-pay | Admitting: Family Medicine

## 2019-03-27 MED ORDER — BUTALBITAL-ACETAMINOPHEN 50-325 MG PO TABS: 1.0000 | ORAL_TABLET | Freq: Three times a day (TID) | ORAL | 0 refills | Status: DC | PRN

## 2019-03-27 NOTE — Telephone Encounter (Signed)
I sent in the [facility]

## 2019-04-01 MED ORDER — BUTALBITAL-ACETAMINOPHEN 50-325 MG PO TABS: 1.0000 | ORAL_TABLET | Freq: Three times a day (TID) | ORAL | 1 refills | Status: DC | PRN

## 2019-04-14 ENCOUNTER — Telehealth: Payer: Self-pay | Admitting: *Deleted

## 2019-04-14 ENCOUNTER — Other Ambulatory Visit: Payer: Self-pay | Admitting: Internal Medicine

## 2019-04-14 NOTE — Telephone Encounter (Signed)
Taylor Silva- can you reach out to Pt and see if she is switching providers to Dr.Blyth? She hasn't seen Dr. Larose Kells since 04/2018.

## 2019-04-14 NOTE — Telephone Encounter (Signed)
Ok, thx

## 2019-04-14 NOTE — Telephone Encounter (Signed)
I called patient in regards to a refill request because she has not seen Dr. Larose Kells in a while to get her set up to be seen.  She stated that she was seeing Dr. Charlett Blake now.  It looks like she saw Dr. Charlett Blake for an acute reason and then saw her again.    Dr. Charlett Blake are you ok with transfer of care?  Dr. Larose Kells are you ok with transfer of care?

## 2019-04-15 NOTE — Telephone Encounter (Signed)
OK with me.

## 2019-04-19 NOTE — Telephone Encounter (Signed)
Pcp updated.

## 2019-04-25 ENCOUNTER — Ambulatory Visit: Payer: Managed Care, Other (non HMO) | Admitting: Dietician

## 2019-04-27 ENCOUNTER — Other Ambulatory Visit: Payer: Self-pay | Admitting: Internal Medicine

## 2019-05-30 ENCOUNTER — Other Ambulatory Visit: Payer: Managed Care, Other (non HMO)

## 2019-06-10 ENCOUNTER — Telehealth: Payer: Managed Care, Other (non HMO) | Admitting: Family Medicine

## 2019-08-24 ENCOUNTER — Other Ambulatory Visit: Payer: Self-pay

## 2019-08-24 ENCOUNTER — Encounter (HOSPITAL_BASED_OUTPATIENT_CLINIC_OR_DEPARTMENT_OTHER): Payer: Self-pay | Admitting: Emergency Medicine

## 2019-08-24 ENCOUNTER — Emergency Department (HOSPITAL_BASED_OUTPATIENT_CLINIC_OR_DEPARTMENT_OTHER)
Admission: EM | Admit: 2019-08-24 | Discharge: 2019-08-24 | Disposition: A | Discharge status: Home / Self Care | Payer: No Typology Code available for payment source | Attending: Emergency Medicine | Admitting: Emergency Medicine

## 2019-08-24 ENCOUNTER — Emergency Department (HOSPITAL_BASED_OUTPATIENT_CLINIC_OR_DEPARTMENT_OTHER): Payer: No Typology Code available for payment source

## 2019-08-24 DIAGNOSIS — S8991XA Unspecified injury of right lower leg, initial encounter: Secondary | ICD-10-CM | POA: Diagnosis present

## 2019-08-24 DIAGNOSIS — S8001XA Contusion of right knee, initial encounter: Secondary | ICD-10-CM | POA: Diagnosis not present

## 2019-08-24 DIAGNOSIS — Y999 Unspecified external cause status: Secondary | ICD-10-CM | POA: Diagnosis not present

## 2019-08-24 DIAGNOSIS — Y929 Unspecified place or not applicable: Secondary | ICD-10-CM | POA: Diagnosis not present

## 2019-08-24 DIAGNOSIS — Y9389 Activity, other specified: Secondary | ICD-10-CM | POA: Insufficient documentation

## 2019-08-24 DIAGNOSIS — S161XXA Strain of muscle, fascia and tendon at neck level, initial encounter: Secondary | ICD-10-CM | POA: Insufficient documentation

## 2019-08-24 MED ORDER — IBUPROFEN 400 MG PO TABS
400.0000 mg | ORAL_TABLET | Freq: Once | ORAL | Status: AC
  Administered 2019-08-24: 400 mg via ORAL
  Filled 2019-08-24: qty 1

## 2019-08-24 NOTE — ED Notes (Signed)
Pt ambulatory with steady gait to room, pt denies dizziness, denies HA

## 2019-08-24 NOTE — Discharge Instructions (Signed)
Take 400 mg of ibuprofen every 6 hours as needed for aches/pain.
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
## 2019-08-24 NOTE — ED Notes (Signed)
ED Provider at bedside. 

## 2019-08-24 NOTE — ED Notes (Signed)
Pt discharged to home. Discharge instructions have been discussed with patient and/or family members. Pt verbally acknowledges understanding d/c instructions, and endorses comprehension to checkout at registration before leaving.  °
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
## 2019-12-09 ENCOUNTER — Other Ambulatory Visit: Payer: Self-pay | Admitting: Family Medicine

## 2019-12-16 ENCOUNTER — Other Ambulatory Visit: Payer: Self-pay | Admitting: Family Medicine

## 2019-12-16 NOTE — Telephone Encounter (Signed)
Last written: 04/01/19 Last ov: 03/21/19 Next ov: none Contract:  none UDS: none

## 2019-12-21 ENCOUNTER — Encounter: Payer: Self-pay | Admitting: Family

## 2019-12-21 ENCOUNTER — Other Ambulatory Visit: Payer: Self-pay

## 2019-12-21 ENCOUNTER — Ambulatory Visit (INDEPENDENT_AMBULATORY_CARE_PROVIDER_SITE_OTHER): Payer: Managed Care, Other (non HMO) | Admitting: Family

## 2019-12-21 VITALS — BP 114/74 | HR 82 | Temp 98.7°F | Resp 16 | Ht 66.0 in | Wt 264.0 lb

## 2019-12-21 DIAGNOSIS — F419 Anxiety disorder, unspecified: Secondary | ICD-10-CM

## 2019-12-21 DIAGNOSIS — F32A Depression, unspecified: Secondary | ICD-10-CM | POA: Diagnosis not present

## 2019-12-21 DIAGNOSIS — R519 Headache, unspecified: Secondary | ICD-10-CM | POA: Diagnosis not present

## 2019-12-21 MED ORDER — PROPRANOLOL HCL 20 MG PO TABS: 20.0000 mg | ORAL_TABLET | Freq: Two times a day (BID) | ORAL | 1 refills | Status: DC

## 2019-12-21 MED ORDER — DULOXETINE HCL 30 MG PO CPEP
ORAL_CAPSULE | ORAL | 0 refills | Status: DC
Start: 2019-12-21 — End: 2020-01-17

## 2019-12-21 NOTE — Progress Notes (Signed)
Subjective:    Patient ID: Taylor Silva, female    DOB: [birth year], 64 y.o.   MRN: [id number]  HPI  Patient is a 64 yr old female who presents today with chief complaint of headache.She reports daily headaches.  She reports acetaminophen-butalbital which helped but she ran out.  HA is in the back of her head and around her eyes.  Her daily HA's are usually 5/10 and "last until I take something."  She does report occasional migraines.   Anxiety/Depression- denies SI.  Did not have any improvement with zoloft, she has tried celexa, wellbutrin,  Lexapro, prozac all without improvement.    Review of Systems    see HPI  Past Medical History:  Diagnosis Date  . Arthritis   . Asthma   . Depression   . DJD (degenerative joint disease), lumbar   . Fructose intolerance   . GERD (gastroesophageal reflux disease)   . H/O hiatal hernia   . Hard of hearing    bilat   . Headache    migraines  . Heart murmur    echo done 80's neg  . History of bronchitis   . History of frequent urinary tract infections   . History of vertigo   . Hyperlipidemia   . Hypertension    currently taking no medications   . Obesity   . Pinched nerve    in back  . PONV (postoperative nausea and vomiting)    patch used last time  . Pre-diabetes   . Restless legs   . Seasonal allergies   . Sleep apnea    Intolerant to CPAP last sleep study  . Tinnitus   . Wears glasses      Social History   Socioeconomic History  . Marital status: Married    Spouse name: Not on file  . Number of children: 1  . Years of education: Not on file  . Highest education level: Not on file  Occupational History  . Occupation: Quality control ccordinator  Tobacco Use  . Smoking status: Never Smoker  . Smokeless tobacco: Never Used  Substance and Sexual Activity  . Alcohol use: No  . Drug use: No  . Sexual activity: Not on file  Other Topics Concern  . Not on file  Social History Narrative   Lives with husband  who is transplant patient   Completed some college            Social Determinants of Health   Financial Resource Strain: Not on file  Food Insecurity: Not on file  Transportation Needs: Not on file  Physical Activity: Not on file  Stress: Not on file  Social Connections: Not on file  Intimate Partner Violence: Not on file    Past Surgical History:  Procedure Laterality Date  . ABDOMINAL HYSTERECTOMY  1995   No oophorectomy  . BREATH TEK H PYLORI N/A 06/23/2014   Procedure: BREATH TEK H PYLORI;  Surgeon: Greer Pickerel, MD;  Location: [facility];  Service: General;  Laterality: N/A;  . CHOLECYSTECTOMY N/A 07/12/2015   Procedure: LAPAROSCOPIC CHOLECYSTECTOMY WITH INTRAOPERATIVE CHOLANGIOGRAM;  Surgeon: Greer Pickerel, MD;  Location: [facility];  Service: General;  Laterality: N/A;  . DIAGNOSTIC LAPAROSCOPY    . DILATION AND CURETTAGE OF UTERUS    . KNEE SURGERY  04/2009   right knee, meniscus tear  . LAPAROSCOPIC GASTRIC SLEEVE RESECTION WITH HIATAL HERNIA REPAIR N/A 10/03/2014   Procedure: LAPAROSCOPIC GASTRIC SLEEVE RESECTION WITH HIATAL HERNIA REPAIR W UPPER  ENDO;  Surgeon: Greer Pickerel, MD;  Location: [facility];  Service: General;  Laterality: N/A;  . ORIF ANKLE FRACTURE Right 10/17/2013   Procedure: OPEN REDUCTION INTERNAL FIXATION (ORIF) RIGHT ANKLE DISTAL FIBULA/LATERAL MALLEOLUS;  Surgeon: Vickey Huger, MD;  Location: [place];  Service: Orthopedics;  Laterality: Right;  . OTHER SURGICAL HISTORY  1995?   removed scar tissue from colon  . thigh tuck Bilateral    inner thigh tuck  . UPPER GI ENDOSCOPY N/A 10/03/2014   Procedure: UPPER GI ENDOSCOPY;  Surgeon: Greer Pickerel, MD;  Location: [facility];  Service: General;  Laterality: N/A;  . WRIST SURGERY  1980s   cyst removed from right wrist    Family History  Problem Relation Age of Onset  . Diabetes Mother   . Hyperlipidemia Mother   . Hypertension Mother   . Heart attack Mother        29 y/o  . Diabetes Sister   . Hyperlipidemia Sister    . Hypertension Sister   . Diabetes Brother   . Hyperlipidemia Brother   . Hypertension Brother   . Heart attack Brother   . Heart disease Father   . Leukemia Father   . Diabetes Maternal Grandmother   . Hyperlipidemia Maternal Grandmother   . Hypertension Maternal Grandmother   . Breast cancer Other        GM  . Colon cancer Neg Hx     Allergies  Allergen Reactions  . Codeine Anaphylaxis  . Erythromycin Hives  . Tetracyclines & Related Hives  . Darvon [Propoxyphene] Photosensitivity    Blurry vision for 3 days  . Morphine And Related Dermatitis    "burning in veins"  . Other Other (See Comments)    Tomatoes upset stomach, itchy    . Penicillins Hives    Has patient had a PCN reaction causing immediate rash, facial/tongue/throat swelling, SOB or lightheadedness with hypotension: Yes Has patient had a PCN reaction causing severe rash involving mucus membranes or skin necrosis: No Has patient had a PCN reaction that required hospitalization No Has patient had a PCN reaction occurring within the last 10 years: No If all of the above answers are "NO", then may proceed with Cephalosporin use.   . Sulfa Antibiotics Nausea And Vomiting    Current Outpatient Medications on File Prior to Visit  Medication Sig Dispense Refill  . ACETAMINOPHEN-BUTALBITAL 50-325 MG TABS Take 1 tablet by mouth 3 (three) times daily as needed. 40 tablet 0  . ACETAMINOPHEN-BUTALBITAL 50-325 MG TABS Take 1 tablet by mouth three times daily as needed 40 tablet 0  . Albuterol Sulfate (PROAIR RESPICLICK) 269 (90 Base) MCG/ACT AEPB Inhale 2 puffs into the lungs 4 (four) times daily as needed. 1 each 3  . Azelastine HCl 137 MCG/SPRAY SOLN USE 2 SPRAY(S) IN EACH NOSTRIL TWICE DAILY AS DIRECTED 30 mL 0  . Calcium Citrate-Vitamin D (CALCIUM CITRATE+D3 PETITES PO) Take 6 tablets by mouth daily.    . cetirizine (ZYRTEC) 10 MG tablet Take 10 mg by mouth daily.    Marland Kitchen gabapentin (NEURONTIN) 600 MG tablet Take 1  tablet by mouth twice daily 180 tablet 0  . Multiple Vitamins-Minerals (MULTIVITAMIN WITH MINERALS) tablet Take 2 tablets by mouth daily.    . Omega-3 Fatty Acids (FISH OIL PO) Take 1 capsule by mouth daily.     Marland Kitchen VITAMIN D, CHOLECALCIFEROL, PO Take 5,000 Units by mouth 2 (two) times daily.      No current facility-administered medications on file prior  to visit.    BP 114/74 (BP Location: Right Arm, Patient Position: Sitting, Cuff Size: Large)   Pulse 82   Temp 98.7 F (37.1 C) (Oral)   Resp 16   Ht 5\' 6"  (1.676 m)   Wt 264 lb (119.7 kg)   SpO2 99%   BMI 42.61 kg/m    Objective:   Physical Exam Constitutional:      Appearance: She is well-developed and well-nourished.  Eyes:     Extraocular Movements: Extraocular movements intact.     Pupils: Pupils are equal, round, and reactive to light.  Neck:     Thyroid: No thyromegaly.  Cardiovascular:     Rate and Rhythm: Normal rate and regular rhythm.     Heart sounds: Normal heart sounds. No murmur heard.   Pulmonary:     Effort: Pulmonary effort is normal. No respiratory distress.     Breath sounds: Normal breath sounds. No wheezing.  Musculoskeletal:     Cervical back: Neck supple.  Skin:    General: Skin is warm and dry.  Neurological:     Mental Status: She is alert and oriented to person, place, and time.     Motor: No weakness.     Comments: Bilateral UE/LE strength bilaterally  Psychiatric:        Mood and Affect: Mood and affect normal.        Behavior: Behavior normal.        Thought Content: Thought content normal.        Judgment: Judgment normal.           Assessment & Plan:  Headaches- daily.  Will give trial of propranolol. We discussed referral to neurology but she declines due to cost.    Depression/anxiety- pt scored poorly today on her screenings:  PHQ9 SCORE ONLY 12/21/2019 03/17/2019 03/09/2018  PHQ-9 Total Score 26 0 22   GAD 7 : Generalized Anxiety Score 12/21/2019  Nervous, Anxious, on  Edge 3  Control/stop worrying 3  Worry too much - different things 3  Trouble relaxing 3  Restless 2  Easily annoyed or irritable 3  Afraid - awful might happen 3  Total GAD 7 Score 20  Anxiety Difficulty Very difficult    Will give trial of cymbalta 30mg  once daily for 3 days then increase to 60mg  once daily on week two. She declines referral for counseling as she has completed in the past and did not find it helpful.   Follow up in 1 month.

## 2019-12-21 NOTE — Patient Instructions (Signed)
Start cymbalta 1 cap once daily for 3 days, then increase to 2 caps once daily. Start propranolol 20mg  twice daily for headache/migraine prevention.

## 2020-01-17 ENCOUNTER — Ambulatory Visit (INDEPENDENT_AMBULATORY_CARE_PROVIDER_SITE_OTHER): Payer: Managed Care, Other (non HMO) | Admitting: Family

## 2020-01-17 ENCOUNTER — Other Ambulatory Visit: Payer: Self-pay

## 2020-01-17 DIAGNOSIS — F32A Depression, unspecified: Secondary | ICD-10-CM

## 2020-01-17 DIAGNOSIS — G43909 Migraine, unspecified, not intractable, without status migrainosus: Secondary | ICD-10-CM | POA: Diagnosis not present

## 2020-01-17 DIAGNOSIS — F419 Anxiety disorder, unspecified: Secondary | ICD-10-CM | POA: Diagnosis not present

## 2020-01-17 DIAGNOSIS — M549 Dorsalgia, unspecified: Secondary | ICD-10-CM

## 2020-01-17 DIAGNOSIS — G8929 Other chronic pain: Secondary | ICD-10-CM

## 2020-01-17 MED ORDER — GABAPENTIN 600 MG PO TABS: 600.0000 mg | ORAL_TABLET | Freq: Three times a day (TID) | ORAL | 1 refills | Status: DC

## 2020-01-17 MED ORDER — DULOXETINE HCL 30 MG PO CPEP: ORAL_CAPSULE | ORAL | 1 refills | Status: DC

## 2020-01-17 NOTE — Progress Notes (Signed)
Subjective:    Patient ID: Taylor Silva, female    DOB: 11/26/1955, 65 y.o.   MRN: [id number]  HPI  Patient is a 65 yr old female who presents today for follow up.  Depression/anxiety- reports that she feels possibly more sleepy during the day since beginning cymbalta.  She admits to not sleeping well and likely not getting enough hours of sleep each night. She feels that anxiety is improved.     Chronic back pain- continues gabapentin 600 mg tid which she finds helpful.  Migraines-She did not not start propranolol because she wanted to get used to cymbalta first.    She had the gastric sleeve surgery.  Needs a knee replacement.  She needs to lose weight before surgeon will perform knee replacement.  She would like to meet with the bariatric surgeon.    Max weight prior to surgery was 320. States that she got down to 230lb.    Wt Readings from Last 3 Encounters:  01/17/20 259 lb (117.5 kg)  12/21/19 264 lb (119.7 kg)  08/24/19 262 lb 9.6 oz (119.1 kg)      Review of Systems See HPI  Past Medical History:  Diagnosis Date  . Arthritis   . Asthma   . Depression   . DJD (degenerative joint disease), lumbar   . Fructose intolerance   . GERD (gastroesophageal reflux disease)   . H/O hiatal hernia   . Hard of hearing    bilat   . Headache    migraines  . Heart murmur    echo done 80's neg  . History of bronchitis   . History of frequent urinary tract infections   . History of vertigo   . Hyperlipidemia   . Hypertension    currently taking no medications   . Obesity   . Pinched nerve    in back  . PONV (postoperative nausea and vomiting)    patch used last time  . Pre-diabetes   . Restless legs   . Seasonal allergies   . Sleep apnea    Intolerant to CPAP last sleep study  . Tinnitus   . Wears glasses      Social History   Socioeconomic History  . Marital status: Married    Spouse name: Not on file  . Number of children: 1  . Years of education: Not  on file  . Highest education level: Not on file  Occupational History  . Occupation: Quality control ccordinator  Tobacco Use  . Smoking status: Never Smoker  . Smokeless tobacco: Never Used  Substance and Sexual Activity  . Alcohol use: No  . Drug use: No  . Sexual activity: Not on file  Other Topics Concern  . Not on file  Social History Narrative   Lives with husband who is transplant patient   Completed some college            Social Determinants of Health   Financial Resource Strain: Not on file  Food Insecurity: Not on file  Transportation Needs: Not on file  Physical Activity: Not on file  Stress: Not on file  Social Connections: Not on file  Intimate Partner Violence: Not on file    Past Surgical History:  Procedure Laterality Date  . ABDOMINAL HYSTERECTOMY  1995   No oophorectomy  . BREATH TEK H PYLORI N/A 06/23/2014   Procedure: BREATH TEK H PYLORI;  Surgeon: Greer Pickerel, MD;  Location: [facility];  Service: General;  Laterality: N/A;  .  CHOLECYSTECTOMY N/A 07/12/2015   Procedure: LAPAROSCOPIC CHOLECYSTECTOMY WITH INTRAOPERATIVE CHOLANGIOGRAM;  Surgeon: Greer Pickerel, MD;  Location: [facility];  Service: General;  Laterality: N/A;  . DIAGNOSTIC LAPAROSCOPY    . DILATION AND CURETTAGE OF UTERUS    . KNEE SURGERY  04/2009   right knee, meniscus tear  . LAPAROSCOPIC GASTRIC SLEEVE RESECTION WITH HIATAL HERNIA REPAIR N/A 10/03/2014   Procedure: LAPAROSCOPIC GASTRIC SLEEVE RESECTION WITH HIATAL HERNIA REPAIR W UPPER ENDO;  Surgeon: Greer Pickerel, MD;  Location: [facility];  Service: General;  Laterality: N/A;  . ORIF ANKLE FRACTURE Right 10/17/2013   Procedure: OPEN REDUCTION INTERNAL FIXATION (ORIF) RIGHT ANKLE DISTAL FIBULA/LATERAL MALLEOLUS;  Surgeon: Vickey Huger, MD;  Location: [facility];  Service: Orthopedics;  Laterality: Right;  . OTHER SURGICAL HISTORY  1995?   removed scar tissue from colon  . thigh tuck Bilateral    inner thigh tuck  . UPPER GI ENDOSCOPY N/A 10/03/2014    Procedure: UPPER GI ENDOSCOPY;  Surgeon: Greer Pickerel, MD;  Location: [facility];  Service: General;  Laterality: N/A;  . WRIST SURGERY  1980s   cyst removed from right wrist    Family History  Problem Relation Age of Onset  . Diabetes Mother   . Hyperlipidemia Mother   . Hypertension Mother   . Heart attack Mother        30 y/o  . Diabetes Sister   . Hyperlipidemia Sister   . Hypertension Sister   . Diabetes Brother   . Hyperlipidemia Brother   . Hypertension Brother   . Heart attack Brother   . Heart disease Father   . Leukemia Father   . Diabetes Maternal Grandmother   . Hyperlipidemia Maternal Grandmother   . Hypertension Maternal Grandmother   . Breast cancer Other        GM  . Colon cancer Neg Hx     Allergies  Allergen Reactions  . Codeine Anaphylaxis  . Erythromycin Hives  . Tetracyclines & Related Hives  . Darvon [Propoxyphene] Photosensitivity    Blurry vision for 3 days  . Morphine And Related Dermatitis    "burning in veins"  . Other Other (See Comments)    Tomatoes upset stomach, itchy    . Penicillins Hives    Has patient had a PCN reaction causing immediate rash, facial/tongue/throat swelling, SOB or lightheadedness with hypotension: Yes Has patient had a PCN reaction causing severe rash involving mucus membranes or skin necrosis: No Has patient had a PCN reaction that required hospitalization No Has patient had a PCN reaction occurring within the last 10 years: No If all of the above answers are "NO", then may proceed with Cephalosporin use.   . Sulfa Antibiotics Nausea And Vomiting    Current Outpatient Medications on File Prior to Visit  Medication Sig Dispense Refill  . ACETAMINOPHEN-BUTALBITAL 50-325 MG TABS Take 1 tablet by mouth 3 (three) times daily as needed. 40 tablet 0  . ACETAMINOPHEN-BUTALBITAL 50-325 MG TABS Take 1 tablet by mouth three times daily as needed 40 tablet 0  . Albuterol Sulfate (PROAIR RESPICLICK) 195 (90 Base) MCG/ACT  AEPB Inhale 2 puffs into the lungs 4 (four) times daily as needed. 1 each 3  . Azelastine HCl 137 MCG/SPRAY SOLN USE 2 SPRAY(S) IN EACH NOSTRIL TWICE DAILY AS DIRECTED 30 mL 0  . Calcium Citrate-Vitamin D (CALCIUM CITRATE+D3 PETITES PO) Take 6 tablets by mouth daily.    . cetirizine (ZYRTEC) 10 MG tablet Take 10 mg by mouth daily.    Marland Kitchen  DULoxetine (CYMBALTA) 30 MG capsule Take one capsule once daily for 3 days, then increase to 2 caps once daily 60 capsule 0  . gabapentin (NEURONTIN) 600 MG tablet Take 1 tablet by mouth twice daily 180 tablet 0  . Multiple Vitamins-Minerals (MULTIVITAMIN WITH MINERALS) tablet Take 2 tablets by mouth daily.    . Omega-3 Fatty Acids (FISH OIL PO) Take 1 capsule by mouth daily.     . propranolol (INDERAL) 20 MG tablet Take 1 tablet (20 mg total) by mouth 2 (two) times daily. 60 tablet 1  . VITAMIN D, CHOLECALCIFEROL, PO Take 5,000 Units by mouth 2 (two) times daily.      No current facility-administered medications on file prior to visit.    BP 120/68 (BP Location: Right Arm, Patient Position: Sitting, Cuff Size: Small)   Pulse 71   Temp 98.5 F (36.9 C) (Oral)   Resp 16   Ht 5\' 7"  (1.702 m)   Wt 259 lb (117.5 kg)   SpO2 100%   BMI 40.57 kg/m       Objective:   Physical Exam Constitutional:      Appearance: She is well-developed and well-nourished.  Cardiovascular:     Rate and Rhythm: Normal rate and regular rhythm.     Heart sounds: Normal heart sounds. No murmur heard.   Pulmonary:     Effort: Pulmonary effort is normal. No respiratory distress.     Breath sounds: Normal breath sounds. No wheezing.  Psychiatric:        Mood and Affect: Mood and affect normal.        Behavior: Behavior normal.        Thought Content: Thought content normal.        Judgment: Judgment normal.           Assessment & Plan:   Depression/Anxiety- stable/improved on cymbalta 60mg . She does not feel that the sleepiness is bad enough to discontinue the  cymbalta.    Depression screen [facility] 2/9 01/17/2020 12/21/2019 03/17/2019 03/09/2018 10/17/2014  Decreased Interest 1 3 0 3 0  Down, Depressed, Hopeless 0 3 0 3 0  PHQ - 2 Score 1 6 0 6 0  Altered sleeping 3 3 - 3 -  Tired, decreased energy 3 3 - 3 -  Change in appetite 1 3 - 3 -  Feeling bad or failure about yourself  0 3 - 3 -  Trouble concentrating 1 3 - 3 -  Moving slowly or fidgety/restless 1 3 - 1 -  Suicidal thoughts 0 2 - 0 -  PHQ-9 Score 10 26 - 22 -  Difficult doing work/chores Not difficult at all Extremely dIfficult - Somewhat difficult -    Migraines- she plans to begin propranolol for migraine prophylaxis. I advised her to schedule a follow up appointment 1 month after she begins propranolol.   Morbid Obesity- will refer back to her bariatric surgeon to discuss weight loss options.  Chronic back pain- stable on gabapentin. Continue same.

## 2020-02-08 ENCOUNTER — Other Ambulatory Visit: Payer: Self-pay

## 2020-02-08 ENCOUNTER — Ambulatory Visit (INDEPENDENT_AMBULATORY_CARE_PROVIDER_SITE_OTHER): Payer: Managed Care, Other (non HMO) | Admitting: Medical

## 2020-02-08 ENCOUNTER — Ambulatory Visit (HOSPITAL_BASED_OUTPATIENT_CLINIC_OR_DEPARTMENT_OTHER)
Admission: RE | Admit: 2020-02-08 | Discharge: 2020-02-08 | Disposition: A | Discharge status: Home / Self Care | Payer: Managed Care, Other (non HMO) | Source: Ambulatory Visit | Attending: Medical | Admitting: Medical

## 2020-02-08 VITALS — BP 129/69 | HR 84 | Temp 98.0°F | Resp 18 | Ht 67.0 in | Wt 262.0 lb

## 2020-02-08 DIAGNOSIS — Z8719 Personal history of other diseases of the digestive system: Secondary | ICD-10-CM

## 2020-02-08 DIAGNOSIS — R1031 Right lower quadrant pain: Secondary | ICD-10-CM | POA: Diagnosis present

## 2020-02-08 DIAGNOSIS — R300 Dysuria: Secondary | ICD-10-CM | POA: Diagnosis not present

## 2020-02-08 DIAGNOSIS — R809 Proteinuria, unspecified: Secondary | ICD-10-CM | POA: Diagnosis not present

## 2020-02-08 DIAGNOSIS — M549 Dorsalgia, unspecified: Secondary | ICD-10-CM

## 2020-02-08 LAB — POC URINALSYSI DIPSTICK (AUTOMATED)
Bilirubin, UA: NEGATIVE
Blood, UA: NEGATIVE
Glucose, UA: NEGATIVE
Ketones, UA: NEGATIVE
Leukocytes, UA: NEGATIVE
Nitrite, UA: NEGATIVE
Protein, UA: POSITIVE — AB
Spec Grav, UA: 1.02 (ref 1.010–1.025)
Urobilinogen, UA: 0.2 E.U./dL
pH, UA: 6 (ref 5.0–8.0)

## 2020-02-08 LAB — COMPREHENSIVE METABOLIC PANEL
ALT: 17 U/L (ref 0–35)
AST: 21 U/L (ref 0–37)
Albumin: 4.1 g/dL (ref 3.5–5.2)
Alkaline Phosphatase: 74 U/L (ref 39–117)
BUN: 25 mg/dL — ABNORMAL HIGH (ref 6–23)
CO2: 32 mEq/L (ref 19–32)
Calcium: 9.7 mg/dL (ref 8.4–10.5)
Chloride: 101 mEq/L (ref 96–112)
Creatinine, Ser: 0.75 mg/dL (ref 0.40–1.20)
GFR: 84.66 mL/min (ref >60.00)
Glucose, Bld: 106 mg/dL — ABNORMAL HIGH (ref 70–99)
Potassium: 4.3 mEq/L (ref 3.5–5.1)
Sodium: 137 mEq/L (ref 135–145)
Total Bilirubin: 0.4 mg/dL (ref 0.2–1.2)
Total Protein: 7 g/dL (ref 6.0–8.3)

## 2020-02-08 LAB — CBC WITH DIFFERENTIAL/PLATELET
Basophils Absolute: 0 10*3/uL (ref 0.0–0.1)
Basophils Relative: 0.4 % (ref 0.0–3.0)
Eosinophils Absolute: 0.1 10*3/uL (ref 0.0–0.7)
Eosinophils Relative: 1.6 % (ref 0.0–5.0)
HCT: 41.1 % (ref 36.0–46.0)
Hemoglobin: 13.7 g/dL (ref 12.0–15.0)
Lymphocytes Relative: 46.5 % — ABNORMAL HIGH (ref 12.0–46.0)
Lymphs Abs: 3.1 10*3/uL (ref 0.7–4.0)
MCHC: 33.3 g/dL (ref 30.0–36.0)
MCV: 92.5 fl (ref 78.0–100.0)
Monocytes Absolute: 0.4 10*3/uL (ref 0.1–1.0)
Monocytes Relative: 6.1 % (ref 3.0–12.0)
Neutro Abs: 3 10*3/uL (ref 1.4–7.7)
Neutrophils Relative %: 45.4 % (ref 43.0–77.0)
Platelets: 204 10*3/uL (ref 150.0–400.0)
RBC: 4.44 Mil/uL (ref 3.87–5.11)
RDW: 13.1 % (ref 11.5–15.5)
WBC: 6.7 10*3/uL (ref 4.0–10.5)

## 2020-02-08 NOTE — Patient Instructions (Signed)
Recent right lower quadrant pain appears more dull and achy.  History of IBS-C with last BM small and hard.  I think differential diagnosis would be constipation pain with possibility of appendix source of pain.  Urine does look clear except for mild protein.  Some kidney region pain as well.  We will get CBC and CMP stat.  Will include urine culture and study.  Recommend getting repeat UA on your wellness exam.  Potentially sooner if urine  infection were to be found.  We will also get 1 view abdomen x-ray to assess stool burden.  If significant stool present will advise on meds to clear and see if your symptoms improve.  However we will need to follow you closely as you do have some right lower quadrant region/appendix area pain.  If the pain worsens notify me and I will go ahead and order CT abdomen pelvis with contrast.  Also would order CT abdomen pelvis if pain persist despite potential constipation treatment.  Also you are due for screening colon cancer.  You would need a colonoscopy versus Cologuard.  Better to wait and make sure abdomen pain resolves before doing Cologuard as colonoscopy might be indicated.  Follow-up in approximately 5 to 7 days or as needed.

## 2020-02-08 NOTE — Progress Notes (Signed)
Subjective:    Patient ID: Taylor Silva, female    DOB: 1955/06/27, 65 y.o.   MRN: [id number]  HPI  Pt has some pain on her rt side abdomen and some on her lower back for about 1 week.  Pt does not have pain made worse by walking.  Pain in abdomen rlq constant and achy. No nausea and no vomiting.   Hx of ibs-c. Pt has appendix. She does not have gallbladder.  2008 normal coloonscopy. No diverticulosis or polyps.  No current nausea, vomiting, fever, chills or diarrhea.       Review of Systems  Constitutional: Negative for chills and fatigue.  Eyes: Negative for pain and redness.  Respiratory: Negative for cough, choking, shortness of breath and wheezing.   Cardiovascular: Negative for chest pain and palpitations.  Gastrointestinal: Positive for abdominal pain and constipation. Negative for abdominal distention, diarrhea, nausea and vomiting.       Last bowel movement yesterday and very small.  Has bm every 3 days. Hx of ibs constipation. Uses miralax every few days.  Genitourinary: Negative for dysuria, frequency and urgency.       She states urinating less. Urinate 3 times past 24 hours. Usually more.  Musculoskeletal: Negative for back pain, joint swelling, myalgias and neck stiffness.  Skin: Negative for rash.  Neurological: Negative for dizziness, seizures, speech difficulty, weakness, numbness and headaches.  Hematological: Negative for adenopathy. Does not bruise/bleed easily.  Psychiatric/Behavioral: Negative for behavioral problems, confusion, sleep disturbance and suicidal ideas. The patient is not nervous/anxious.     Past Medical History:  Diagnosis Date  . Arthritis   . Asthma   . Depression   . DJD (degenerative joint disease), lumbar   . Fructose intolerance   . GERD (gastroesophageal reflux disease)   . H/O hiatal hernia   . Hard of hearing    bilat   . Headache    migraines  . Heart murmur    echo done 80's neg  . History of bronchitis   .  History of frequent urinary tract infections   . History of vertigo   . Hyperlipidemia   . Hypertension    currently taking no medications   . Obesity   . Pinched nerve    in back  . PONV (postoperative nausea and vomiting)    patch used last time  . Pre-diabetes   . Restless legs   . Seasonal allergies   . Sleep apnea    Intolerant to CPAP last sleep study  . Tinnitus   . Wears glasses      Social History   Socioeconomic History  . Marital status: Married    Spouse name: Not on file  . Number of children: 1  . Years of education: Not on file  . Highest education level: Not on file  Occupational History  . Occupation: Quality control ccordinator  Tobacco Use  . Smoking status: Never Smoker  . Smokeless tobacco: Never Used  Substance and Sexual Activity  . Alcohol use: No  . Drug use: No  . Sexual activity: Not on file  Other Topics Concern  . Not on file  Social History Narrative   Lives with husband who is transplant patient   Completed some college            Social Determinants of Health   Financial Resource Strain: Not on file  Food Insecurity: Not on file  Transportation Needs: Not on file  Physical Activity: Not on file  Stress: Not on file  Social Connections: Not on file  Intimate Partner Violence: Not on file    Past Surgical History:  Procedure Laterality Date  . ABDOMINAL HYSTERECTOMY  1995   No oophorectomy  . BREATH TEK H PYLORI N/A 06/23/2014   Procedure: BREATH TEK H PYLORI;  Surgeon: Greer Pickerel, MD;  Location: [facility];  Service: General;  Laterality: N/A;  . CHOLECYSTECTOMY N/A 07/12/2015   Procedure: LAPAROSCOPIC CHOLECYSTECTOMY WITH INTRAOPERATIVE CHOLANGIOGRAM;  Surgeon: Greer Pickerel, MD;  Location: [facility];  Service: General;  Laterality: N/A;  . DIAGNOSTIC LAPAROSCOPY    . DILATION AND CURETTAGE OF UTERUS    . KNEE SURGERY  04/2009   right knee, meniscus tear  . LAPAROSCOPIC GASTRIC SLEEVE RESECTION WITH HIATAL HERNIA REPAIR N/A  10/03/2014   Procedure: LAPAROSCOPIC GASTRIC SLEEVE RESECTION WITH HIATAL HERNIA REPAIR W UPPER ENDO;  Surgeon: Greer Pickerel, MD;  Location: [facility];  Service: General;  Laterality: N/A;  . ORIF ANKLE FRACTURE Right 10/17/2013   Procedure: OPEN REDUCTION INTERNAL FIXATION (ORIF) RIGHT ANKLE DISTAL FIBULA/LATERAL MALLEOLUS;  Surgeon: Vickey Huger, MD;  Location: [place];  Service: Orthopedics;  Laterality: Right;  . OTHER SURGICAL HISTORY  1995?   removed scar tissue from colon  . thigh tuck Bilateral    inner thigh tuck  . UPPER GI ENDOSCOPY N/A 10/03/2014   Procedure: UPPER GI ENDOSCOPY;  Surgeon: Greer Pickerel, MD;  Location: [facility];  Service: General;  Laterality: N/A;  . WRIST SURGERY  1980s   cyst removed from right wrist    Family History  Problem Relation Age of Onset  . Diabetes Mother   . Hyperlipidemia Mother   . Hypertension Mother   . Heart attack Mother        21 y/o  . Diabetes Sister   . Hyperlipidemia Sister   . Hypertension Sister   . Diabetes Brother   . Hyperlipidemia Brother   . Hypertension Brother   . Heart attack Brother   . Heart disease Father   . Leukemia Father   . Diabetes Maternal Grandmother   . Hyperlipidemia Maternal Grandmother   . Hypertension Maternal Grandmother   . Breast cancer Other        GM  . Colon cancer Neg Hx     Allergies  Allergen Reactions  . Codeine Anaphylaxis  . Erythromycin Hives  . Tetracyclines & Related Hives  . Darvon [Propoxyphene] Photosensitivity    Blurry vision for 3 days  . Morphine And Related Dermatitis    "burning in veins"  . Other Other (See Comments)    Tomatoes upset stomach, itchy    . Penicillins Hives    Has patient had a PCN reaction causing immediate rash, facial/tongue/throat swelling, SOB or lightheadedness with hypotension: Yes Has patient had a PCN reaction causing severe rash involving mucus membranes or skin necrosis: No Has patient had a PCN reaction that required hospitalization No Has  patient had a PCN reaction occurring within the last 10 years: No If all of the above answers are "NO", then may proceed with Cephalosporin use.   . Sulfa Antibiotics Nausea And Vomiting    Current Outpatient Medications on File Prior to Visit  Medication Sig Dispense Refill  . ACETAMINOPHEN-BUTALBITAL 50-325 MG TABS Take 1 tablet by mouth 3 (three) times daily as needed. 40 tablet 0  . Albuterol Sulfate (PROAIR RESPICLICK) 123XX123 (90 Base) MCG/ACT AEPB Inhale 2 puffs into the lungs 4 (four) times daily as needed. 1 each 3  .  Azelastine HCl 137 MCG/SPRAY SOLN USE 2 SPRAY(S) IN EACH NOSTRIL TWICE DAILY AS DIRECTED 30 mL 0  . Calcium Citrate-Vitamin D (CALCIUM CITRATE+D3 PETITES PO) Take 6 tablets by mouth daily.    . cetirizine (ZYRTEC) 10 MG tablet Take 10 mg by mouth daily.    . DULoxetine (CYMBALTA) 30 MG capsule Take one capsule once daily for 3 days, then increase to 2 caps once daily 180 capsule 1  . gabapentin (NEURONTIN) 600 MG tablet Take 1 tablet (600 mg total) by mouth 3 (three) times daily. 180 tablet 1  . Multiple Vitamins-Minerals (MULTIVITAMIN WITH MINERALS) tablet Take 2 tablets by mouth daily.    . Omega-3 Fatty Acids (FISH OIL PO) Take 1 capsule by mouth daily.     . propranolol (INDERAL) 20 MG tablet Take 1 tablet (20 mg total) by mouth 2 (two) times daily. 60 tablet 1  . VITAMIN D, CHOLECALCIFEROL, PO Take 5,000 Units by mouth 2 (two) times daily.     . ACETAMINOPHEN-BUTALBITAL 50-325 MG TABS Take 1 tablet by mouth three times daily as needed (Patient not taking: Reported on 02/08/2020) 40 tablet 0   No current facility-administered medications on file prior to visit.    BP 129/69   Pulse 84   Temp 98 F (36.7 C) (Oral)   Resp 18   Ht 5\' 7"  (1.702 m)   Wt 262 lb (118.8 kg)   SpO2 98%   BMI 41.04 kg/m       Objective:   Physical Exam  General Appearance- Not in acute distress.  HEENT Eyes- Scleraeral/Conjuntiva-bilat- Not Yellow. Mouth & Throat-  Normal.  Chest and Lung Exam Auscultation: Breath sounds:-Normal. Adventitious sounds:- No Adventitious sounds.  Cardiovascular Auscultation:Rythm - Regular. Heart Sounds -Normal heart sounds.  Abdomen Inspection:-Inspection Normal.  Palpation/Perucssion: Palpation and Percussion of the abdomen reveal- rt lower quadrant  Tender 4/10 on palpitation but  No Rebound tenderness. Heel jar 2/10.  No rigidity(Guarding) and No Palpable abdominal masses.  Liver:-Normal.  Spleen:- Normal.   Back- mild rt cva area tender.  Skin- no rash on back or abdomen.  Walking down hall today in office no rlq pain      Assessment & Plan:  Recent right lower quadrant pain appears more dull and achy.  History of IBS-C with last BM small and hard.  I think differential diagnosis would be constipation pain with possibility of appendix source of pain.  Urine does look clear except for mild protein.  Some kidney region pain as well.  We will get CBC and CMP stat.  Will include urine culture and study.  Recommend getting repeat UA on your wellness exam.  Potentially sooner if urine  infection were to be found.  We will also get 1 view abdomen x-ray to assess stool burden.  If significant stool present will advise on meds to clear and see if your symptoms improve.  However we will need to follow you closely as you do have some right lower quadrant region/appendix area pain.  If the pain worsens notify me and I will go ahead and order CT abdomen pelvis with contrast.  Also would order CT abdomen pelvis if pain persist despite potential constipation treatment.  Also you are due for screening colon cancer.  You would need a colonoscopy versus Cologuard.  Better to wait and make sure abdomen pain resolves before doing Cologuard as colonoscopy might be indicated.  Follow-up in approximately 5 to 7 days or as needed.  Mackie Pai, PA-C

## 2020-02-08 NOTE — Addendum Note (Signed)
Addended by: Anabel Halon on: 02/08/2020 09:46 AM   Modules accepted: Orders

## 2020-02-08 NOTE — Addendum Note (Signed)
Addended by: Jeronimo Greaves on: 02/08/2020 10:18 AM   Modules accepted: Orders

## 2020-02-09 ENCOUNTER — Other Ambulatory Visit: Payer: Self-pay | Admitting: Family Medicine

## 2020-02-09 ENCOUNTER — Telehealth: Payer: Self-pay | Admitting: Family Medicine

## 2020-02-09 LAB — URINE CULTURE
MICRO NUMBER:: 11486615
SPECIMEN QUALITY:: ADEQUATE

## 2020-02-09 MED ORDER — LINACLOTIDE 290 MCG PO CAPS: 290.0000 ug | ORAL_CAPSULE | Freq: Every day | ORAL | 3 refills | Status: DC

## 2020-02-09 NOTE — Telephone Encounter (Signed)
I sent in the [place] [address] to take once a day. Hydrate  well.will need to see gastroenterology if does not work

## 2020-02-09 NOTE — Telephone Encounter (Signed)
Patient states she would like a prescription for  Linzess irritable bowel syndrome with constipation and chronic constipation  Please advice

## 2020-02-09 NOTE — Telephone Encounter (Signed)
Is this ok to send for pt?

## 2020-02-10 ENCOUNTER — Other Ambulatory Visit: Payer: Self-pay | Admitting: Family Medicine

## 2020-02-10 MED ORDER — LINACLOTIDE 290 MCG PO CAPS: 290.0000 ug | ORAL_CAPSULE | Freq: Every day | ORAL | 3 refills | Status: DC

## 2020-02-10 NOTE — Telephone Encounter (Signed)
There is not generic available for Linzess.  Not sure why she was told that.  I did try a coupon and it was $300.  To get her on to discuss.  Can you take a look of a good day to get her for like a 320pm?

## 2020-02-10 NOTE — Telephone Encounter (Signed)
Requesting a generic

## 2020-02-10 NOTE — Telephone Encounter (Signed)
Requesting generic

## 2020-02-10 NOTE — Telephone Encounter (Signed)
I sent over the generic option if that is still too expense then she will need an appt so we can discuss what she has tried and what other options are available that are not cost prohibitive

## 2020-02-10 NOTE — Telephone Encounter (Signed)
Spoke with patient and she stated that rx was $490, with her insurance.  I sent a savings card over to pharmacy, but if it is still expensive, what other drug can she possibly take?

## 2020-02-10 NOTE — Telephone Encounter (Signed)
Just pick one in the next month. I will not be in much next week so it will have to be after that.

## 2020-02-13 NOTE — Telephone Encounter (Signed)
Patient does not want another office visit.  So I asked the patient was she having any symptoms right now or is she in any pain.  She stated that she just want something to keep her clean. So no symptoms at all.  She does have Miralax on hand but does not take it every day.  She is willing to try something over the counter.

## 2020-02-13 NOTE — Telephone Encounter (Signed)
Patient notified

## 2020-02-13 NOTE — Telephone Encounter (Signed)
If her bowels are moving relatively well most days then I recommend she start with a 1/2 dose of Miralax mixed with a full dose of Benefiber every day and can go up and down from there to move comfortably every one to two days. Also get a high fiber diet, plenty of water 60-80 ounces and regular exercise also helps

## 2020-07-23 ENCOUNTER — Other Ambulatory Visit: Payer: Self-pay

## 2020-07-23 ENCOUNTER — Telehealth (INDEPENDENT_AMBULATORY_CARE_PROVIDER_SITE_OTHER): Payer: Managed Care, Other (non HMO) | Admitting: Family Medicine

## 2020-07-23 DIAGNOSIS — F32A Depression, unspecified: Secondary | ICD-10-CM | POA: Diagnosis not present

## 2020-07-23 DIAGNOSIS — R739 Hyperglycemia, unspecified: Secondary | ICD-10-CM

## 2020-07-23 DIAGNOSIS — E785 Hyperlipidemia, unspecified: Secondary | ICD-10-CM

## 2020-07-23 DIAGNOSIS — F4310 Post-traumatic stress disorder, unspecified: Secondary | ICD-10-CM | POA: Diagnosis not present

## 2020-07-23 DIAGNOSIS — R519 Headache, unspecified: Secondary | ICD-10-CM

## 2020-07-23 DIAGNOSIS — I10 Essential (primary) hypertension: Secondary | ICD-10-CM

## 2020-07-23 MED ORDER — MONTELUKAST SODIUM 10 MG PO TABS
10.0000 mg | ORAL_TABLET | Freq: Every evening | ORAL | 1 refills | Status: AC | PRN
Start: 2020-07-23 — End: ?

## 2020-07-23 MED ORDER — BUTALBITAL-ACETAMINOPHEN 50-325 MG PO TABS: 1.0000 | ORAL_TABLET | Freq: Three times a day (TID) | ORAL | 0 refills | Status: DC | PRN

## 2020-07-23 MED ORDER — DULOXETINE HCL 60 MG PO CPEP: 60.0000 mg | ORAL_CAPSULE | Freq: Every day | ORAL | 1 refills | Status: DC

## 2020-07-23 MED ORDER — DULOXETINE HCL 30 MG PO CPEP: 30.0000 mg | ORAL_CAPSULE | Freq: Every day | ORAL | 1 refills | Status: DC
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
## 2020-07-25 NOTE — Assessment & Plan Note (Signed)
Encourage heart healthy diet such as MIND or DASH diet, increase exercise, avoid trans fats, simple carbohydrates and processed foods, consider a krill or fish or flaxseed oil cap daily.  °

## 2020-07-25 NOTE — Assessment & Plan Note (Signed)
Encouraged increased hydration, 64 ounces of clear fluids daily. Minimize alcohol and caffeine. Eat small frequent meals with lean proteins and complex carbs. Avoid high and low blood sugars. Get adequate sleep, 7-8 hours a night. Needs exercise daily preferably in the morning.  

## 2020-07-25 NOTE — Assessment & Plan Note (Signed)
Well controlled, no changes to meds. Encouraged heart healthy diet such as the DASH diet and exercise as tolerated.  °
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
## 2020-07-25 NOTE — Assessment & Plan Note (Signed)
hgba1c acceptable, minimize simple carbs. Increase exercise as tolerated.  

## 2020-09-30 ENCOUNTER — Other Ambulatory Visit: Payer: Self-pay | Admitting: Family Medicine

## 2020-10-01 NOTE — Telephone Encounter (Signed)
Requesting: acetaminophen-butalbital 50-325mg  Contract: 09/14/2012 UDS: 06/24/2013 Last Visit: 07/23/2020 Next Visit: 10/25/2020 Last Refill: 07/23/2020 #40 and 0RF  Please Advise

## 2020-10-16 ENCOUNTER — Other Ambulatory Visit: Payer: Self-pay | Admitting: Family

## 2020-10-25 ENCOUNTER — Other Ambulatory Visit: Payer: Self-pay

## 2020-10-25 ENCOUNTER — Encounter: Payer: Self-pay | Admitting: Family Medicine

## 2020-10-25 ENCOUNTER — Ambulatory Visit: Payer: Managed Care, Other (non HMO) | Admitting: Family Medicine

## 2020-10-25 ENCOUNTER — Ambulatory Visit (HOSPITAL_BASED_OUTPATIENT_CLINIC_OR_DEPARTMENT_OTHER)
Admission: RE | Admit: 2020-10-25 | Discharge: 2020-10-25 | Disposition: A | Discharge status: Home / Self Care | Payer: Managed Care, Other (non HMO) | Source: Ambulatory Visit | Attending: Family Medicine | Admitting: Family Medicine

## 2020-10-25 VITALS — BP 112/68 | HR 77 | Temp 98.0°F | Resp 16 | Wt 251.8 lb

## 2020-10-25 DIAGNOSIS — Z1239 Encounter for other screening for malignant neoplasm of breast: Secondary | ICD-10-CM

## 2020-10-25 DIAGNOSIS — Z79899 Other long term (current) drug therapy: Secondary | ICD-10-CM

## 2020-10-25 DIAGNOSIS — F32A Depression, unspecified: Secondary | ICD-10-CM

## 2020-10-25 DIAGNOSIS — M542 Cervicalgia: Secondary | ICD-10-CM | POA: Diagnosis not present

## 2020-10-25 DIAGNOSIS — Z113 Encounter for screening for infections with a predominantly sexual mode of transmission: Secondary | ICD-10-CM

## 2020-10-25 DIAGNOSIS — E785 Hyperlipidemia, unspecified: Secondary | ICD-10-CM | POA: Diagnosis not present

## 2020-10-25 DIAGNOSIS — I10 Essential (primary) hypertension: Secondary | ICD-10-CM

## 2020-10-25 DIAGNOSIS — Z1159 Encounter for screening for other viral diseases: Secondary | ICD-10-CM

## 2020-10-25 DIAGNOSIS — Z1211 Encounter for screening for malignant neoplasm of colon: Secondary | ICD-10-CM

## 2020-10-25 DIAGNOSIS — R739 Hyperglycemia, unspecified: Secondary | ICD-10-CM

## 2020-10-25 MED ORDER — GABAPENTIN 600 MG PO TABS: 600.0000 mg | ORAL_TABLET | Freq: Three times a day (TID) | ORAL | 1 refills | Status: DC

## 2020-10-25 MED ORDER — DULOXETINE HCL 60 MG PO CPEP
60.0000 mg | ORAL_CAPSULE | Freq: Every day | ORAL | 1 refills | Status: DC
Start: 2020-10-25 — End: 2022-03-20

## 2020-10-25 MED ORDER — TIZANIDINE HCL 2 MG PO TABS: 1.0000 mg | ORAL_TABLET | Freq: Every evening | ORAL | 2 refills | Status: DC | PRN

## 2020-10-25 MED ORDER — DULOXETINE HCL 30 MG PO CPEP: 30.0000 mg | ORAL_CAPSULE | Freq: Every day | ORAL | 1 refills | Status: DC

## 2020-10-25 MED ORDER — PROPRANOLOL HCL 20 MG PO TABS: 20.0000 mg | ORAL_TABLET | Freq: Two times a day (BID) | ORAL | 5 refills | Status: DC

## 2020-10-25 NOTE — Progress Notes (Signed)
Patient ID: Taylor Silva, female    DOB: 1955-12-29  Age: 65 y.o. MRN: [id number]    Subjective:   Chief Complaint  Patient presents with   Follow-up    Neck pain   Subjective   HPI LEONARDO PLAIA presents for office visit today for follow up on htn and Asthma. She has c/o neck pain that radiates to her ears with tinnitus. She states that the tinnitus/pain is worse in right ear compared to left ear. Her tinnitus in her right ear is worsening. She was px clindamycin September 2nd and reports that it did not help. She had a lot of congestion when it initially started. Her neck pain keeps her awake. She states that her TMJ strain is contributing to her neck pain. She has tried using mouthguards but they did not help. Denies CP/palp/SOB/HA/congestion/fevers/GI or GU c/o. Taking meds as prescribed.  She also has c/o losing her voice sometimes especially in the afternoon and her voice feels raspy. She endorses drinking enough water.   Since catching covid last year she lost her sense of taste and smell and taste did did not come back.  Has frequent falls latest was when she fell on left knee and got bruised. Expresses interest in right knee surgery but needs to lose weight. Has tried weight loss surgery before but it did not work and she also tried multiple restricted food diets but it did not work either. She notes that her Miralax regiment have improved her BM's and constipation has subsided.  She reports that Propranolol was successful for alleviating her HA's and states that she only takes one pill daily.  Her husband has stage 4 kidney failure and is legally blind and recently had surgery so she is dealing with a lot of stress.   Review of Systems  Constitutional:  Negative for chills, fatigue and fever.  HENT:  Positive for ear pain and tinnitus (right > left). Negative for congestion, rhinorrhea, sinus pressure, sinus pain and sore throat.   Eyes:  Negative for pain.   Respiratory:  Negative for cough and shortness of breath.   Cardiovascular:  Negative for chest pain, palpitations and leg swelling.  Gastrointestinal:  Negative for abdominal pain, blood in stool, diarrhea and vomiting.  Genitourinary:  Negative for decreased urine volume, flank pain, frequency, vaginal bleeding and vaginal discharge.  Musculoskeletal:  Positive for neck pain. Negative for back pain.  Neurological:  Negative for headaches.   History Past Medical History:  Diagnosis Date   Arthritis    Asthma    Depression    DJD (degenerative joint disease), lumbar    Fructose intolerance    GERD (gastroesophageal reflux disease)    H/O hiatal hernia    Hard of hearing    bilat    Headache    migraines   Heart murmur    echo done 80's neg   History of bronchitis    History of frequent urinary tract infections    History of vertigo    Hyperlipidemia    Hypertension    currently taking no medications    Obesity    Pinched nerve    in back   PONV (postoperative nausea and vomiting)    patch used last time   Pre-diabetes    Restless legs    Seasonal allergies    Sleep apnea    Intolerant to CPAP last sleep study   Tinnitus    Wears glasses     She has a  past surgical history that includes Knee surgery (04/2009); Wrist surgery (1980s); Abdominal hysterectomy (1995); Other surgical history (1995?); thigh tuck (Bilateral); ORIF ankle fracture (Right, 10/17/2013); Breath tek h pylori (N/A, 06/23/2014); Diagnostic laparoscopy; Laparoscopic gastric sleeve resection with hiatal hernia repair (N/A, 10/03/2014); Upper gi endoscopy (N/A, 10/03/2014); Dilation and curettage of uterus; and Cholecystectomy (N/A, 07/12/2015).   Her family history includes Breast cancer in an other family member; Diabetes in her brother, maternal grandmother, mother, and sister; Heart attack in her brother and mother; Heart disease in her father; Hyperlipidemia in her brother, maternal grandmother, mother, and  sister; Hypertension in her brother, maternal grandmother, mother, and sister; Leukemia in her father.She reports that she has never smoked. She has never used smokeless tobacco. She reports that she does not drink alcohol and does not use drugs.  Current Outpatient Medications on File Prior to Visit  Medication Sig Dispense Refill   ACETAMINOPHEN-BUTALBITAL 50-325 MG TABS Take 1 tablet by mouth three times daily as needed 40 tablet 0   Albuterol Sulfate (PROAIR RESPICLICK) 790 (90 Base) MCG/ACT AEPB Inhale 2 puffs into the lungs 4 (four) times daily as needed. 1 each 3   Azelastine HCl 137 MCG/SPRAY SOLN USE 2 SPRAY(S) IN EACH NOSTRIL TWICE DAILY AS DIRECTED 30 mL 0   Calcium Citrate-Vitamin D (CALCIUM CITRATE+D3 PETITES PO) Take 6 tablets by mouth daily.     cetirizine (ZYRTEC) 10 MG tablet Take 10 mg by mouth daily.     montelukast (SINGULAIR) 10 MG tablet Take 1 tablet (10 mg total) by mouth at bedtime as needed. 90 tablet 1   Multiple Vitamins-Minerals (MULTIVITAMIN WITH MINERALS) tablet Take 2 tablets by mouth daily.     Omega-3 Fatty Acids (FISH OIL PO) Take 1 capsule by mouth daily.      VITAMIN D, CHOLECALCIFEROL, PO Take 5,000 Units by mouth 2 (two) times daily.      No current facility-administered medications on file prior to visit.     Objective:  Objective  Physical Exam Constitutional:      General: She is not in acute distress.    Appearance: Normal appearance. She is not ill-appearing or toxic-appearing.  HENT:     Head: Normocephalic and atraumatic.     Right Ear: Tympanic membrane, ear canal and external ear normal.     Left Ear: Tympanic membrane, ear canal and external ear normal.     Nose: No congestion or rhinorrhea.  Eyes:     Extraocular Movements: Extraocular movements intact.     Pupils: Pupils are equal, round, and reactive to light.  Cardiovascular:     Rate and Rhythm: Normal rate and regular rhythm.     Pulses: Normal pulses.     Heart sounds: Normal  heart sounds. No murmur heard. Pulmonary:     Effort: Pulmonary effort is normal. No respiratory distress.     Breath sounds: Normal breath sounds. No wheezing, rhonchi or rales.  Abdominal:     General: Bowel sounds are normal.     Palpations: Abdomen is soft. There is no mass.     Tenderness: There is no abdominal tenderness. There is no guarding.     Hernia: No hernia is present.  Musculoskeletal:        General: Normal range of motion.     Cervical back: Normal range of motion and neck supple.  Skin:    General: Skin is warm and dry.  Neurological:     Mental Status: She is alert and oriented to  person, place, and time.  Psychiatric:        Behavior: Behavior normal.   BP 112/68   Pulse 77   Temp 98 F (36.7 C)   Resp 16   Wt 251 lb 12.8 oz (114.2 kg)   SpO2 96%   BMI 39.44 kg/m  Wt Readings from Last 3 Encounters:  10/25/20 251 lb 12.8 oz (114.2 kg)  02/08/20 262 lb (118.8 kg)  01/17/20 259 lb (117.5 kg)     Lab Results  Component Value Date   WBC 6.3 10/25/2020   HGB 13.3 10/25/2020   HCT 40.3 10/25/2020   PLT 206.0 10/25/2020   GLUCOSE 93 10/25/2020   CHOL 212 (H) 10/25/2020   TRIG 83.0 10/25/2020   HDL 60.50 10/25/2020   LDLCALC 135 (H) 10/25/2020   ALT 14 10/25/2020   AST 19 10/25/2020   NA 139 10/25/2020   K 4.4 10/25/2020   CL 102 10/25/2020   CREATININE 0.77 10/25/2020   BUN 15 10/25/2020   CO2 31 10/25/2020   TSH 2.50 02/21/2019   HGBA1C 6.0 02/21/2019    DG Abd 1 View  Result Date: 02/08/2020 CLINICAL DATA:  Right lower quadrant pain.  Recent constipation. EXAM: ABDOMEN - 1 VIEW COMPARISON:  Lumbar spine 10/11/2015.  Ultrasound 05/28/2015. FINDINGS: Surgical clips noted in the upper abdomen. Large amount of stool noted in colon. Constipation could present this fashion. No bowel distention or free air. Pelvic calcifications consistent phleboliths. Degenerative changes lumbar spine and both hips. No acute bony abnormality. No evidence of fracture  dislocation. IMPRESSION: 1. Large amount of stool noted in the colon. Constipation could present in this fashion. 2. No acute abnormality identified. Electronically Signed   By: Marcello Moores  Register   On: 02/08/2020 10:28     Assessment & Plan:  Plan    Meds ordered this encounter  Medications   tiZANidine (ZANAFLEX) 2 MG tablet    Sig: Take 0.5-2 tablets (1-4 mg total) by mouth at bedtime as needed for muscle spasms.    Dispense:  30 tablet    Refill:  2   propranolol (INDERAL) 20 MG tablet    Sig: Take 1 tablet (20 mg total) by mouth 2 (two) times daily.    Dispense:  60 tablet    Refill:  5   DULoxetine (CYMBALTA) 30 MG capsule    Sig: Take 1 capsule (30 mg total) by mouth daily.    Dispense:  90 capsule    Refill:  1   DULoxetine (CYMBALTA) 60 MG capsule    Sig: Take 1 capsule (60 mg total) by mouth daily.    Dispense:  90 capsule    Refill:  1    Will take with a 30 mg cap daily   gabapentin (NEURONTIN) 600 MG tablet    Sig: Take 1 tablet (600 mg total) by mouth 3 (three) times daily.    Dispense:  180 tablet    Refill:  1    Problem List Items Addressed This Visit     Depression    Stable on current dose of Duloxetine      Relevant Medications   DULoxetine (CYMBALTA) 30 MG capsule   DULoxetine (CYMBALTA) 60 MG capsule   Encounter for screening for malignant neoplasm of breast    MGM ordered      Relevant Orders   MM 3D SCREEN BREAST BILATERAL   Obesity, Class III, BMI 40-49.9 (morbid obesity) (HCC)    Encouraged DASH or MIND diet, decrease  po intake and increase exercise as tolerated. Needs 7-8 hours of sleep nightly. Avoid trans fats, eat small, frequent meals every 4-5 hours with lean proteins, complex carbs and healthy fats. Minimize simple carbs, high fat foods and processed foods      Hyperglycemia    hgba1c acceptable, minimize simple carbs. Increase exercise as tolerated.       Hypertension    Well controlled, no changes to meds. Encouraged heart  healthy diet such as the DASH diet and exercise as tolerated.       Relevant Medications   propranolol (INDERAL) 20 MG tablet   Other Relevant Orders   Lipid panel (Completed)   Comprehensive metabolic panel (Completed)   CBC with Differential/Platelet (Completed)   Hyperlipidemia - Primary    Encourage heart healthy diet such as MIND or DASH diet, increase exercise, avoid trans fats, simple carbohydrates and processed foods, consider a krill or fish or flaxseed oil cap daily.       Relevant Medications   propranolol (INDERAL) 20 MG tablet   Other Relevant Orders   Lipid panel (Completed)   Comprehensive metabolic panel (Completed)   CBC with Differential/Platelet (Completed)   Neck pain    Check an xray. Encouraged moist heat and gentle stretching as tolerated. May try NSAIDs and prescription meds as directed and report if symptoms worsen or seek immediate care      Relevant Orders   DG Cervical Spine Complete   DM TEMPLATE   Other Visit Diagnoses     High risk medication use       Relevant Orders   Drug Monitoring Panel [phone] , Urine   Encounter for hepatitis C screening test for low risk patient       Relevant Orders   Hepatitis C antibody (Completed)   Screen for STD (sexually transmitted disease)       Relevant Orders   HIV Antibody (routine testing w rflx) (Completed)   Colon cancer screening       Relevant Orders   Cologuard       Follow-up: Return in about 3 months (around 01/25/2021) for f/u visit then annual .  I, Suezanne Jacquet, acting as a scribe for Penni Homans, MD, have documented all relevent documentation on behalf of Penni Homans, MD, as directed by Penni Homans, MD while in the presence of Penni Homans, MD. DO:10/26/20.  I, Mosie Lukes, MD personally performed the services described in this documentation. All medical record entries made by the scribe were at my direction and in my presence. I have reviewed the chart and agree that the record  reflects my personal performance and is accurate and complete

## 2020-10-25 NOTE — Patient Instructions (Addendum)
Apply lidocaine and moist hot wash cloth when neck pain worsens Check cbddaily website for cbd creams, [employer] website  [phone] and Kirke Shaggy are the weight loss medications. Check with insurance and pharmacy if covered then contact us for prescription Mancel Parsons is an injectable once a week Kirke Shaggy is a pill you take everyday  New bivalent covid shot available at any pharmacy. [facility] pharmacy downstairs offers it Mon-Fri 9am-3pm with walk-ins  Shingrix is the new shingles shot, 2 shots over 2-6 months, confirm coverage with insurance and document, then can return here for shots with nurse appt or at pharmacy  PVC 13 pneumonia shot is due

## 2020-10-26 DIAGNOSIS — M542 Cervicalgia: Secondary | ICD-10-CM | POA: Insufficient documentation

## 2020-10-26 LAB — COMPREHENSIVE METABOLIC PANEL
ALT: 14 U/L (ref 0–35)
AST: 19 U/L (ref 0–37)
Albumin: 4.1 g/dL (ref 3.5–5.2)
Alkaline Phosphatase: 88 U/L (ref 39–117)
BUN: 15 mg/dL (ref 6–23)
CO2: 31 mEq/L (ref 19–32)
Calcium: 9.8 mg/dL (ref 8.4–10.5)
Chloride: 102 mEq/L (ref 96–112)
Creatinine, Ser: 0.77 mg/dL (ref 0.40–1.20)
GFR: 81.62 mL/min (ref >60.00)
Glucose, Bld: 93 mg/dL (ref 70–99)
Potassium: 4.4 mEq/L (ref 3.5–5.1)
Sodium: 139 mEq/L (ref 135–145)
Total Bilirubin: 0.4 mg/dL (ref 0.2–1.2)
Total Protein: 6.6 g/dL (ref 6.0–8.3)

## 2020-10-26 LAB — CBC WITH DIFFERENTIAL/PLATELET
Basophils Absolute: 0.1 10*3/uL (ref 0.0–0.1)
Basophils Relative: 1.5 % (ref 0.0–3.0)
Eosinophils Absolute: 0.2 10*3/uL (ref 0.0–0.7)
Eosinophils Relative: 2.5 % (ref 0.0–5.0)
HCT: 40.3 % (ref 36.0–46.0)
Hemoglobin: 13.3 g/dL (ref 12.0–15.0)
Lymphocytes Relative: 51.8 % — ABNORMAL HIGH (ref 12.0–46.0)
Lymphs Abs: 3.3 10*3/uL (ref 0.7–4.0)
MCHC: 33 g/dL (ref 30.0–36.0)
MCV: 92.7 fl (ref 78.0–100.0)
Monocytes Absolute: 0.4 10*3/uL (ref 0.1–1.0)
Monocytes Relative: 7.1 % (ref 3.0–12.0)
Neutro Abs: 2.3 10*3/uL (ref 1.4–7.7)
Neutrophils Relative %: 37.1 % — ABNORMAL LOW (ref 43.0–77.0)
Platelets: 206 10*3/uL (ref 150.0–400.0)
RBC: 4.35 Mil/uL (ref 3.87–5.11)
RDW: 13.1 % (ref 11.5–15.5)
WBC: 6.3 10*3/uL (ref 4.0–10.5)

## 2020-10-26 LAB — LIPID PANEL
Cholesterol: 212 mg/dL — ABNORMAL HIGH (ref 0–200)
HDL: 60.5 mg/dL (ref >39.00)
LDL Cholesterol: 135 mg/dL — ABNORMAL HIGH (ref 0–99)
NonHDL: 151.59
Total CHOL/HDL Ratio: 4
Triglycerides: 83 mg/dL (ref 0.0–149.0)
VLDL: 16.6 mg/dL (ref 0.0–40.0)

## 2020-10-26 NOTE — Assessment & Plan Note (Signed)
Stable on current dose of Duloxetine

## 2020-10-26 NOTE — Assessment & Plan Note (Signed)
hgba1c acceptable, minimize simple carbs. Increase exercise as tolerated.  

## 2020-10-26 NOTE — Assessment & Plan Note (Signed)
MGM ordered 

## 2020-10-26 NOTE — Assessment & Plan Note (Signed)
Encourage heart healthy diet such as MIND or DASH diet, increase exercise, avoid trans fats, simple carbohydrates and processed foods, consider a krill or fish or flaxseed oil cap daily.  °

## 2020-10-26 NOTE — Assessment & Plan Note (Signed)
Encouraged DASH or MIND diet, decrease po intake and increase exercise as tolerated. Needs 7-8 hours of sleep nightly. Avoid trans fats, eat small, frequent meals every 4-5 hours with lean proteins, complex carbs and healthy fats. Minimize simple carbs, high fat foods and processed foods 

## 2020-10-26 NOTE — Assessment & Plan Note (Signed)
Check an xray. Encouraged moist heat and gentle stretching as tolerated. May try NSAIDs and prescription meds as directed and report if symptoms worsen or seek immediate care

## 2020-10-26 NOTE — Assessment & Plan Note (Signed)
Well controlled, no changes to meds. Encouraged heart healthy diet such as the DASH diet and exercise as tolerated.  °

## 2020-10-31 LAB — DRUG MONITORING PANEL 376104, URINE
Amobarbital: NEGATIVE ng/mL (ref <100)
Amphetamines: NEGATIVE ng/mL (ref <500)
Barbiturates: POSITIVE ng/mL — AB (ref <300)
Benzodiazepines: NEGATIVE ng/mL (ref <100)
Butalbital: 224 ng/mL — ABNORMAL HIGH (ref <100)
Cocaine Metabolite: NEGATIVE ng/mL (ref <150)
Desmethyltramadol: NEGATIVE ng/mL (ref <100)
Opiates: NEGATIVE ng/mL (ref <100)
Oxycodone: NEGATIVE ng/mL (ref <100)
Pentobarbital: NEGATIVE ng/mL (ref <100)
Phenobarbital: NEGATIVE ng/mL (ref <100)
Secobarbital: NEGATIVE ng/mL (ref <100)
Tramadol: NEGATIVE ng/mL (ref <100)

## 2020-10-31 LAB — HEPATITIS C ANTIBODY
Hepatitis C Ab: NONREACTIVE
SIGNAL TO CUT-OFF: 0.07 (ref <1.00)

## 2020-10-31 LAB — DM TEMPLATE

## 2020-10-31 LAB — HIV ANTIBODY (ROUTINE TESTING W REFLEX): HIV 1&2 Ab, 4th Generation: NONREACTIVE

## 2020-11-19 LAB — COLOGUARD: COLOGUARD: NEGATIVE

## 2020-12-10 ENCOUNTER — Ambulatory Visit (HOSPITAL_BASED_OUTPATIENT_CLINIC_OR_DEPARTMENT_OTHER): Payer: Managed Care, Other (non HMO)

## 2021-04-20 ENCOUNTER — Other Ambulatory Visit: Payer: Self-pay | Admitting: Family Medicine

## 2021-09-24 ENCOUNTER — Telehealth: Payer: Self-pay | Admitting: *Deleted

## 2021-09-24 NOTE — Telephone Encounter (Signed)
Tct-Vertie checking on her after recent loss of husband.  She states that she is doing okay with occasion hard days.  Encouraged her to call and talk if hse gets depressed or if she needs anything.

## 2021-10-16 ENCOUNTER — Ambulatory Visit
Admission: RE | Admit: 2021-10-16 | Discharge: 2021-10-16 | Disposition: A | Discharge status: Home / Self Care | Payer: Medicare Other | Source: Ambulatory Visit | Attending: Family Medicine | Admitting: Family Medicine

## 2021-10-16 ENCOUNTER — Other Ambulatory Visit: Payer: Self-pay | Admitting: Family Medicine

## 2021-10-16 DIAGNOSIS — M25552 Pain in left hip: Secondary | ICD-10-CM

## 2022-02-20 ENCOUNTER — Encounter: Payer: Self-pay | Admitting: Adult Health

## 2022-02-20 ENCOUNTER — Ambulatory Visit (INDEPENDENT_AMBULATORY_CARE_PROVIDER_SITE_OTHER): Payer: Medicare Other | Admitting: Adult Health

## 2022-02-20 VITALS — BP 134/93 | HR 88 | Ht 66.0 in | Wt 252.0 lb

## 2022-02-20 DIAGNOSIS — F331 Major depressive disorder, recurrent, moderate: Secondary | ICD-10-CM | POA: Diagnosis not present

## 2022-02-20 DIAGNOSIS — F411 Generalized anxiety disorder: Secondary | ICD-10-CM

## 2022-02-20 DIAGNOSIS — G4700 Insomnia, unspecified: Secondary | ICD-10-CM

## 2022-02-20 DIAGNOSIS — F4321 Adjustment disorder with depressed mood: Secondary | ICD-10-CM | POA: Diagnosis not present

## 2022-02-20 DIAGNOSIS — F4310 Post-traumatic stress disorder, unspecified: Secondary | ICD-10-CM

## 2022-02-20 MED ORDER — HYDROXYZINE HCL 25 MG PO TABS: ORAL_TABLET | ORAL | 2 refills | Status: DC

## 2022-02-20 MED ORDER — BUPROPION HCL ER (XL) 150 MG PO TB24: 150.0000 mg | ORAL_TABLET | Freq: Every day | ORAL | 2 refills | Status: DC

## 2022-02-20 NOTE — Progress Notes (Signed)
[facility] MD/PA/NP Initial Note  02/20/2022 4:18 PM Taylor Silva  MRN:  [id number]  Chief Complaint:   HPI:   Patient seen today for initial psychiatric evaluation.   Referred by PCP for medication management.  Taylor Silva is a 67 y/o female seen today for medication management and therapy services.   Reports losing her husband and 70 year old dog in late 2023. Has struggled with emotional stability since the losses. Describes mood today as "not too good". Pleasant. Tearful. Mood symptoms - reports depression, anxiety, and irritability. Reports worry, rumination, and over thinking. Mood is lower. Stating "I don't want to do much of anything". Reports husband of 21 tears passed away in 10-11-22 - kidney failure. She then lost her dog of 17 years in November. Stating "things have been very hard for me". Was working full time up until she had to assume full care of husband. Stating "I feel like I've lost my purpose". Willing to consider other treatment options - medication and therapy. Reports financial stressors. Her son, his wife, and grand-daughter moved in with her to help out.  Energy levels lower. Active, does not have a regular exercise routine - physical limitations.   Enjoys some usual interests and activities. Spending time with family. Appetite adequate. Weight stable. Reports decreased. Averages 5 hours. Has tried Trazadone and did not tolerate it. Focus and concentration stable. Completing tasks. Managing aspects of household. 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  Denies SI or HI.  Denies AH or VH. Ringing in her ear - tinnitus. Denies substance use.  Denies self harm.  Previous medication trials: Cymbalta, Lexapro, Prozac, Zoloft, Wellbutrin, Cymbalta, Xanax, Wellbutrin, Trazadone.  Visit Diagnosis:    ICD-10-CM   1. Major depressive disorder, recurrent episode, moderate (HCC)  F33.1 buPROPion (WELLBUTRIN XL) 150 MG 24 hr tablet    2. Generalized  anxiety disorder  F41.1 hydrOXYzine (ATARAX) 25 MG tablet    buPROPion (WELLBUTRIN XL) 150 MG 24 hr tablet    3. Grief  F43.21 buPROPion (WELLBUTRIN XL) 150 MG 24 hr tablet    4. Insomnia, unspecified type  G47.00     5. PTSD (post-traumatic stress disorder)  F43.10       Past Psychiatric History: Denies psychiatric hospitalization.   Past Medical History:  Past Medical History:  Diagnosis Date   Arthritis    Asthma    Depression    DJD (degenerative joint disease), lumbar    Fructose intolerance    GERD (gastroesophageal reflux disease)    H/O hiatal hernia    Hard of hearing    bilat    Headache    migraines   Heart murmur    echo done 80's neg   History of bronchitis    History of frequent urinary tract infections    History of vertigo    Hyperlipidemia    Hypertension    currently taking no medications    Obesity    Pinched nerve    in back   PONV (postoperative nausea and vomiting)    patch used last time   Pre-diabetes    Restless legs    Seasonal allergies    Sleep apnea    Intolerant to CPAP last sleep study   Tinnitus    Wears glasses     Past Surgical History:  Procedure Laterality Date   ABDOMINAL HYSTERECTOMY  1995   No oophorectomy   BREATH TEK H PYLORI N/A 06/23/2014   Procedure: BREATH TEK H PYLORI;  Surgeon: Greer Pickerel, MD;  Location: [facility];  Service: General;  Laterality: N/A;   CHOLECYSTECTOMY N/A 07/12/2015   Procedure: LAPAROSCOPIC CHOLECYSTECTOMY WITH INTRAOPERATIVE CHOLANGIOGRAM;  Surgeon: Greer Pickerel, MD;  Location: [facility];  Service: General;  Laterality: N/A;   DIAGNOSTIC LAPAROSCOPY     DILATION AND CURETTAGE OF UTERUS     KNEE SURGERY  04/2009   right knee, meniscus tear   LAPAROSCOPIC GASTRIC SLEEVE RESECTION WITH HIATAL HERNIA REPAIR N/A 10/03/2014   Procedure: LAPAROSCOPIC GASTRIC SLEEVE RESECTION WITH HIATAL HERNIA REPAIR W UPPER ENDO;  Surgeon: Greer Pickerel, MD;  Location: [facility];  Service: General;  Laterality: N/A;    ORIF ANKLE FRACTURE Right 10/17/2013   Procedure: OPEN REDUCTION INTERNAL FIXATION (ORIF) RIGHT ANKLE DISTAL FIBULA/LATERAL MALLEOLUS;  Surgeon: Vickey Huger, MD;  Location: [facility];  Service: Orthopedics;  Laterality: Right;   OTHER SURGICAL HISTORY  1995?   removed scar tissue from colon   thigh tuck Bilateral    inner thigh tuck   UPPER GI ENDOSCOPY N/A 10/03/2014   Procedure: UPPER GI ENDOSCOPY;  Surgeon: Greer Pickerel, MD;  Location: [facility];  Service: General;  Laterality: N/A;   WRIST SURGERY  1980s   cyst removed from right wrist    Family Psychiatric History: Family history of mental illness.   Family History:  Family History  Problem Relation Age of Onset   Diabetes Mother    Hyperlipidemia Mother    Hypertension Mother    Heart attack Mother        64 y/o   Diabetes Sister    Hyperlipidemia Sister    Hypertension Sister    Diabetes Brother    Hyperlipidemia Brother    Hypertension Brother    Heart attack Brother    Heart disease Father    Leukemia Father    Diabetes Maternal Grandmother    Hyperlipidemia Maternal Grandmother    Hypertension Maternal Grandmother    Breast cancer Other        GM   Colon cancer Neg Hx     Social History:  Social History   Socioeconomic History   Marital status: Widowed    Spouse name: Not on file   Number of children: 1   Years of education: Not on file   Highest education level: Not on file  Occupational History   Occupation: Quality control ccordinator  Tobacco Use   Smoking status: Never   Smokeless tobacco: Never  Substance and Sexual Activity   Alcohol use: No   Drug use: No   Sexual activity: Not on file  Other Topics Concern   Not on file  Social History Narrative   Lives with husband who is transplant patient   Completed some college            Social Determinants of Health   Financial Resource Strain: Not on file  Food Insecurity: Not on file  Transportation Needs: Not on file  Physical Activity: Not on  file  Stress: Not on file  Social Connections: Not on file    Allergies:  Allergies  Allergen Reactions   Codeine Anaphylaxis   Erythromycin Hives   Tetracyclines & Related Hives   Darvon [Propoxyphene] Photosensitivity    Blurry vision for 3 days   Morphine And Related Dermatitis    "burning in veins"   Other Other (See Comments)    Tomatoes upset stomach, itchy     Penicillins Hives    Has patient had a PCN reaction causing immediate rash,  facial/tongue/throat swelling, SOB or lightheadedness with hypotension: Yes Has patient had a PCN reaction causing severe rash involving mucus membranes or skin necrosis: No Has patient had a PCN reaction that required hospitalization No Has patient had a PCN reaction occurring within the last 10 years: No If all of the above answers are "NO", then may proceed with Cephalosporin use.    Sulfa Antibiotics Nausea And Vomiting    Metabolic Disorder Labs: Lab Results  Component Value Date   HGBA1C 6.0 02/21/2019   MPG 111 07/11/2016   No results found for: "PROLACTIN" Lab Results  Component Value Date   CHOL 212 (H) 10/25/2020   TRIG 83.0 10/25/2020   HDL 60.50 10/25/2020   CHOLHDL 4 10/25/2020   VLDL 16.6 10/25/2020   LDLCALC 135 (H) 10/25/2020   LDLCALC 142 (H) 03/09/2018   Lab Results  Component Value Date   TSH 2.50 02/21/2019   TSH 1.84 03/09/2018    Therapeutic Level Labs: No results found for: "LITHIUM" No results found for: "VALPROATE" No results found for: "CBMZ"  Current Medications: Current Outpatient Medications  Medication Sig Dispense Refill   buPROPion (WELLBUTRIN XL) 150 MG 24 hr tablet Take 1 tablet (150 mg total) by mouth daily. 30 tablet 2   hydrOXYzine (ATARAX) 25 MG tablet Take one to two tablets during the day and 1 to 2 tablets at bedtime. 120 tablet 2   ACETAMINOPHEN-BUTALBITAL 50-325 MG TABS Take 1 tablet by mouth three times daily as needed 40 tablet 0   Albuterol Sulfate (PROAIR RESPICLICK) 123XX123  (90 Base) MCG/ACT AEPB Inhale 2 puffs into the lungs 4 (four) times daily as needed. 1 each 3   Azelastine HCl 137 MCG/SPRAY SOLN USE 2 SPRAY(S) IN EACH NOSTRIL TWICE DAILY AS DIRECTED 30 mL 0   Calcium Citrate-Vitamin D (CALCIUM CITRATE+D3 PETITES PO) Take 6 tablets by mouth daily.     cetirizine (ZYRTEC) 10 MG tablet Take 10 mg by mouth daily.     DULoxetine (CYMBALTA) 30 MG capsule Take 1 capsule (30 mg total) by mouth daily. 90 capsule 1   DULoxetine (CYMBALTA) 60 MG capsule Take 1 capsule (60 mg total) by mouth daily. 90 capsule 1   gabapentin (NEURONTIN) 600 MG tablet TAKE 1 TABLET BY MOUTH THREE TIMES DAILY 90 tablet 0   montelukast (SINGULAIR) 10 MG tablet Take 1 tablet (10 mg total) by mouth at bedtime as needed. 90 tablet 1   Multiple Vitamins-Minerals (MULTIVITAMIN WITH MINERALS) tablet Take 2 tablets by mouth daily.     Omega-3 Fatty Acids (FISH OIL PO) Take 1 capsule by mouth daily.      propranolol (INDERAL) 20 MG tablet Take 1 tablet (20 mg total) by mouth 2 (two) times daily. 60 tablet 5   tiZANidine (ZANAFLEX) 2 MG tablet Take 0.5-2 tablets (1-4 mg total) by mouth at bedtime as needed for muscle spasms. 30 tablet 2   VITAMIN D, CHOLECALCIFEROL, PO Take 5,000 Units by mouth 2 (two) times daily.      No current facility-administered medications for this visit.    Medication Side Effects: none  Orders placed this visit:  No orders of the defined types were placed in this encounter.   Psychiatric Specialty Exam:  Review of Systems  Musculoskeletal:  Negative for gait problem.  Neurological:  Negative for tremors.  Psychiatric/Behavioral:         Please refer to HPI    There were no vitals taken for this visit.There is no height or weight on file  to calculate BMI.  General Appearance: Casual and Neat  Eye Contact:  Good  Speech:  Clear and Coherent and Normal Rate  Volume:  Normal  Mood:  Anxious and Depressed  Affect:  Appropriate and Congruent  Thought Process:   Coherent and Descriptions of Associations: Intact  Orientation:  Full (Time, Place, and Person)  Thought Content: Logical   Suicidal Thoughts:  No  Homicidal Thoughts:  No  Memory:  WNL  Judgement:  Good  Insight:  Good  Psychomotor Activity:  Normal  Concentration:  Concentration: Good and Attention Span: Good  Recall:  Good  Fund of Knowledge: Good  Language: Good  Assets:  Communication Skills Desire for Improvement Financial Resources/Insurance Housing Intimacy Leisure Time Physical Health Resilience Social Support Talents/Skills Transportation Vocational/Educational  ADL's:  Intact  Cognition: WNL  Prognosis:  Good   Screenings:  GAD-7    Flowsheet Row Office Visit from 12/21/2019 in [facility]  Total GAD-7 Score 20      PHQ2-9    [place] Office Visit from 01/17/2020 in [facility] [place] Visit from 12/21/2019 in [facility] [place] from 03/17/2019 in [facility] [place] from 03/09/2018 in [facility] [place] from 10/17/2014 in [place] [facility] Total Score 1 6 0 6 0  PHQ-9 Total Score 10 26 -- 22 --       Receiving Psychotherapy: No   Treatment Plan/Recommendations:   Plan:  PDMP reviewed  Continue Wellbutrin XL 179m every morning - previously breaking  a 3086min half and taking it in the late afternoon.  Add Hydroxyzine 2523m up to 4 times daily - take one to two at bedtime for sleep - may also take 1/2 tablet during the day for anxiety.  D/C Xanax 0.28m4mD - has not taken in three weeks.  Set up with therapist - DebbRinaldo CloudTime spent with patient was 60 minutes. Greater than 50% of face to face time with patient was spent on counseling and  coordination of care.    RTC 4 weeks  Patient advised to contact office with any questions, adverse effects, or acute worsening in signs and symptoms.    RegiAloha Gell

## 2022-03-11 ENCOUNTER — Ambulatory Visit (INDEPENDENT_AMBULATORY_CARE_PROVIDER_SITE_OTHER): Payer: Medicare Other | Admitting: Psychiatry

## 2022-03-11 DIAGNOSIS — F331 Major depressive disorder, recurrent, moderate: Secondary | ICD-10-CM
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
## 2022-03-20 ENCOUNTER — Ambulatory Visit (INDEPENDENT_AMBULATORY_CARE_PROVIDER_SITE_OTHER): Payer: Medicare Other | Admitting: Adult Health

## 2022-03-20 ENCOUNTER — Encounter: Payer: Self-pay | Admitting: Adult Health

## 2022-03-20 DIAGNOSIS — F331 Major depressive disorder, recurrent, moderate: Secondary | ICD-10-CM

## 2022-03-20 DIAGNOSIS — F411 Generalized anxiety disorder: Secondary | ICD-10-CM

## 2022-03-20 DIAGNOSIS — F4321 Adjustment disorder with depressed mood: Secondary | ICD-10-CM | POA: Diagnosis not present

## 2022-03-20 DIAGNOSIS — G4700 Insomnia, unspecified: Secondary | ICD-10-CM | POA: Diagnosis not present

## 2022-03-20 MED ORDER — FLUOXETINE HCL 20 MG PO CAPS: 20.0000 mg | ORAL_CAPSULE | Freq: Every day | ORAL | 2 refills | Status: DC

## 2022-03-20 NOTE — Progress Notes (Signed)
Taylor Silva:[id number] 1955/08/29 67 y.o.  Subjective:   Patient ID:  Taylor Silva is a 67 y.o. (DOB [birth year]) female.  Chief Complaint: No chief complaint on file.   HPI Taylor Silva presents to the office today for follow-up of MDD, GAD, insomnia and grief.  Referred by PCP for medication management.  Reports losing her husband and 45 year old dog in late 2023. Has struggled with emotional stability since the losses.   Describes mood today as "not too good". Pleasant. Tearful. Mood symptoms - reports depression, anxiety, and irritability. Reports worry, rumination, and over thinking. Reports perfectionism. Mood is lower. Reports husband of 21 tears passed away in 01-Oct-2022 - kidney failure. She then lost her dog of 17 years in November. Stating "I'm not feeling any better". Has been taking the Wellbutrin XL '150mg'$  daily and feels like it is making her more irritable. Willing to consider other options. Energy levels lower. Active, does not have a regular exercise routine - physical limitations.   Enjoys some usual interests and activities. Her son, his wife, and grand-daughter moved in with her to help out. Spending time with family. Appetite adequate. Weight stable. Sleep has improved. Averages 5 to 6 hours. Denies daytime napping.  Focus and concentration stable. Completing tasks. Managing aspects of household. 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  Denies SI or HI.  Denies AH or VH. Ringing in her ear - tinnitus. Denies substance use.  Denies self harm.  Seeing therapist - Taylor Silva  Previous medication trials: Cymbalta, Lexapro, Prozac, Zoloft, Wellbutrin,  Xanax, Wellbutrin, Trazadone.    Taylor Silva Office Visit from 12/21/2019 in [facility]  Total GAD-7 Score 20      PHQ2-9    [place] Visit from 01/17/2020 in [facility] [place] Visit from 12/21/2019 in [facility] [place] from 03/17/2019 in [place] at [place] from 03/09/2018 in [facility] [place] from 10/17/2014 in [place] at [facility] Total Score 1 6 0 6 0  PHQ-9 Total Score 10 26 -- 22 --        Review of Systems:  Review of Systems  Musculoskeletal:  Negative for gait problem.  Neurological:  Negative for tremors.  Psychiatric/Behavioral:         Please refer to HPI    Medications: I have reviewed the patient's current medications.  Current Outpatient Medications  Medication Sig Dispense Refill   ACETAMINOPHEN-BUTALBITAL 50-325 MG TABS Take 1 tablet by mouth three times daily as needed 40 tablet 0   Albuterol Sulfate (PROAIR RESPICLICK) [id number] (90 Base) MCG/ACT AEPB Inhale 2 puffs into the lungs 4 (four) times daily as needed. 1 each 3   Azelastine HCl 137 MCG/SPRAY SOLN USE 2 SPRAY(S) IN EACH NOSTRIL TWICE DAILY AS DIRECTED 30 mL 0   buPROPion (WELLBUTRIN XL) 150 MG 24 hr tablet Take 1 tablet (150 mg total) by mouth daily. 30 tablet 2   Calcium Citrate-Vitamin D (CALCIUM CITRATE+D3 PETITES PO) Take 6 tablets by mouth daily.     cetirizine (ZYRTEC) 10 MG tablet Take 10 mg by mouth daily.     DULoxetine (CYMBALTA) 30 MG capsule Take 1 capsule (30 mg total) by mouth daily. 90 capsule 1  DULoxetine (CYMBALTA) 60 MG capsule Take 1 capsule (60 mg total) by mouth daily. 90 capsule 1   gabapentin (NEURONTIN) 600 MG tablet TAKE 1 TABLET BY MOUTH THREE TIMES DAILY 90 tablet 0   hydrOXYzine (ATARAX) 25 MG tablet Take one to two tablets during the day and 1 to 2 tablets at bedtime. 120 tablet 2   montelukast (SINGULAIR) 10 MG tablet Take 1 tablet (10 mg total) by mouth at bedtime as needed. 90 tablet 1   Multiple Vitamins-Minerals (MULTIVITAMIN WITH  MINERALS) tablet Take 2 tablets by mouth daily.     Omega-3 Fatty Acids (FISH OIL PO) Take 1 capsule by mouth daily.      propranolol (INDERAL) 20 MG tablet Take 1 tablet (20 mg total) by mouth 2 (two) times daily. 60 tablet 5   tiZANidine (ZANAFLEX) 2 MG tablet Take 0.5-2 tablets (1-4 mg total) by mouth at bedtime as needed for muscle spasms. 30 tablet 2   VITAMIN D, CHOLECALCIFEROL, PO Take 5,000 Units by mouth 2 (two) times daily.      No current facility-administered medications for this visit.    Medication Side Effects: None  Allergies:  Allergies  Allergen Reactions   Codeine Anaphylaxis   Erythromycin Hives   Tetracyclines & Related Hives   Darvon [Propoxyphene] Photosensitivity    Blurry vision for 3 days   Morphine And Related Dermatitis    "burning in veins"   Other Other (See Comments)    Tomatoes upset stomach, itchy     Penicillins Hives    Has patient had a PCN reaction causing immediate rash, facial/tongue/throat swelling, SOB or lightheadedness with hypotension: Yes Has patient had a PCN reaction causing severe rash involving mucus membranes or skin necrosis: No Has patient had a PCN reaction that required hospitalization No Has patient had a PCN reaction occurring within the last 10 years: No If all of the above answers are "NO", then may proceed with Cephalosporin use.    Sulfa Antibiotics Nausea And Vomiting    Past Medical History:  Diagnosis Date   Arthritis    Asthma    Depression    DJD (degenerative joint disease), lumbar    Fructose intolerance    GERD (gastroesophageal reflux disease)    H/O hiatal hernia    Hard of hearing    bilat    Headache    migraines   Heart murmur    echo done 80's neg   History of bronchitis    History of frequent urinary tract infections    History of vertigo    Hyperlipidemia    Hypertension    currently taking no medications    Obesity    Pinched nerve    in back   PONV (postoperative nausea and  vomiting)    patch used last time   Pre-diabetes    Restless legs    Seasonal allergies    Sleep apnea    Intolerant to CPAP last sleep study   Tinnitus    Wears glasses     Past Medical History, Surgical history, Social history, and Family history were reviewed and updated as appropriate.   Please see review of systems for further details on the patient's review from today.   Objective:   Physical Exam:  There were no vitals taken for this visit.  Physical Exam Constitutional:      General: She is not in acute distress. Musculoskeletal:        General: No deformity.  Neurological:  Mental Status: She is alert and oriented to person, place, and time.     Coordination: Coordination normal.  Psychiatric:        Attention and Perception: Attention and perception normal. She does not perceive auditory or visual hallucinations.        Mood and Affect: Mood normal. Mood is not anxious or depressed. Affect is not labile, blunt, angry or inappropriate.        Speech: Speech normal.        Behavior: Behavior normal.        Thought Content: Thought content normal. Thought content is not paranoid or delusional. Thought content does not include homicidal or suicidal ideation. Thought content does not include homicidal or suicidal plan.        Cognition and Memory: Cognition and memory normal.        Judgment: Judgment normal.     Comments: Insight intact     Lab Review:     Component Value Date/Time   NA 139 10/25/2020 1510   NA 141 12/07/2015 0000   K 4.4 10/25/2020 1510   CL 102 10/25/2020 1510   CO2 31 10/25/2020 1510   GLUCOSE 93 10/25/2020 1510   BUN 15 10/25/2020 1510   BUN 24 (A) 12/07/2015 0000   CREATININE 0.77 10/25/2020 1510   CREATININE 0.81 07/11/2016 1604   CALCIUM 9.8 10/25/2020 1510   PROT 6.6 10/25/2020 1510   ALBUMIN 4.1 10/25/2020 1510   AST 19 10/25/2020 1510   ALT 14 10/25/2020 1510   ALKPHOS 88 10/25/2020 1510   BILITOT 0.4 10/25/2020 1510    GFRNONAA >60 07/11/2015 0845   GFRAA >60 07/11/2015 0845       Component Value Date/Time   WBC 6.3 10/25/2020 1510   RBC 4.35 10/25/2020 1510   HGB 13.3 10/25/2020 1510   HCT 40.3 10/25/2020 1510   PLT 206.0 10/25/2020 1510   MCV 92.7 10/25/2020 1510   MCH 30.1 07/11/2016 1604   MCHC 33.0 10/25/2020 1510   RDW 13.1 10/25/2020 1510   LYMPHSABS 3.3 10/25/2020 1510   MONOABS 0.4 10/25/2020 1510   EOSABS 0.2 10/25/2020 1510   BASOSABS 0.1 10/25/2020 1510    No results found for: "POCLITH", "LITHIUM"   No results found for: "PHENYTOIN", "PHENOBARB", "VALPROATE", "CBMZ"   .res Assessment: Plan:    Plan:  PDMP reviewed  Prozac '20mg'$  daily.  Add Hydroxyzine '25mg'$  - up to 4 times daily - take one to two at bedtime for sleep - may also take 1/2 tablet during the day for anxiety.  Set up with therapist - Taylor Silva.   RTC 4 weeks  Patient advised to contact office with any questions, adverse effects, or acute worsening in signs and symptoms.  There are no diagnoses linked to this encounter.   Please see After Visit Summary for patient specific instructions.  Future Appointments  Date Time Provider [place]  03/21/2022 10:00 AM Shanon Ace, LCSW CP-CP None  04/03/2022 12:00 PM Shanon Ace, LCSW CP-CP None  04/17/2022 10:00 AM Shanon Ace, LCSW CP-CP None    No orders of the defined types were placed in this encounter.   -------------------------------

## 2022-03-21 ENCOUNTER — Ambulatory Visit: Payer: Medicare Other | Admitting: Psychiatry

## 2022-04-03 ENCOUNTER — Ambulatory Visit (INDEPENDENT_AMBULATORY_CARE_PROVIDER_SITE_OTHER): Payer: Medicare Other | Admitting: Psychiatry

## 2022-04-03 DIAGNOSIS — F331 Major depressive disorder, recurrent, moderate: Secondary | ICD-10-CM | POA: Diagnosis not present

## 2022-04-03 NOTE — Progress Notes (Signed)
[facility] Counselor/Therapist Progress Note  Patient ID: Taylor Silva, MRN: [id number],    Date: 04/03/2022  Time Spent: 50 minutes   Treatment Type: Individual Therapy  Virtual Visit via Telehealth Note: MyChart Video Connected with patient by a telemedicine/telehealth application, with their informed consent, and verified patient privacy and that I am speaking with the correct person using two identifiers. I discussed the limitations, risks, security and privacy concerns of performing psychotherapy and the availability of in person appointments. I also discussed with the patient that there may be a patient responsible charge related to this service. The patient expressed understanding and agreed to proceed. I discussed the treatment planning with the patient. The patient was provided an opportunity to ask questions and all were answered. The patient agreed with the plan and demonstrated an understanding of the instructions. The patient was advised to call  our office if  symptoms worsen or feel they are in a crisis state and need immediate contact.   Therapist Location: office Patient Location: home  Reported Symptoms: irritability, anxiety, depression  Mental Status Exam:  Appearance:   Casual     Behavior:  Sharing  Motor:  Affected by knee pain  Speech/Language:   Clear and Coherent  Affect:  Depressed  Mood:  depressed and some irritability  Thought process:  normal  Thought content:    Rumination  Sensory/Perceptual disturbances:    WNL  Orientation:  oriented to person, place, time/date, situation, day of week, month of year, year, and stated date of April 03, 2022  Attention:  Good  Concentration:  Good  Memory:  WNL  Fund of knowledge:   Good  Insight:    Good  Judgment:   Good  Impulse Control:  Good   Risk Assessment: Danger to Self:  No Self-injurious Behavior: No Danger to Others: No Duty to Warn:no Physical Aggression / Violence:No  Access to  Firearms a concern: No  Gang Involvement:No   Subjective:  Patient today reporting anxiety and depression as her main symptoms for which she is seeking help.  She continues to live with her family which consists of a son who is mentally disabled and receives disability, his wife who is not working, and their 40-year-old autistic daughter.  Patient has been married 3 times, divorced to husband's, and third husband was on dialysis and got septic and died.  Patient shares this information as it is helpful in understanding her challenges and adjusting, coping, and moving forward. Still missing husband who died a few months ago. Things going wrong at home that he would normally take care of. Processing more of her grief today and is making progress in her "mental/emotional" strength. Physical strength is lacking and needing knee replacement but states she has to lose some weight before having that done. Feels she is better with her grief. Few friends and doesn't  really make efforts to connect however has planned to start attending grief support group at her church.  Encouraged her to follow through with that group.  Talked with patient about her needs at this point as she does not feel like she needs frequent counseling but would like to have sessions approximately "6 weeks apart" as she feels that would be helpful.  I agreed to try this with patient and included a couple of "homework assignments" for her to work on in the meantime including practice speaking to people when in her grief group or either at church or other places when she  has the opportunity, doing some journaling, and reaching out to her doctor about the questions she had been asking me earlier in reference to medications for weight loss.  She reports nothing has ever worked for her in that respect but needs to lose weight before she can be approved to have her knee surgery, which her knee is causing her a lot of pain currently.  Did add that she has  "good neighbors but does not have much communication with them.  Interventions: Cognitive Behavioral Therapy and Ego-Supportive  Treatment goals are: 1.  Elevate mood and show evidence of usual energy, activities, and socialization level. 2.  Verbalize an understanding of the relationship between repressed anger and depressed mood. 3.  Assist in developing coping strategies for feelings of depression.  Diagnosis:   ICD-10-CM   1. Major depressive disorder, recurrent episode, moderate ([place])  F33.1      Plan:  Patient today to participate in session but stated that she thought we would be working together about getting something done for her knee as she is needing knee surgery, but has been told she must lose weight first.  I asked patient if she thought talking about strategies for weight loss would be helpful and she said no but she does want to ask her doctor about possible medications that might help her in weight loss.  I agreed with her that that would need to come from her doctor.  She did state that it would be helpful for [place] to talk occasionally and suggested approximately 6 weeks, and that we could try that and see how that works but she felt that would be helpful.  States that she is not a big talker and does not talk to a lot of people.  Denies any SI.  As noted in a previous session she does live at home with a disabled son and an autistic granddaughter and other family members living with them which seems like a challenging environment for patient however she does not choose to talk about that and I did not push on that.  Encouraged patient in her practice of more positive and self affirming behaviors including: Staying in the present and focused on what she can change or control, getting outside some daily for break, staying in touch with supportive people, remain on her prescribed medications, healthy nutrition and some exercise as she is able, practice "the pause", refrain from assuming  negatives and worst-case scenarios, use of positive self talk, reduce overthinking and over analyzing, challenge and counteract her self-doubt, letting go of things including guilt from the past that hold her back now from moving forward, and recognize the strength she shows working with goal-directed behaviors to move in a direction that supports increased self-confidence, improved emotional health, and overall wellbeing.   Goal review and progress/challenges noted with patient.  Next appointment within 6 weeks.  This record has been created using [employer].  Chart creation errors have been sought, but may not always have been located and corrected.  Such creation errors do not reflect on the standard of medical care provided.   Shanon Ace, LCSW

## 2022-04-17 ENCOUNTER — Ambulatory Visit: Payer: Medicare Other | Admitting: Psychiatry

## 2022-04-18 ENCOUNTER — Encounter: Payer: Self-pay | Admitting: Adult Health

## 2022-04-18 ENCOUNTER — Ambulatory Visit (INDEPENDENT_AMBULATORY_CARE_PROVIDER_SITE_OTHER): Payer: Medicare Other | Admitting: Adult Health

## 2022-04-18 DIAGNOSIS — F411 Generalized anxiety disorder: Secondary | ICD-10-CM

## 2022-04-18 DIAGNOSIS — F4321 Adjustment disorder with depressed mood: Secondary | ICD-10-CM | POA: Diagnosis not present

## 2022-04-18 DIAGNOSIS — G4700 Insomnia, unspecified: Secondary | ICD-10-CM | POA: Diagnosis not present

## 2022-04-18 DIAGNOSIS — F331 Major depressive disorder, recurrent, moderate: Secondary | ICD-10-CM

## 2022-04-18 MED ORDER — HYDROXYZINE HCL 25 MG PO TABS: ORAL_TABLET | ORAL | 2 refills | Status: DC

## 2022-04-18 MED ORDER — FLUOXETINE HCL 40 MG PO CAPS: 40.0000 mg | ORAL_CAPSULE | Freq: Every day | ORAL | 5 refills | Status: DC

## 2022-04-18 NOTE — Progress Notes (Signed)
Taylor Silva [id number] 1955-12-20 67 y.o.  Subjective:   Patient ID:  Taylor Silva is a 67 y.o. (DOB [birth year]) female.  Chief Complaint: No chief complaint on file.   HPI Taylor Silva presents to the office today for follow-up of MDD, GAD, insomnia and grief.  Referred by PCP for medication management.  Reports losing her husband and 37 year old dog in late 2023. Has struggled with emotional stability since the losses.   Describes mood today as "a little better". Pleasant. Tearful. Mood symptoms - reports decreased depression and anxiety. Reports increased irritability. Reports worry, rumination, and over thinking. Mood is lower - "not as low". Stating "I feel like the Prozac is helping some". Would like to try and increase the dose. Improved interest and motivation. Taking medications as prescribed.  Energy levels lower. Active, does not have a regular exercise routine - physical limitations.   Enjoys some usual interests and activities. Her son, his wife, and grand-daughter moved in with her to help out. Spending time with family. Appetite adequate. Weight stable. Sleep has improved. Averages 5 to 6 hours. Denies daytime napping.  Focus and concentration stable. Completing tasks. Managing aspects of household. Retired in July 2023 - receives [employer] benefits.  Denies SI or HI.  Denies AH or VH. Ringing in her ear - tinnitus. Denies substance use.  Denies self harm.  Seeing therapist - Rockne Menghini  Previous medication trials: Cymbalta, Lexapro, Prozac, Zoloft, Wellbutrin,  Xanax, Wellbutrin, Trazadone.   GAD-7    Flowsheet Row Office Visit from 12/21/2019 in [facility] at [facility]  Total GAD-7 Score 20      PHQ2-9    Flowsheet Row Office Visit from 01/17/2020 in [facility] at [facility] Office Visit from 12/21/2019 in [facility] at [facility] Nutrition  from 03/17/2019 in [facility] at [facility] Visit from 03/09/2018 in [facility] Care at [facility] Nutrition from 10/17/2014 in [facility] at [facility] Total Score 1 6 0 6 0  PHQ-9 Total Score 10 26 -- 22 --        Review of Systems:  Review of Systems  Musculoskeletal:  Negative for gait problem.  Neurological:  Negative for tremors.  Psychiatric/Behavioral:         Please refer to HPI    Medications: I have reviewed the patient's current medications.  Current Outpatient Medications  Medication Sig Dispense Refill   ACETAMINOPHEN-BUTALBITAL 50-325 MG TABS Take 1 tablet by mouth three times daily as needed 40 tablet 0   Albuterol Sulfate (PROAIR RESPICLICK) 108 (90 Base) MCG/ACT AEPB Inhale 2 puffs into the lungs 4 (four) times daily as needed. 1 each 3   Azelastine HCl 137 MCG/SPRAY SOLN USE 2 SPRAY(S) IN EACH NOSTRIL TWICE DAILY AS DIRECTED 30 mL 0   Calcium Citrate-Vitamin D (CALCIUM CITRATE+D3 PETITES PO) Take 6 tablets by mouth daily.     cetirizine (ZYRTEC) 10 MG tablet Take 10 mg by mouth daily.     FLUoxetine (PROZAC) 20 MG capsule Take 1 capsule (20 mg total) by mouth daily. 30 capsule 2   gabapentin (NEURONTIN) 600 MG tablet TAKE 1 TABLET BY MOUTH THREE TIMES DAILY 90 tablet 0   hydrOXYzine (ATARAX) 25 MG tablet Take one to two tablets during the day and 1 to 2 tablets at bedtime. 120 tablet  2   montelukast (SINGULAIR) 10 MG tablet Take 1 tablet (10 mg total) by mouth at bedtime as needed. 90 tablet 1   Multiple Vitamins-Minerals (MULTIVITAMIN WITH MINERALS) tablet Take 2 tablets by mouth daily.     Omega-3 Fatty Acids (FISH OIL PO) Take 1 capsule by mouth daily.      propranolol (INDERAL) 20 MG tablet Take 1 tablet (20 mg total) by mouth 2 (two) times daily. 60 tablet 5   tiZANidine (ZANAFLEX) 2 MG tablet Take 0.5-2 tablets (1-4 mg total) by mouth at  bedtime as needed for muscle spasms. 30 tablet 2   VITAMIN D, CHOLECALCIFEROL, PO Take 5,000 Units by mouth 2 (two) times daily.      No current facility-administered medications for this visit.    Medication Side Effects: None  Allergies:  Allergies  Allergen Reactions   Codeine Anaphylaxis   Erythromycin Hives   Tetracyclines & Related Hives   Darvon [Propoxyphene] Photosensitivity    Blurry vision for 3 days   Morphine And Related Dermatitis    "burning in veins"   Other Other (See Comments)    Tomatoes upset stomach, itchy     Penicillins Hives    Has patient had a PCN reaction causing immediate rash, facial/tongue/throat swelling, SOB or lightheadedness with hypotension: Yes Has patient had a PCN reaction causing severe rash involving mucus membranes or skin necrosis: No Has patient had a PCN reaction that required hospitalization No Has patient had a PCN reaction occurring within the last 10 years: No If all of the above answers are "NO", then may proceed with Cephalosporin use.    Sulfa Antibiotics Nausea And Vomiting    Past Medical History:  Diagnosis Date   Arthritis    Asthma    Depression    DJD (degenerative joint disease), lumbar    Fructose intolerance    GERD (gastroesophageal reflux disease)    H/O hiatal hernia    Hard of hearing    bilat    Headache    migraines   Heart murmur    echo done 80's neg   History of bronchitis    History of frequent urinary tract infections    History of vertigo    Hyperlipidemia    Hypertension    currently taking no medications    Obesity    Pinched nerve    in back   PONV (postoperative nausea and vomiting)    patch used last time   Pre-diabetes    Restless legs    Seasonal allergies    Sleep apnea    Intolerant to CPAP last sleep study   Tinnitus    Wears glasses     Past Medical History, Surgical history, Social history, and Family history were reviewed and updated as appropriate.   Please see  review of systems for further details on the patient's review from today.   Objective:   Physical Exam:  There were no vitals taken for this visit.  Physical Exam Constitutional:      General: She is not in acute distress. Musculoskeletal:        General: No deformity.  Neurological:     Mental Status: She is alert and oriented to person, place, and time.     Coordination: Coordination normal.  Psychiatric:        Attention and Perception: Attention and perception normal. She does not perceive auditory or visual hallucinations.        Mood and Affect: Mood normal. Mood is  not anxious or depressed. Affect is not labile, blunt, angry or inappropriate.        Speech: Speech normal.        Behavior: Behavior normal.        Thought Content: Thought content normal. Thought content is not paranoid or delusional. Thought content does not include homicidal or suicidal ideation. Thought content does not include homicidal or suicidal plan.        Cognition and Memory: Cognition and memory normal.        Judgment: Judgment normal.     Comments: Insight intact     Lab Review:     Component Value Date/Time   NA 139 10/25/2020 1510   NA 141 12/07/2015 0000   K 4.4 10/25/2020 1510   CL 102 10/25/2020 1510   CO2 31 10/25/2020 1510   GLUCOSE 93 10/25/2020 1510   BUN 15 10/25/2020 1510   BUN 24 (A) 12/07/2015 0000   CREATININE 0.77 10/25/2020 1510   CREATININE 0.81 07/11/2016 1604   CALCIUM 9.8 10/25/2020 1510   PROT 6.6 10/25/2020 1510   ALBUMIN 4.1 10/25/2020 1510   AST 19 10/25/2020 1510   ALT 14 10/25/2020 1510   ALKPHOS 88 10/25/2020 1510   BILITOT 0.4 10/25/2020 1510   GFRNONAA >60 07/11/2015 0845   GFRAA >60 07/11/2015 0845       Component Value Date/Time   WBC 6.3 10/25/2020 1510   RBC 4.35 10/25/2020 1510   HGB 13.3 10/25/2020 1510   HCT 40.3 10/25/2020 1510   PLT 206.0 10/25/2020 1510   MCV 92.7 10/25/2020 1510   MCH 30.1 07/11/2016 1604   MCHC 33.0 10/25/2020 1510    RDW 13.1 10/25/2020 1510   LYMPHSABS 3.3 10/25/2020 1510   MONOABS 0.4 10/25/2020 1510   EOSABS 0.2 10/25/2020 1510   BASOSABS 0.1 10/25/2020 1510    No results found for: "POCLITH", "LITHIUM"   No results found for: "PHENYTOIN", "PHENOBARB", "VALPROATE", "CBMZ"   .res Assessment: Plan:    Plan:  PDMP reviewed  Increase Prozac 20mg  to 40mg  daily.  Continue Hydroxyzine 25mg  - up to 4 times daily   Set up with therapist - Rockne Menghini.   RTC 4 months  Patient advised to contact office with any questions, adverse effects, or acute worsening in signs and symptoms.  There are no diagnoses linked to this encounter.   Please see After Visit Summary for patient specific instructions.  Future Appointments  Date Time Provider Department Center  04/18/2022  1:20 PM Gerry Blanchfield, Thereasa Solo, NP [facility] None  05/19/2022 11:00 AM Mathis Fare, LCSW [facility] None    No orders of the defined types were placed in this encounter.   -------------------------------

## 2022-05-19 ENCOUNTER — Ambulatory Visit: Payer: Medicare Other | Admitting: Psychiatry

## 2022-05-19 NOTE — Progress Notes (Unsigned)
      [facility] Counselor/Therapist Progress Note  Patient ID: Taylor Silva, MRN: [id number],    Date: 05/19/2022  Time Spent: ***   Treatment Type: {CHL AMB THERAPY [phone]}  Reported Symptoms: ***  Mental Status Exam:  Appearance:   {PSY:22683}     Behavior:  {PSY:21022743}  Motor:  {PSY:22302}  Speech/Language:   {PSY:22685}  Affect:  {PSY:22687}  Mood:  {PSY:31886}  Thought process:  {PSY:31888}  Thought content:    {PSY:[phone]}  Sensory/Perceptual disturbances:    {[phone]}  Orientation:  {PSY:30297}  Attention:  {PSY:22877}  Concentration:  {[phone]}  Memory:  {[phone]}  Fund of knowledge:   {[phone]}  Insight:    {[phone]}  Judgment:   {[phone]}  Impulse Control:  {[phone]}   Risk Assessment: Danger to Self:  {PSY:22692} Self-injurious Behavior: {PSY:22692} Danger to Others: {PSY:22692} Duty to Warn:{PSY:311194} Physical Aggression / Violence:{PSY:21197} Access to Firearms a concern: {PSY:21197} Gang Involvement:{PSY:21197}  Subjective: ***  Interventions: {[phone]}  Diagnosis:No diagnosis found.  Plan: ***    Mathis Fare, LCSW

## 2022-07-18 ENCOUNTER — Ambulatory Visit: Payer: Medicare Other | Admitting: Adult Health

## 2022-07-27 ENCOUNTER — Emergency Department (HOSPITAL_BASED_OUTPATIENT_CLINIC_OR_DEPARTMENT_OTHER): Payer: Medicare Other

## 2022-07-27 ENCOUNTER — Encounter (HOSPITAL_BASED_OUTPATIENT_CLINIC_OR_DEPARTMENT_OTHER): Payer: Self-pay | Admitting: Urology

## 2022-07-27 ENCOUNTER — Other Ambulatory Visit: Payer: Self-pay

## 2022-07-27 ENCOUNTER — Emergency Department (HOSPITAL_BASED_OUTPATIENT_CLINIC_OR_DEPARTMENT_OTHER)
Admission: EM | Admit: 2022-07-27 | Discharge: 2022-07-27 | Disposition: A | Discharge status: Home / Self Care | Payer: Medicare Other | Source: Home / Self Care | Attending: Emergency Medicine | Admitting: Emergency Medicine

## 2022-07-27 DIAGNOSIS — M79601 Pain in right arm: Secondary | ICD-10-CM

## 2022-07-27 DIAGNOSIS — Z79899 Other long term (current) drug therapy: Secondary | ICD-10-CM | POA: Diagnosis not present

## 2022-07-27 DIAGNOSIS — M25511 Pain in right shoulder: Secondary | ICD-10-CM | POA: Diagnosis not present

## 2022-07-27 DIAGNOSIS — I10 Essential (primary) hypertension: Secondary | ICD-10-CM | POA: Diagnosis not present

## 2022-07-27 DIAGNOSIS — R739 Hyperglycemia, unspecified: Secondary | ICD-10-CM | POA: Diagnosis not present

## 2022-07-27 LAB — BASIC METABOLIC PANEL
Anion gap: 8 (ref 5–15)
BUN: 15 mg/dL (ref 8–23)
CO2: 27 mmol/L (ref 22–32)
Calcium: 9.1 mg/dL (ref 8.9–10.3)
Chloride: 103 mmol/L (ref 98–111)
Creatinine, Ser: 0.71 mg/dL (ref 0.44–1.00)
GFR, Estimated: >60 mL/min (ref >60)
Glucose, Bld: 141 mg/dL — ABNORMAL HIGH (ref 70–99)
Potassium: 4.1 mmol/L (ref 3.5–5.1)
Sodium: 138 mmol/L (ref 135–145)

## 2022-07-27 LAB — CBC WITH DIFFERENTIAL/PLATELET
Abs Immature Granulocytes: 0.04 10*3/uL (ref 0.00–0.07)
Basophils Absolute: 0 10*3/uL (ref 0.0–0.1)
Basophils Relative: 0 %
Eosinophils Absolute: 0 10*3/uL (ref 0.0–0.5)
Eosinophils Relative: 1 %
HCT: 39.1 % (ref 36.0–46.0)
Hemoglobin: 12.7 g/dL (ref 12.0–15.0)
Immature Granulocytes: 1 %
Lymphocytes Relative: 25 %
Lymphs Abs: 2 10*3/uL (ref 0.7–4.0)
MCH: 29.8 pg (ref 26.0–34.0)
MCHC: 32.5 g/dL (ref 30.0–36.0)
MCV: 91.8 fL (ref 80.0–100.0)
Monocytes Absolute: 0.4 10*3/uL (ref 0.1–1.0)
Monocytes Relative: 5 %
Neutro Abs: 5.5 10*3/uL (ref 1.7–7.7)
Neutrophils Relative %: 68 %
Platelets: 184 10*3/uL (ref 150–400)
RBC: 4.26 MIL/uL (ref 3.87–5.11)
RDW: 13 % (ref 11.5–15.5)
WBC: 8.1 10*3/uL (ref 4.0–10.5)
nRBC: 0 % (ref 0.0–0.2)

## 2022-07-27 MED ORDER — LIDOCAINE 5 % EX PTCH
1.0000 | MEDICATED_PATCH | CUTANEOUS | Status: DC
  Administered 2022-07-27: 1 via TRANSDERMAL
  Filled 2022-07-27: qty 1

## 2022-07-27 MED ORDER — LIDOCAINE 5 % EX PTCH: 1.0000 | MEDICATED_PATCH | CUTANEOUS | 0 refills | Status: DC

## 2022-07-27 MED ORDER — METHOCARBAMOL 500 MG PO TABS: 500.0000 mg | ORAL_TABLET | Freq: Two times a day (BID) | ORAL | 0 refills | Status: DC

## 2022-07-27 MED ORDER — METHOCARBAMOL 500 MG PO TABS
500.0000 mg | ORAL_TABLET | Freq: Once | ORAL | Status: AC
  Administered 2022-07-27: 500 mg via ORAL
  Filled 2022-07-27: qty 1

## 2022-07-27 NOTE — ED Triage Notes (Signed)
Pt states had shingles shot and pneumonia shot on 7/17 States since is having severe right arm pain that has gradually worsening  State pain now radiating from right elbow up to neck  Also reports feels like heart is fluttering

## 2022-07-27 NOTE — Discharge Instructions (Addendum)
You have been seen today for your complaint of arm pain. Your lab work  was reassuring. Your imaging was reassuring. Your discharge medications include robaxin. This is a muscle relaxer. It may cause drowsiness. Do not drive, operate heavy machinery or make important decisions when taking this medication. Only take it at night until you know how it affects you. Only take it as needed and take other medications such as tylenol prior to trying this medication.  Do not take this and tizanidine at the same time. Lidocaine patches.  Apply 1 patch every 12 hours for pain Follow up with: Your PCP in 5 days for reevaluation Please seek immediate medical care if you develop any of the following symptoms: Fevers, chills, chest pain, worsening shortness of breath numbness or difficulty using the right arm At this time there does not appear to be the presence of an emergent medical condition, however there is always the potential for conditions to change. Please read and follow the below instructions.  Do not take your medicine if  develop an itchy rash, swelling in your mouth or lips, or difficulty breathing; call 911 and seek immediate emergency medical attention if this occurs.  You may review your lab tests and imaging results in their entirety on your MyChart account.  Please discuss all results of fully with your primary care provider and other specialist at your follow-up visit.  Note: Portions of this text may have been transcribed using voice recognition software. Every effort was made to ensure accuracy; however, inadvertent computerized transcription errors may still be present.

## 2022-07-27 NOTE — ED Provider Notes (Signed)
Supervised resident visit.  Patient here with right deltoid pain after vaccine shots here few days ago.  History of high cholesterol.  Overall appears well.  Normal vitals.  She is tender around the vaccine shot area.  She had basic labs done that were unremarkable.  X-ray done of the chest that was unremarkable.  Given lidocaine patch and muscle relaxant with improvement.  She is neurovascular neuromuscular intact on exam.  I suspect she is having immune response to vaccines that is probably causing some inflammation locally in her lymph nodes.  I do not think she has a heart attack or any other acute cardiac or pulmonary process or vascular process.  Will give sling for comfort.  Recommend continued use of muscle relaxants lidocaine and follow-up closely with primary care doctor.  Told return if symptoms worsen.  Discharged in good condition.  This chart was dictated using voice recognition software.  Despite best efforts to proofread,  errors can occur which can change the documentation meaning.    Virgina Norfolk, DO 07/27/22 1203

## 2022-07-27 NOTE — ED Provider Notes (Signed)
[facility] Provider Note   CSN: [id number] Arrival date & time: 07/27/22  0935     History  Chief Complaint  Patient presents with   Arm Pain    Taylor Silva is a 67 y.o. female.  With a history of anxiety, depression, sleep apnea, GERD, degenerative disc disease, hypertension who presents to the ED for evaluation of right shoulder pain.  She states she received a shingles and a pneumonia vaccine 4 days ago.  These were each in separate shoulders.  She is not sure which vaccine she received in each shoulder.  She has had progressively worsening pain to the right shoulder.  She states this feels like muscular pain.  It radiates down to the right elbow and into the right trapezius.  She denies any numbness, weakness or tingling.  She has been taking Tylenol, tizanidine, gabapentin, topical lidocaine for the pain with minimal relief.  She states that it is worse when she moves the arm.  She denies any fevers or chills.  No chest pain but states she feels like her heart is fluttering.  She feels mildly short of breath as well.  She had some nausea last night secondary to the pain.  No vomiting.  She denies any loss of grip strength.   Arm Pain       Home Medications Prior to Admission medications   Medication Sig Start Date End Date Taking? Authorizing Provider  lidocaine (LIDODERM) 5 % Place 1 patch onto the skin daily. Remove & Discard patch within 12 hours or as directed by MD 07/27/22  Yes Dayson Aboud, Edsel Petrin, PA-C  methocarbamol (ROBAXIN) 500 MG tablet Take 1 tablet (500 mg total) by mouth 2 (two) times daily. 07/27/22  Yes Deazia Lampi, Edsel Petrin, PA-C  ACETAMINOPHEN-BUTALBITAL 50-325 MG TABS Take 1 tablet by mouth three times daily as needed 10/01/20   Bradd Canary, MD  Albuterol Sulfate (PROAIR RESPICLICK) 108 (90 Base) MCG/ACT AEPB Inhale 2 puffs into the lungs 4 (four) times daily as needed. 04/12/18   Wanda Plump, MD  Azelastine HCl  137 MCG/SPRAY SOLN USE 2 SPRAY(S) IN EACH NOSTRIL TWICE DAILY AS DIRECTED 04/14/19   Bradd Canary, MD  Calcium Citrate-Vitamin D (CALCIUM CITRATE+D3 PETITES PO) Take 6 tablets by mouth daily.    [provider]  cetirizine (ZYRTEC) 10 MG tablet Take 10 mg by mouth daily.    [provider]  FLUoxetine (PROZAC) 40 MG capsule Take 1 capsule (40 mg total) by mouth daily. 04/18/22   Mozingo, Thereasa Solo, NP  gabapentin (NEURONTIN) 600 MG tablet TAKE 1 TABLET BY MOUTH THREE TIMES DAILY 04/22/21   Bradd Canary, MD  hydrOXYzine (ATARAX) 25 MG tablet Take one to two tablets during the day and 1 to 2 tablets at bedtime. 04/18/22   Mozingo, Thereasa Solo, NP  montelukast (SINGULAIR) 10 MG tablet Take 1 tablet (10 mg total) by mouth at bedtime as needed. 07/23/20   Bradd Canary, MD  Multiple Vitamins-Minerals (MULTIVITAMIN WITH MINERALS) tablet Take 2 tablets by mouth daily.    [provider]  Omega-3 Fatty Acids (FISH OIL PO) Take 1 capsule by mouth daily.     [provider]  propranolol (INDERAL) 20 MG tablet Take 1 tablet (20 mg total) by mouth 2 (two) times daily. 10/25/20   Bradd Canary, MD  tiZANidine (ZANAFLEX) 2 MG tablet Take 0.5-2 tablets (1-4 mg total) by mouth at bedtime as needed for  muscle spasms. 10/25/20   Bradd Canary, MD  VITAMIN D, CHOLECALCIFEROL, PO Take 5,000 Units by mouth 2 (two) times daily.     [provider]      Allergies    Codeine, Erythromycin, Tetracyclines & related, Darvon [propoxyphene], Morphine and codeine, Other, Penicillins, and Sulfa antibiotics    Review of Systems   Review of Systems  Musculoskeletal:  Positive for myalgias.  All other systems reviewed and are negative.   Physical Exam Updated Vital Signs BP (!) 140/80 (BP Location: Right Arm)   Pulse 83   Temp 98.8 F (37.1 C) (Oral)   Resp 18   Ht 5\' 6"  (1.676 m)   Wt 114.3 kg   SpO2 98%   BMI 40.67 kg/m  Physical Exam Vitals and  nursing note reviewed.  Constitutional:      General: She is not in acute distress.    Appearance: She is well-developed.  HENT:     Head: Normocephalic and atraumatic.  Eyes:     Conjunctiva/sclera: Conjunctivae normal.  Cardiovascular:     Rate and Rhythm: Normal rate and regular rhythm.     Heart sounds: No murmur heard. Pulmonary:     Effort: Pulmonary effort is normal. No respiratory distress.     Breath sounds: Normal breath sounds. No wheezing, rhonchi or rales.  Abdominal:     Palpations: Abdomen is soft.     Tenderness: There is no abdominal tenderness.  Musculoskeletal:        General: No swelling.     Cervical back: Neck supple.  Skin:    General: Skin is warm and dry.     Capillary Refill: Capillary refill takes less than 2 seconds.  Neurological:     Mental Status: She is alert.  Psychiatric:        Mood and Affect: Mood normal.     ED Results / Procedures / Treatments   Labs (all labs ordered are listed, but only abnormal results are displayed) Labs Reviewed  BASIC METABOLIC PANEL - Abnormal; Notable for the following components:      Result Value   Glucose, Bld 141 (*)    All other components within normal limits  CBC WITH DIFFERENTIAL/PLATELET    EKG EKG Interpretation Date/Time:  Sunday July 27 2022 09:49:23 EDT Ventricular Rate:  84 PR Interval:  153 QRS Duration:  88 QT Interval:  377 QTC Calculation: 446 R Axis:   51  Text Interpretation: Sinus rhythm Confirmed by Virgina Norfolk [phone]) on 07/27/2022 11:52:32 AM  Radiology DG Chest Port 1 View  Result Date: 07/27/2022 CLINICAL DATA:  Shortness of breath. EXAM: PORTABLE CHEST 1 VIEW COMPARISON:  12/30/2016 FINDINGS: The lungs are clear without focal pneumonia, edema, pneumothorax or pleural effusion. Cardiopericardial silhouette is at upper limits of normal for size. No acute bony abnormality. Telemetry leads overlie the chest. IMPRESSION: No active disease. Electronically Signed   By: Kennith Center M.D.   On: 07/27/2022 10:14    Procedures Procedures    Medications Ordered in ED Medications  lidocaine (LIDODERM) 5 % 1 patch (1 patch Transdermal Patch Applied 07/27/22 1004)  methocarbamol (ROBAXIN) tablet 500 mg (500 mg Oral Given 07/27/22 1004)    ED Course/ Medical Decision Making/ A&P                             Medical Decision Making Amount and/or Complexity of Data Reviewed Labs: ordered. Radiology: ordered.  Risk  Prescription drug management.  This patient presents to the ED for concern of right shoulder pain after a vaccine, this involves an extensive number of treatment options, and is a complaint that carries with it a high risk of complications and morbidity.  The differential diagnosis includes localized vaccine reaction, cellulitis, DVT of the upper extremity  Co morbidities that complicate the patient evaluation  anxiety, depression, sleep apnea, GERD, degenerative disc disease, hypertension  My initial workup includes labs, imaging, EKG  Additional history obtained from: Nursing notes from this visit. Previous records within EMR system office visit on 07/23/2022 for vaccinations  I ordered, reviewed and interpreted labs which include: BMP, CBC.  No leukocytosis or anemia.  Electrolytes within normal limits.  Kidney function normal.  Hyperglycemia of 141.  I ordered imaging studies including chest x-ray I independently visualized and interpreted imaging which showed negative I agree with the radiologist interpretation  Cardiac Monitoring:  The patient was maintained on a cardiac monitor.  I personally viewed and interpreted the cardiac monitored which showed an underlying rhythm of: NSR  Afebrile, hypertensive but otherwise hemodynamically stable.  67 year old female presenting to the ED for evaluation of right shoulder pain.  This radiates down to the right elbow.  Pain is localized to the lateral aspect of the upper extremity.  Also radiates to  the right trapezius.  She had a shingles and pneumonia vaccine series 4 days ago.  Pain has been present since that time.  On exam, she has decreased into motion secondary to pain.  Her neurovascular status is otherwise intact.  There is a small area of erythema to the right medial deltoid consistent with an injection site.  Does not appear infected at this time.  Overall I do suspect a vaccine reaction as the cause of her symptoms.  She reported improvement with Robaxin and Lidoderm in the ED.  These prescriptions will be sent to her pharmacy.  She was encouraged to stop taking the tizanidine.  She was given information regarding side effects of muscle relaxers.  I have low suspicion for ACS, DVT or other acute emergent cardiopulmonary abnormalities as the cause of her symptoms.  She is encouraged to follow-up with her primary care provider in 3 to 5 days.  She was also encouraged to alternate heat and ice for her symptoms.  She was given return precautions.  Stable at discharge.  At this time there does not appear to be any evidence of an acute emergency medical condition and the patient appears stable for discharge with appropriate outpatient follow up. Diagnosis was discussed with patient who verbalizes understanding of care plan and is agreeable to discharge. I have discussed return precautions with patient who verbalizes understanding. Patient encouraged to follow-up with their PCP within 5 days. All questions answered.  Patient's case discussed with Dr. Lockie Mola who agrees with plan to discharge with follow-up.   Note: Portions of this report may have been transcribed using voice recognition software. Every effort was made to ensure accuracy; however, inadvertent computerized transcription errors may still be present.        Final Clinical Impression(s) / ED Diagnoses Final diagnoses:  Right arm pain    Rx / DC Orders ED Discharge Orders          Ordered    methocarbamol (ROBAXIN) 500  MG tablet  2 times daily        07/27/22 1210    lidocaine (LIDODERM) 5 %  Every 24 hours  07/27/22 1210              Michelle Piper, PA-C 07/27/22 1213    Curatolo, Adam, DO 07/27/22 1310

## 2022-07-27 NOTE — ED Notes (Signed)
ED Provider at bedside. 

## 2022-10-19 ENCOUNTER — Other Ambulatory Visit: Payer: Self-pay | Admitting: Adult Health

## 2022-10-19 DIAGNOSIS — F331 Major depressive disorder, recurrent, moderate: Secondary | ICD-10-CM

## 2022-10-19 DIAGNOSIS — F411 Generalized anxiety disorder: Secondary | ICD-10-CM

## 2022-10-29 ENCOUNTER — Other Ambulatory Visit: Payer: Self-pay | Admitting: Adult Health

## 2022-10-29 DIAGNOSIS — F411 Generalized anxiety disorder: Secondary | ICD-10-CM

## 2022-10-29 DIAGNOSIS — F331 Major depressive disorder, recurrent, moderate: Secondary | ICD-10-CM

## 2023-08-06 ENCOUNTER — Other Ambulatory Visit: Payer: Self-pay | Admitting: Adult Health

## 2023-08-06 DIAGNOSIS — F411 Generalized anxiety disorder: Secondary | ICD-10-CM

## 2023-09-01 NOTE — Patient Instructions (Signed)
 SURGICAL WAITING ROOM VISITATION  Patients having surgery or a procedure may have no more than 2 support people in the waiting area - these visitors may rotate.    Children under the age of 8 must have an adult with them who is not the patient.  Visitors with respiratory illnesses are discouraged from visiting and should remain at home.  If the patient needs to stay at the hospital during part of their recovery, the visitor guidelines for inpatient rooms apply. Pre-op nurse will coordinate an appropriate time for 1 support person to accompany patient in pre-op.  This support person may not rotate.    Please refer to the [facility] website for the visitor guidelines for Inpatients (after your surgery is over and you are in a regular room).       Your procedure is scheduled on: 09/15/23   Report to [facility] Main Entrance    Report to admitting at 11  AM   Call this number if you have problems the morning of surgery [phone]   Do not eat food :After Midnight.   After Midnight you may have the following liquids until 10:30 AM DAY OF SURGERY  Water Non-Citrus Juices (without pulp, NO RED-Apple, White grape, White cranberry) Black Coffee (NO MILK/CREAM OR CREAMERS, sugar ok)  Clear Tea (NO MILK/CREAM OR CREAMERS, sugar ok) regular and decaf                             Plain Jell-O (NO RED)                                           Fruit ices (not with fruit pulp, NO RED)                                     Popsicles (NO RED)                                                               Sports drinks like Gatorade (NO RED)                  The day of surgery:  Drink ONE (1) Pre-Surgery Clear Ensure  at 10:30 AM the morning of surgery. Drink in one sitting. Do not sip.  This drink was given to you during your hospital  pre-op appointment visit. Nothing else to drink after completing the  Pre-Surgery Clear Ensure.    Oral Hygiene is also important to reduce your  risk of infection.                                    Remember - BRUSH YOUR TEETH THE MORNING OF SURGERY WITH YOUR REGULAR TOOTHPASTE  DENTURES WILL BE REMOVED PRIOR TO SURGERY PLEASE DO NOT APPLY Poly grip OR ADHESIVES!!!   Stop all vitamins and herbal supplements 7 days before surgery.   Take these medicines the morning of surgery with A SIP OF WATER: fluoxetine (prozac ), gabapentin , rosuvastatin,  inhaler  DO NOT TAKE ANY ORAL DIABETIC MEDICATIONS DAY OF YOUR SURGERY  Bring CPAP mask and tubing day of surgery.                              You may not have any metal on your body including hair pins, jewelry, and body piercing             Do not wear make-up, lotions, powders, perfumes/cologne, or deodorant  Do not wear nail polish including gel and S&S, artificial/acrylic nails, or any other type of covering on natural nails including finger and toenails. If you have artificial nails, gel coating, etc. that needs to be removed by a nail salon please have this removed prior to surgery or surgery may need to be canceled/ delayed if the surgeon/ anesthesia feels like they are unable to be safely monitored.   Do not shave  48 hours prior to surgery.    Do not bring valuables to the hospital.  IS NOT             RESPONSIBLE   FOR VALUABLES.   Contacts, glasses, dentures or bridgework may not be worn into surgery.   Bring small overnight bag day of surgery.   DO NOT BRING YOUR HOME MEDICATIONS TO THE HOSPITAL. PHARMACY WILL DISPENSE MEDICATIONS LISTED ON YOUR MEDICATION LIST TO YOU DURING YOUR ADMISSION IN THE HOSPITAL!    Patients discharged on the day of surgery will not be allowed to drive home.  Someone NEEDS to stay with you for the first 24 hours after anesthesia.   Special Instructions: Bring a copy of your healthcare power of attorney and living will documents the day of surgery if you haven't scanned them before.              Please read over the following fact  sheets you were given: IF YOU HAVE QUESTIONS ABOUT YOUR PRE-OP INSTRUCTIONS PLEASE CALL [phone] [place]    If you received a COVID test during your pre-op visit  it is requested that you wear a mask when out in public, stay away from anyone that may not be feeling well and notify your surgeon if you develop symptoms. If you test positive for Covid or have been in contact with anyone that has tested positive in the last 10 days please notify you surgeon.      Pre-operative 5 CHG Bath Instructions   You can play a key role in reducing the risk of infection after surgery. Your skin needs to be as free of germs as possible. You can reduce the number of germs on your skin by washing with CHG (chlorhexidine  gluconate) soap before surgery. CHG is an antiseptic soap that kills germs and continues to kill germs even after washing.   DO NOT use if you have an allergy to chlorhexidine /CHG or antibacterial soaps. If your skin becomes reddened or irritated, stop using the CHG and notify one of our RNs at [phone].   Please shower with the CHG soap starting 4 days before surgery using the following schedule:     Please keep in mind the following:  DO NOT shave, including legs and underarms, starting the day of your first shower.   You may shave your face at any point before/day of surgery.  Place clean sheets on your bed the day you start using CHG soap. Use a clean washcloth (not used since being washed) for each  shower. DO NOT sleep with pets once you start using the CHG.   CHG Shower Instructions:  If you choose to wash your hair and private area, wash first with your normal shampoo/soap.  After you use shampoo/soap, rinse your hair and body thoroughly to remove shampoo/soap residue.  Turn the water OFF and apply about 3 tablespoons (45 ml) of CHG soap to a CLEAN washcloth.  Apply CHG soap ONLY FROM YOUR NECK DOWN TO YOUR TOES (washing for 3-5 minutes)  DO NOT use CHG soap on face, private  areas, open wounds, or sores.  Pay special attention to the area where your surgery is being performed.  If you are having back surgery, having someone wash your back for you may be helpful. Wait 2 minutes after CHG soap is applied, then you may rinse off the CHG soap.  Pat dry with a clean towel  Put on clean clothes/pajamas   If you choose to wear lotion, please use ONLY the CHG-compatible lotions on the back of this paper.     Additional instructions for the day of surgery: DO NOT APPLY any lotions, deodorants, cologne, or perfumes.   Put on clean/comfortable clothes.  Brush your teeth.  Ask your nurse before applying any prescription medications to the skin.      CHG Compatible Lotions   Aveeno Moisturizing lotion  Cetaphil Moisturizing Cream  Cetaphil Moisturizing Lotion  Clairol Herbal Essence Moisturizing Lotion, Dry Skin  Clairol Herbal Essence Moisturizing Lotion, Extra Dry Skin  Clairol Herbal Essence Moisturizing Lotion, Normal Skin  Curel Age Defying Therapeutic Moisturizing Lotion with Alpha Hydroxy  Curel Extreme Care Body Lotion  Curel Soothing Hands Moisturizing Hand Lotion  Curel Therapeutic Moisturizing Cream, Fragrance-Free  Curel Therapeutic Moisturizing Lotion, Fragrance-Free  Curel Therapeutic Moisturizing Lotion, Original Formula  Eucerin Daily Replenishing Lotion  Eucerin Dry Skin Therapy Plus Alpha Hydroxy Crme  Eucerin Dry Skin Therapy Plus Alpha Hydroxy Lotion  Eucerin Original Crme  Eucerin Original Lotion  Eucerin Plus Crme Eucerin Plus Lotion  Eucerin TriLipid Replenishing Lotion  Keri Anti-Bacterial Hand Lotion  Keri Deep Conditioning Original Lotion Dry Skin Formula Softly Scented  Keri Deep Conditioning Original Lotion, Fragrance Free Sensitive Skin Formula  Keri Lotion Fast Absorbing Fragrance Free Sensitive Skin Formula  Keri Lotion Fast Absorbing Softly Scented Dry Skin Formula  Keri Original Lotion  Keri Skin Renewal Lotion Keri  Silky Smooth Lotion  Keri Silky Smooth Sensitive Skin Lotion  Nivea Body Creamy Conditioning Oil  Nivea Body Extra Enriched Lotion  Nivea Body Original Lotion  Nivea Body Sheer Moisturizing Lotion Nivea Crme  Nivea Skin Firming Lotion  NutraDerm 30 Skin Lotion  NutraDerm Skin Lotion  NutraDerm Therapeutic Skin Cream  NutraDerm Therapeutic Skin Lotion  ProShield Protective Hand Cream   Incentive Spirometer   An incentive spirometer is a tool that can help keep your lungs clear and active. This tool measures how well you are filling your lungs with each breath. Taking long deep breaths may help reverse or decrease the chance of developing breathing (pulmonary) problems (especially infection) following: A long period of time when you are unable to move or be active. BEFORE THE PROCEDURE  If the spirometer includes an indicator to show your best effort, your nurse or respiratory therapist will set it to a desired goal. If possible, sit up straight or lean slightly forward. Try not to slouch. Hold the incentive spirometer in an upright position. INSTRUCTIONS FOR USE  Sit on the edge of your bed if possible,  or sit up as far as you can in bed or on a chair. Hold the incentive spirometer in an upright position. Breathe out normally. Place the mouthpiece in your mouth and seal your lips tightly around it. Breathe in slowly and as deeply as possible, raising the piston or the ball toward the top of the column. Hold your breath for 3-5 seconds or for as long as possible. Allow the piston or ball to fall to the bottom of the column. Remove the mouthpiece from your mouth and breathe out normally. Rest for a few seconds and repeat Steps 1 through 7 at least 10 times every 1-2 hours when you are awake. Take your time and take a few normal breaths between deep breaths. The spirometer may include an indicator to show your best effort. Use the indicator as a goal to work toward during each  repetition. After each set of 10 deep breaths, practice coughing to be sure your lungs are clear. If you have an incision (the cut made at the time of surgery), support your incision when coughing by placing a pillow or rolled up towels firmly against it. Once you are able to get out of bed, walk around indoors and cough well. You may stop using the incentive spirometer when instructed by your caregiver.  RISKS AND COMPLICATIONS Take your time so you do not get dizzy or light-headed. If you are in pain, you may need to take or ask for pain medication before doing incentive spirometry. It is harder to take a deep breath if you are having pain. AFTER USE Rest and breathe slowly and easily. It can be helpful to keep track of a log of your progress. Your caregiver can provide you with a simple table to help with this. If you are using the spirometer at home, follow these instructions: SEEK MEDICAL CARE IF:  You are having difficultly using the spirometer. You have trouble using the spirometer as often as instructed. Your pain medication is not giving enough relief while using the spirometer. You develop fever of 100.5 F (38.1 C) or higher. SEEK IMMEDIATE MEDICAL CARE IF:  You cough up bloody sputum that had not been present before. You develop fever of 102 F (38.9 C) or greater. You develop worsening pain at or near the incision site. MAKE SURE YOU:  Understand these instructions. Will watch your condition. Will get help right away if you are not doing well or get worse. Document Released: 05/05/2006 Document Revised: 03/17/2011 Document Reviewed: 07/06/2006 [facility] Patient Information 2014 [place], [place].

## 2023-09-01 NOTE — Progress Notes (Signed)
 COVID Vaccine received:  []  No [x]  Yes Date of any COVID positive Test in last 90 days: no PCP -  Dr. Artist Cohens @ [facility] -   Chest x-ray -  EKG -   Stress Test - 06/01/14 Epic ECHO - 05/31/14 Epic Cardiac Cath -   Bowel Prep - [x]  No  []   Yes ______  Pacemaker / ICD device [x]  No []  Yes   Spinal Cord Stimulator:[x]  No []  Yes       History of Sleep Apnea? []  No [x]  Yes   CPAP used?- [x]  No []  Yes    Does the patient monitor blood sugar?          [x]  No []  Yes  []  N/A  Patient has: [x]  NO Hx DM   []  Pre-DM                 []  DM1  []   DM2 Does patient have a [employer] or Dexacom? []  No []  Yes   Fasting Blood Sugar Ranges-  Checks Blood Sugar _____ times a day  GLP1 agonist / usual dose - no GLP1 instructions:  SGLT-2 inhibitors / usual dose -no  SGLT-2 instructions:   Blood Thinner / Instructions:no Aspirin Instructions:no  Comments:   Activity level: Patient is able  to climb a flight of stairs without difficulty; [x]  No CP  [x]  No SOB,  Patient can perform ADLs without assistance.   Anesthesia review:   Patient denies shortness of breath, fever, cough and chest pain at PAT appointment.  Patient verbalized understanding and agreement to the Pre-Surgical Instructions that were given to them at this PAT appointment. Patient was also educated of the need to review these PAT instructions again prior to his/her surgery.I reviewed the appropriate phone numbers to call if they have any and questions or concerns.

## 2023-09-03 ENCOUNTER — Other Ambulatory Visit: Payer: Self-pay

## 2023-09-03 ENCOUNTER — Encounter (HOSPITAL_COMMUNITY): Payer: Self-pay

## 2023-09-03 ENCOUNTER — Encounter (HOSPITAL_COMMUNITY)
Admission: RE | Admit: 2023-09-03 | Discharge: 2023-09-03 | Disposition: A | Discharge status: Home / Self Care | Source: Ambulatory Visit | Attending: Orthopedic Surgery | Admitting: Orthopedic Surgery

## 2023-09-03 VITALS — BP 116/94 | HR 82 | Temp 98.1°F | Resp 18 | Ht 66.0 in | Wt 238.0 lb

## 2023-09-03 DIAGNOSIS — Z01818 Encounter for other preprocedural examination: Secondary | ICD-10-CM | POA: Diagnosis present

## 2023-09-03 DIAGNOSIS — I10 Essential (primary) hypertension: Secondary | ICD-10-CM | POA: Insufficient documentation

## 2023-09-03 HISTORY — DX: Anxiety disorder, unspecified: F41.9

## 2023-09-03 LAB — BASIC METABOLIC PANEL WITH GFR
Anion gap: 12 (ref 5–15)
BUN: 16 mg/dL (ref 8–23)
CO2: 26 mmol/L (ref 22–32)
Calcium: 9.6 mg/dL (ref 8.9–10.3)
Chloride: 103 mmol/L (ref 98–111)
Creatinine, Ser: 0.81 mg/dL (ref 0.44–1.00)
GFR, Estimated: >60 mL/min (ref >60)
Glucose, Bld: 109 mg/dL — ABNORMAL HIGH (ref 70–99)
Potassium: 4.1 mmol/L (ref 3.5–5.1)
Sodium: 141 mmol/L (ref 135–145)

## 2023-09-03 LAB — SURGICAL PCR SCREEN
MRSA, PCR: NEGATIVE
Staphylococcus aureus: NEGATIVE

## 2023-09-03 LAB — CBC
HCT: 45.6 % (ref 36.0–46.0)
Hemoglobin: 14.6 g/dL (ref 12.0–15.0)
MCH: 30.2 pg (ref 26.0–34.0)
MCHC: 32 g/dL (ref 30.0–36.0)
MCV: 94.4 fL (ref 80.0–100.0)
Platelets: 205 K/uL (ref 150–400)
RBC: 4.83 MIL/uL (ref 3.87–5.11)
RDW: 12.5 % (ref 11.5–15.5)
WBC: 5.7 K/uL (ref 4.0–10.5)
nRBC: 0 % (ref 0.0–0.2)

## 2023-09-15 ENCOUNTER — Encounter (HOSPITAL_COMMUNITY)
Admission: RE | Disposition: A | Discharge status: Home / Self Care | Payer: Self-pay | Source: Ambulatory Visit | Attending: Orthopedic Surgery

## 2023-09-15 ENCOUNTER — Observation Stay (HOSPITAL_COMMUNITY)
Admission: RE | Admit: 2023-09-15 | Discharge: 2023-09-16 | Disposition: A | Discharge status: Home / Self Care | Source: Ambulatory Visit | Attending: Orthopedic Surgery | Admitting: Orthopedic Surgery

## 2023-09-15 ENCOUNTER — Other Ambulatory Visit: Payer: Self-pay

## 2023-09-15 ENCOUNTER — Encounter (HOSPITAL_COMMUNITY): Payer: Self-pay | Admitting: Orthopedic Surgery

## 2023-09-15 ENCOUNTER — Ambulatory Visit (HOSPITAL_BASED_OUTPATIENT_CLINIC_OR_DEPARTMENT_OTHER): Admitting: Anesthesiology

## 2023-09-15 ENCOUNTER — Ambulatory Visit (HOSPITAL_COMMUNITY): Payer: Self-pay | Admitting: Physician Assistant

## 2023-09-15 DIAGNOSIS — J4520 Mild intermittent asthma, uncomplicated: Secondary | ICD-10-CM | POA: Insufficient documentation

## 2023-09-15 DIAGNOSIS — M25561 Pain in right knee: Secondary | ICD-10-CM | POA: Diagnosis present

## 2023-09-15 DIAGNOSIS — M1711 Unilateral primary osteoarthritis, right knee: Secondary | ICD-10-CM

## 2023-09-15 DIAGNOSIS — Z96651 Presence of right artificial knee joint: Principal | ICD-10-CM

## 2023-09-15 DIAGNOSIS — I10 Essential (primary) hypertension: Secondary | ICD-10-CM | POA: Diagnosis not present

## 2023-09-15 DIAGNOSIS — Z79899 Other long term (current) drug therapy: Secondary | ICD-10-CM | POA: Insufficient documentation

## 2023-09-15 DIAGNOSIS — Z7982 Long term (current) use of aspirin: Secondary | ICD-10-CM | POA: Insufficient documentation

## 2023-09-15 HISTORY — PX: TOTAL KNEE ARTHROPLASTY: SHX125

## 2023-09-15 SURGERY — ARTHROPLASTY, KNEE, TOTAL
Anesthesia: Spinal | Site: Knee | Laterality: Right

## 2023-09-15 MED ORDER — FENTANYL CITRATE PF 50 MCG/ML IJ SOSY
100.0000 ug | PREFILLED_SYRINGE | INTRAMUSCULAR | Status: DC
  Administered 2023-09-15: 50 ug via INTRAVENOUS
  Filled 2023-09-15: qty 2

## 2023-09-15 MED ORDER — ROSUVASTATIN CALCIUM 20 MG PO TABS
40.0000 mg | ORAL_TABLET | Freq: Every day | ORAL | Status: DC
Start: 2023-09-16 — End: 2023-09-16
  Administered 2023-09-16: 40 mg via ORAL
  Filled 2023-09-15: qty 2

## 2023-09-15 MED ORDER — TRANEXAMIC ACID-NACL 1000-0.7 MG/100ML-% IV SOLN
1000.0000 mg | INTRAVENOUS | Status: AC
  Administered 2023-09-15: 1000 mg via INTRAVENOUS
  Filled 2023-09-15: qty 100

## 2023-09-15 MED ORDER — MENTHOL 3 MG MT LOZG: 1.0000 | LOZENGE | OROMUCOSAL | Status: DC | PRN

## 2023-09-15 MED ORDER — GABAPENTIN 300 MG PO CAPS
600.0000 mg | ORAL_CAPSULE | Freq: Three times a day (TID) | ORAL | Status: DC
  Administered 2023-09-15: 600 mg via ORAL
  Filled 2023-09-15 (×2): qty 2

## 2023-09-15 MED ORDER — DEXAMETHASONE SODIUM PHOSPHATE 10 MG/ML IJ SOLN
8.0000 mg | Freq: Once | INTRAMUSCULAR | Status: AC
  Administered 2023-09-15: 8 mg via INTRAVENOUS

## 2023-09-15 MED ORDER — SODIUM CHLORIDE 0.9 % IR SOLN
Status: DC | PRN
  Administered 2023-09-15: 1000 mL

## 2023-09-15 MED ORDER — CHLORHEXIDINE GLUCONATE 0.12 % MT SOLN
15.0000 mL | Freq: Once | OROMUCOSAL | Status: AC
  Administered 2023-09-15: 15 mL via OROMUCOSAL

## 2023-09-15 MED ORDER — TRAMADOL HCL 50 MG PO TABS
50.0000 mg | ORAL_TABLET | Freq: Four times a day (QID) | ORAL | Status: DC | PRN
  Filled 2023-09-15 (×2): qty 1

## 2023-09-15 MED ORDER — PROPOFOL 500 MG/50ML IV EMUL
INTRAVENOUS | Status: AC
  Filled 2023-09-15: qty 50

## 2023-09-15 MED ORDER — ALUM & MAG HYDROXIDE-SIMETH 200-200-20 MG/5ML PO SUSP: 30.0000 mL | ORAL | Status: DC | PRN

## 2023-09-15 MED ORDER — DEXAMETHASONE SODIUM PHOSPHATE 10 MG/ML IJ SOLN
INTRAMUSCULAR | Status: AC
  Filled 2023-09-15: qty 1

## 2023-09-15 MED ORDER — PROPOFOL 500 MG/50ML IV EMUL
INTRAVENOUS | Status: DC | PRN
  Administered 2023-09-15: 125 ug/kg/min via INTRAVENOUS
  Administered 2023-09-15: 100 mg via INTRAVENOUS

## 2023-09-15 MED ORDER — HYDROMORPHONE HCL 1 MG/ML IJ SOLN: 0.5000 mg | INTRAMUSCULAR | Status: DC | PRN

## 2023-09-15 MED ORDER — DIPHENHYDRAMINE HCL 12.5 MG/5ML PO ELIX: 12.5000 mg | ORAL_SOLUTION | ORAL | Status: DC | PRN

## 2023-09-15 MED ORDER — ONDANSETRON HCL 4 MG/2ML IJ SOLN: 4.0000 mg | Freq: Four times a day (QID) | INTRAMUSCULAR | Status: DC | PRN

## 2023-09-15 MED ORDER — SENNA 8.6 MG PO TABS
2.0000 | ORAL_TABLET | Freq: Every day | ORAL | Status: DC
  Administered 2023-09-15: 17.2 mg via ORAL
  Filled 2023-09-15 (×2): qty 2

## 2023-09-15 MED ORDER — OXYCODONE HCL 5 MG PO TABS: 5.0000 mg | ORAL_TABLET | ORAL | Status: DC | PRN

## 2023-09-15 MED ORDER — 0.9 % SODIUM CHLORIDE (POUR BTL) OPTIME
TOPICAL | Status: DC | PRN
  Administered 2023-09-15: 1000 mL

## 2023-09-15 MED ORDER — CEFAZOLIN SODIUM-DEXTROSE 2-4 GM/100ML-% IV SOLN
2.0000 g | INTRAVENOUS | Status: AC
  Administered 2023-09-15: 2 g via INTRAVENOUS
  Filled 2023-09-15: qty 100

## 2023-09-15 MED ORDER — FLUOXETINE HCL 20 MG PO CAPS
40.0000 mg | ORAL_CAPSULE | Freq: Every day | ORAL | Status: DC
  Administered 2023-09-16: 40 mg via ORAL
  Filled 2023-09-15: qty 2

## 2023-09-15 MED ORDER — SODIUM CHLORIDE (PF) 0.9 % IJ SOLN
INTRAMUSCULAR | Status: AC
  Filled 2023-09-15: qty 30

## 2023-09-15 MED ORDER — ONDANSETRON HCL 4 MG/2ML IJ SOLN
INTRAMUSCULAR | Status: DC | PRN
Start: 2023-09-15 — End: 2023-09-15
  Administered 2023-09-15: 4 mg via INTRAVENOUS

## 2023-09-15 MED ORDER — HYDROXYZINE HCL 25 MG PO TABS
25.0000 mg | ORAL_TABLET | Freq: Every evening | ORAL | Status: DC | PRN
  Administered 2023-09-15: 25 mg via ORAL
  Filled 2023-09-15: qty 1

## 2023-09-15 MED ORDER — PHENOL 1.4 % MT LIQD: 1.0000 | OROMUCOSAL | Status: DC | PRN

## 2023-09-15 MED ORDER — METHOCARBAMOL 1000 MG/10ML IJ SOLN: 500.0000 mg | Freq: Four times a day (QID) | INTRAMUSCULAR | Status: DC | PRN

## 2023-09-15 MED ORDER — METOCLOPRAMIDE HCL 5 MG/ML IJ SOLN: 5.0000 mg | Freq: Three times a day (TID) | INTRAMUSCULAR | Status: DC | PRN

## 2023-09-15 MED ORDER — PHENYLEPHRINE 80 MCG/ML (10ML) SYRINGE FOR IV PUSH (FOR BLOOD PRESSURE SUPPORT)
PREFILLED_SYRINGE | INTRAVENOUS | Status: DC | PRN
  Administered 2023-09-15: 80 ug via INTRAVENOUS
  Administered 2023-09-15: 120 ug via INTRAVENOUS
  Administered 2023-09-15: 80 ug via INTRAVENOUS
  Administered 2023-09-15: 160 ug via INTRAVENOUS
  Administered 2023-09-15: 80 ug via INTRAVENOUS

## 2023-09-15 MED ORDER — ORAL CARE MOUTH RINSE: 15.0000 mL | Freq: Once | OROMUCOSAL | Status: AC

## 2023-09-15 MED ORDER — MEPIVACAINE HCL (PF) 2 % IJ SOLN
INTRAMUSCULAR | Status: DC | PRN
  Administered 2023-09-15: 3 mL via EPIDURAL

## 2023-09-15 MED ORDER — ACETAMINOPHEN 500 MG PO TABS
1000.0000 mg | ORAL_TABLET | Freq: Four times a day (QID) | ORAL | Status: DC
  Administered 2023-09-15 – 2023-09-16 (×3): 1000 mg via ORAL
  Filled 2023-09-15 (×4): qty 2

## 2023-09-15 MED ORDER — DEXAMETHASONE SODIUM PHOSPHATE 10 MG/ML IJ SOLN
10.0000 mg | Freq: Once | INTRAMUSCULAR | Status: AC
  Administered 2023-09-16: 10 mg via INTRAVENOUS
  Filled 2023-09-15: qty 1

## 2023-09-15 MED ORDER — METOCLOPRAMIDE HCL 5 MG PO TABS: 5.0000 mg | ORAL_TABLET | Freq: Three times a day (TID) | ORAL | Status: DC | PRN

## 2023-09-15 MED ORDER — POLYETHYLENE GLYCOL 3350 17 G PO PACK
17.0000 g | PACK | Freq: Two times a day (BID) | ORAL | Status: DC
  Administered 2023-09-16: 17 g via ORAL
  Filled 2023-09-15 (×2): qty 1

## 2023-09-15 MED ORDER — PHENYLEPHRINE 80 MCG/ML (10ML) SYRINGE FOR IV PUSH (FOR BLOOD PRESSURE SUPPORT)
PREFILLED_SYRINGE | INTRAVENOUS | Status: AC
  Filled 2023-09-15: qty 20

## 2023-09-15 MED ORDER — STERILE WATER FOR IRRIGATION IR SOLN
Status: DC | PRN
  Administered 2023-09-15: 2000 mL

## 2023-09-15 MED ORDER — TRANEXAMIC ACID-NACL 1000-0.7 MG/100ML-% IV SOLN
1000.0000 mg | Freq: Once | INTRAVENOUS | Status: AC
  Administered 2023-09-15: 1000 mg via INTRAVENOUS
  Filled 2023-09-15: qty 100

## 2023-09-15 MED ORDER — ASPIRIN 81 MG PO CHEW
81.0000 mg | CHEWABLE_TABLET | Freq: Two times a day (BID) | ORAL | Status: DC
  Administered 2023-09-15 – 2023-09-16 (×2): 81 mg via ORAL
  Filled 2023-09-15 (×2): qty 1

## 2023-09-15 MED ORDER — POVIDONE-IODINE 10 % EX SWAB
2.0000 | Freq: Once | CUTANEOUS | Status: AC
  Administered 2023-09-15: 2 via TOPICAL

## 2023-09-15 MED ORDER — KETOROLAC TROMETHAMINE 30 MG/ML IJ SOLN
INTRAMUSCULAR | Status: AC
  Filled 2023-09-15: qty 1

## 2023-09-15 MED ORDER — BISACODYL 10 MG RE SUPP: 10.0000 mg | Freq: Every day | RECTAL | Status: DC | PRN

## 2023-09-15 MED ORDER — METHOCARBAMOL 500 MG PO TABS
500.0000 mg | ORAL_TABLET | Freq: Four times a day (QID) | ORAL | Status: DC | PRN
  Administered 2023-09-15: 500 mg via ORAL
  Filled 2023-09-15 (×2): qty 1

## 2023-09-15 MED ORDER — BUPIVACAINE-EPINEPHRINE (PF) 0.25% -1:200000 IJ SOLN
INTRAMUSCULAR | Status: AC
  Filled 2023-09-15: qty 30

## 2023-09-15 MED ORDER — ONDANSETRON HCL 4 MG PO TABS: 4.0000 mg | ORAL_TABLET | Freq: Four times a day (QID) | ORAL | Status: DC | PRN

## 2023-09-15 MED ORDER — LACTATED RINGERS IV SOLN: INTRAVENOUS | Status: DC

## 2023-09-15 MED ORDER — CEFAZOLIN SODIUM-DEXTROSE 2-4 GM/100ML-% IV SOLN
2.0000 g | Freq: Four times a day (QID) | INTRAVENOUS | Status: AC
  Administered 2023-09-15 – 2023-09-16 (×2): 2 g via INTRAVENOUS
  Filled 2023-09-15 (×2): qty 100

## 2023-09-15 MED ORDER — MIDAZOLAM HCL 2 MG/2ML IJ SOLN
2.0000 mg | INTRAMUSCULAR | Status: DC
  Filled 2023-09-15: qty 2

## 2023-09-15 MED ORDER — SODIUM CHLORIDE 0.9 % IV SOLN: INTRAVENOUS | Status: DC

## 2023-09-15 MED ORDER — SODIUM CHLORIDE (PF) 0.9 % IJ SOLN
INTRAMUSCULAR | Status: DC | PRN
  Administered 2023-09-15: 61 mL

## 2023-09-15 MED ORDER — ROPIVACAINE HCL 5 MG/ML IJ SOLN
INTRAMUSCULAR | Status: DC | PRN
  Administered 2023-09-15: 20 mL via PERINEURAL

## 2023-09-15 SURGICAL SUPPLY — 46 items
ATTUNE MED ANAT PAT 32 KNEE (Knees) IMPLANT
BAG COUNTER SPONGE SURGICOUNT (BAG) IMPLANT
BAG ZIPLOCK 12X15 (MISCELLANEOUS) ×2 IMPLANT
BASEPLATE TIB CMT FB PCKT SZ4 (Stem) IMPLANT
BLADE SAW SGTL 11.0X1.19X90.0M (BLADE) IMPLANT
BLADE SAW SGTL 13.0X1.19X90.0M (BLADE) ×2 IMPLANT
BNDG ELASTIC 6INX 5YD STR LF (GAUZE/BANDAGES/DRESSINGS) ×2 IMPLANT
BOWL SMART MIX CTS (DISPOSABLE) ×2 IMPLANT
CEMENT HV SMART SET (Cement) ×4 IMPLANT
COMPONENT FEM CMT ATTN KN 4 RT (Joint) IMPLANT
COVER SURGICAL LIGHT HANDLE (MISCELLANEOUS) ×2 IMPLANT
CUFF TRNQT CYL 34X4.125X (TOURNIQUET CUFF) ×2 IMPLANT
DERMABOND ADVANCED .7 DNX12 (GAUZE/BANDAGES/DRESSINGS) ×2 IMPLANT
DRAPE U-SHAPE 47X51 STRL (DRAPES) ×2 IMPLANT
DRESSING AQUACEL AG SP 3.5X10 (GAUZE/BANDAGES/DRESSINGS) ×2 IMPLANT
DRSG AQUACEL AG ADV 3.5X10 (GAUZE/BANDAGES/DRESSINGS) IMPLANT
DURAPREP 26ML APPLICATOR (WOUND CARE) ×4 IMPLANT
ELECT REM PT RETURN 15FT ADLT (MISCELLANEOUS) ×2 IMPLANT
GLOVE BIO SURGEON STRL SZ 6 (GLOVE) ×2 IMPLANT
GLOVE BIOGEL PI IND STRL 6.5 (GLOVE) ×2 IMPLANT
GLOVE BIOGEL PI IND STRL 7.5 (GLOVE) ×2 IMPLANT
GLOVE ORTHO TXT STRL SZ7.5 (GLOVE) ×4 IMPLANT
GOWN STRL REUS W/ TWL LRG LVL3 (GOWN DISPOSABLE) ×4 IMPLANT
HOLDER FOLEY CATH W/STRAP (MISCELLANEOUS) IMPLANT
INSERT TIB MED ATTUNE 4 7 RT (Insert) IMPLANT
KIT TURNOVER KIT A (KITS) ×2 IMPLANT
MANIFOLD NEPTUNE II (INSTRUMENTS) ×2 IMPLANT
NDL SAFETY ECLIPSE 18X1.5 (NEEDLE) IMPLANT
NS IRRIG 1000ML POUR BTL (IV SOLUTION) ×2 IMPLANT
PACK TOTAL KNEE CUSTOM (KITS) ×2 IMPLANT
PENCIL SMOKE EVACUATOR (MISCELLANEOUS) ×2 IMPLANT
PIN FIX SIGMA LCS THRD HI (PIN) IMPLANT
PROTECTOR NERVE ULNAR (MISCELLANEOUS) ×2 IMPLANT
SET HNDPC FAN SPRY TIP SCT (DISPOSABLE) ×2 IMPLANT
SET PAD KNEE POSITIONER (MISCELLANEOUS) ×2 IMPLANT
SPIKE FLUID TRANSFER (MISCELLANEOUS) ×4 IMPLANT
SUT MNCRL AB 4-0 PS2 18 (SUTURE) ×2 IMPLANT
SUT STRATAFIX PDS+ 0 24IN (SUTURE) ×2 IMPLANT
SUT VIC AB 1 CT1 36 (SUTURE) ×2 IMPLANT
SUT VIC AB 2-0 CT1 TAPERPNT 27 (SUTURE) ×4 IMPLANT
SYR 3ML LL SCALE MARK (SYRINGE) ×2 IMPLANT
TOWEL GREEN STERILE FF (TOWEL DISPOSABLE) ×2 IMPLANT
TRAY FOLEY MTR SLVR 16FR STAT (SET/KITS/TRAYS/PACK) ×2 IMPLANT
TUBE SUCTION HIGH CAP CLEAR NV (SUCTIONS) ×2 IMPLANT
WATER STERILE IRR 1000ML POUR (IV SOLUTION) ×4 IMPLANT
WRAP KNEE MAXI GEL POST OP (GAUZE/BANDAGES/DRESSINGS) ×2 IMPLANT

## 2023-09-15 NOTE — Transfer of Care (Signed)
 Immediate Anesthesia Transfer of Care Note  Patient: Taylor Silva  Procedure(s) Performed: ARTHROPLASTY, KNEE, TOTAL (Right: Knee)  Patient Location: PACU  Anesthesia Type:MAC and Spinal  Level of Consciousness: awake, alert , and oriented  Airway & Oxygen Therapy: Patient Spontanous Breathing  Post-op Assessment: Report given to RN and Post -op Vital signs reviewed and stable  Post vital signs: Reviewed and stable  Last Vitals:  Vitals Value Taken Time  BP 123/67 09/15/23 15:30  Temp 36.1 C 09/15/23 15:30  Pulse 59 09/15/23 15:33  Resp 18 09/15/23 15:33  SpO2 97 % 09/15/23 15:33  Vitals shown include unfiled device data.  Last Pain:  Vitals:   09/15/23 1335  TempSrc: Oral  PainSc:          Complications: No notable events documented.

## 2023-09-15 NOTE — Anesthesia Postprocedure Evaluation (Signed)
 Anesthesia Post Note  Patient: Taylor Silva  Procedure(s) Performed: ARTHROPLASTY, KNEE, TOTAL (Right: Knee)     Patient location during evaluation: PACU Anesthesia Type: Spinal Level of consciousness: awake and alert Pain management: pain level controlled Vital Signs Assessment: post-procedure vital signs reviewed and stable Respiratory status: spontaneous breathing, nonlabored ventilation, respiratory function stable and patient connected to nasal cannula oxygen Cardiovascular status: blood pressure returned to baseline and stable Postop Assessment: no apparent nausea or vomiting Anesthetic complications: no   No notable events documented.  Last Vitals:  Vitals:   09/15/23 1730 09/15/23 1758  BP: 124/80 109/78  Pulse: 77 68  Resp: 17 16  Temp: (!) 36.3 C 36.7 C  SpO2: 97% 98%    Last Pain:  Vitals:   09/15/23 1844  TempSrc:   PainSc: 3                  Lynwood MARLA Cornea

## 2023-09-15 NOTE — Anesthesia Procedure Notes (Signed)
 Anesthesia Regional Block: Adductor canal block   Pre-Anesthetic Checklist: , timeout performed,  Correct Patient, Correct Site, Correct Laterality,  Correct Procedure, Correct Position, site marked,  Risks and benefits discussed,  Surgical consent,  Pre-op evaluation,  At surgeon's request and post-op pain management  Laterality: Right  Prep: Maximum Sterile Barrier Precautions used, chloraprep       Needles:  Injection technique: Single-shot  Needle Type: Echogenic Needle      Needle Gauge: 20     Additional Needles:   Procedures:,,,, ultrasound used (permanent image in chart),,    Narrative:  Start time: 09/15/2023 1:35 PM End time: 09/15/2023 1:40 PM Injection made incrementally with aspirations every 5 mL.  Performed by: Personally  Anesthesiologist: Keneth Lynwood POUR, MD

## 2023-09-15 NOTE — H&P (Signed)
 TOTAL KNEE ADMISSION H&P  Patient is being admitted for right total knee arthroplasty.  Therapy Plans: outpatient therapy at [facility] Disposition: Home with son & daughter in law Planned DVT Prophylaxis: aspirin  81mg  BID DME needed: none PCP: Dr. Delayne - clearance received TXA: IV Allergies: hydrocodone /oxycodone  - , sulfa, tetracycline, erythromycin, morphine, amoxicillin Anesthesia Concerns: **NAUSEA/VOMITING BMI: 39.3 Last HgbA1c: not diabetic   Other: - reports an episode of presyncope with codeine based med - tramadol /oxycodone , robaxin , tylenol  - hx of gastric bypass - No hx of VTE or cancer   Subjective:  Chief Complaint:right knee pain.  HPI: Taylor Silva, 68 y.o. female, has a history of pain and functional disability in the right knee due to arthritis and has failed non-surgical conservative treatments for greater than 12 weeks to includeNSAID's and/or analgesics, corticosteriod injections, and activity modification.  Onset of symptoms was gradual, starting 2 years ago with gradually worsening course since that time. The patient noted no past surgery on the right knee(s).  Patient currently rates pain in the right knee(s) at 8 out of 10 with activity. Patient has worsening of pain with activity and weight bearing, pain that interferes with activities of daily living, and pain with passive range of motion.  Patient has evidence of joint space narrowing by imaging studies.  There is no active infection.  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   Preoperative clearance 05/19/2014   Hyperglycemia 04/06/2014   Hypertension 04/06/2014   Hyperlipidemia 04/06/2014   Headache 08/02/2013    Chronic rhinitis 08/02/2013   Obesity, Class III, BMI 40-49.9 (morbid obesity) 10/06/2012   Encounter for screening for malignant neoplasm of breast 06/15/2012   RLS (restless legs syndrome) 06/15/2012   Depression 06/06/2011   Joint pain 06/06/2011   OSA (obstructive sleep apnea) 06/06/2011   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   Past Medical History:  Diagnosis Date   Anxiety    Arthritis    Asthma    Depression    DJD (degenerative joint disease), lumbar    Fructose intolerance    GERD (gastroesophageal reflux disease)    H/O hiatal hernia    Hard of hearing    bilat    Headache    migraines   Heart murmur    echo done 80's neg   History of bronchitis    History of frequent urinary tract infections    History of vertigo    Hyperlipidemia    Hypertension    currently taking no medications    Obesity    Pinched nerve    in back   PONV (postoperative nausea and vomiting)    patch used last time   Pre-diabetes    Restless legs    Seasonal allergies    Sleep apnea    Intolerant to CPAP last sleep study   Tinnitus    Wears glasses     Past Surgical History:  Procedure Laterality Date   ABDOMINAL HYSTERECTOMY  1995   No oophorectomy   BREATH TEK H PYLORI N/A 06/23/2014   Procedure: BREATH TEK H PYLORI;  Surgeon: Camellia Blush, MD;  Location: [facility];  Service: General;  Laterality: N/A;   CHOLECYSTECTOMY N/A 07/12/2015   Procedure: LAPAROSCOPIC CHOLECYSTECTOMY WITH INTRAOPERATIVE CHOLANGIOGRAM;  Surgeon:  Camellia Blush, MD;  Location: [facility];  Service: General;  Laterality: N/A;   DIAGNOSTIC LAPAROSCOPY     DILATION AND CURETTAGE OF UTERUS     KNEE SURGERY  04/2009   right knee, meniscus tear   LAPAROSCOPIC GASTRIC SLEEVE RESECTION WITH HIATAL HERNIA REPAIR N/A 10/03/2014   Procedure: LAPAROSCOPIC GASTRIC SLEEVE RESECTION WITH HIATAL HERNIA REPAIR W UPPER ENDO;  Surgeon: Camellia Blush, MD;  Location: [facility];  Service: General;  Laterality: N/A;   ORIF ANKLE FRACTURE Right  10/17/2013   Procedure: OPEN REDUCTION INTERNAL FIXATION (ORIF) RIGHT ANKLE DISTAL FIBULA/LATERAL MALLEOLUS;  Surgeon: Marcey Raman, MD;  Location: [facility];  Service: Orthopedics;  Laterality: Right;   OTHER SURGICAL HISTORY  1995?   removed scar tissue from colon   thigh tuck Bilateral    inner thigh tuck   UPPER GI ENDOSCOPY N/A 10/03/2014   Procedure: UPPER GI ENDOSCOPY;  Surgeon: Camellia Blush, MD;  Location: [facility];  Service: General;  Laterality: N/A;   WRIST SURGERY  1980s   cyst removed from right wrist    No current facility-administered medications for this encounter.   Current Outpatient Medications  Medication Sig Dispense Refill Last Dose/Taking   Albuterol  Sulfate (PROAIR  RESPICLICK) 108 (90 Base) MCG/ACT AEPB Inhale 2 puffs into the lungs 4 (four) times daily as needed. 1 each 3 Taking As Needed   Calcium  Citrate-Vitamin D  (CALCIUM  CITRATE+D3 PETITES PO) Take 4 tablets by mouth daily.   Taking   FLUoxetine  (PROZAC ) 20 MG capsule Take 20 mg by mouth daily.   Taking   gabapentin  (NEURONTIN ) 600 MG tablet TAKE 1 TABLET BY MOUTH THREE TIMES DAILY 90 tablet 0 Taking   hydrOXYzine  (ATARAX ) 25 MG tablet TAKE ONE TO TWO TABLETS BY MOUTH DURING THE DAY AND ONE TO TWO TABLETS AT BEDTIME (Patient taking differently: Take 25-50 mg by mouth at bedtime as needed (sleep).) 120 tablet 0 Taking Differently   Krill Oil 500 MG CAPS Take 500 mg by mouth daily.   Taking   Pediatric Multivit-Minerals (FLINTSTONES COMPLETE PO) Take 2 tablets by mouth daily.   Taking   rosuvastatin  (CRESTOR ) 40 MG tablet Take 40 mg by mouth daily.   Taking   ACETAMINOPHEN -BUTALBITAL  50-325 MG TABS Take 1 tablet by mouth three times daily as needed (Patient not taking: Reported on 08/31/2023) 40 tablet 0 Not Taking   Azelastine  HCl 137 MCG/SPRAY SOLN USE 2 SPRAY(S) IN EACH NOSTRIL TWICE DAILY AS DIRECTED (Patient not taking: Reported on 08/31/2023) 30 mL 0 Not Taking   FLUoxetine  (PROZAC ) 40 MG capsule Take 1 capsule (40 mg  total) by mouth daily. (Patient not taking: Reported on 08/31/2023) 30 capsule 5 Not Taking   lidocaine  (LIDODERM ) 5 % Place 1 patch onto the skin daily. Remove & Discard patch within 12 hours or as directed by MD (Patient not taking: Reported on 08/31/2023) 10 patch 0 Not Taking   methocarbamol  (ROBAXIN ) 500 MG tablet Take 1 tablet (500 mg total) by mouth 2 (two) times daily. (Patient not taking: Reported on 08/31/2023) 10 tablet 0 Not Taking   montelukast  (SINGULAIR ) 10 MG tablet Take 1 tablet (10 mg total) by mouth at bedtime as needed. (Patient not taking: Reported on 08/31/2023) 90 tablet 1 Not Taking   propranolol  (INDERAL ) 20 MG tablet Take 1 tablet (20 mg total) by mouth 2 (two) times daily. (Patient not taking: Reported on 08/31/2023) 60 tablet 5 Not Taking   tiZANidine  (ZANAFLEX ) 2 MG tablet Take 0.5-2 tablets (1-4 mg total) by  mouth at bedtime as needed for muscle spasms. (Patient not taking: Reported on 08/31/2023) 30 tablet 2 Not Taking   Allergies  Allergen Reactions   Codeine Anaphylaxis   Erythromycin Hives   Tetracyclines & Related Hives   Darvon [Propoxyphene] Photosensitivity    Blurry vision for 3 days   Morphine And Codeine Dermatitis    burning in veins   Other Other (See Comments)    Tomatoes upset stomach, itchy     Penicillins Hives    Has patient had a PCN reaction causing immediate rash, facial/tongue/throat swelling, SOB or lightheadedness with hypotension: Yes Has patient had a PCN reaction causing severe rash involving mucus membranes or skin necrosis: No Has patient had a PCN reaction that required hospitalization No Has patient had a PCN reaction occurring within the last 10 years: No If all of the above answers are NO, then may proceed with Cephalosporin use.    Sulfa Antibiotics Nausea And Vomiting    Social History   Tobacco Use   Smoking status: Never   Smokeless tobacco: Never  Substance Use Topics   Alcohol use: No    Family History  Problem  Relation Age of Onset   Diabetes Mother    Hyperlipidemia Mother    Hypertension Mother    Heart attack Mother        10 y/o   Diabetes Sister    Hyperlipidemia Sister    Hypertension Sister    Diabetes Brother    Hyperlipidemia Brother    Hypertension Brother    Heart attack Brother    Heart disease Father    Leukemia Father    Diabetes Maternal Grandmother    Hyperlipidemia Maternal Grandmother    Hypertension Maternal Grandmother    Breast cancer Other        GM   Colon cancer Neg Hx      Review of Systems  Constitutional:  Negative for chills and fever.  Respiratory:  Negative for cough and shortness of breath.   Cardiovascular:  Negative for chest pain.  Gastrointestinal:  Negative for nausea and vomiting.  Musculoskeletal:  Positive for arthralgias.     Objective:  Physical Exam Right knee exam: No palpable effusion, warmth erythema Slight flexion contracture with flexion of 110 degrees Tenderness over the medial and anterior aspect knee with crepitation noted with flexion Normal ipsilateral right hip exam without groin pain or referred pain No lower extremity edema, erythema or calf tenderness  Vital signs in last 24 hours:    Labs:   Estimated body mass index is 38.41 kg/m as calculated from the following:   Height as of 09/03/23: 5' 6 (1.676 m).   Weight as of 09/03/23: 108 kg.   Imaging Review Plain radiographs demonstrate severe degenerative joint disease of the right knee(s). The overall alignment isneutral. The bone quality appears to be adequate for age and reported activity level.      Assessment/Plan:  End stage arthritis, right knee   The patient history, physical examination, clinical judgment of the provider and imaging studies are consistent with end stage degenerative joint disease of the right knee(s) and total knee arthroplasty is deemed medically necessary. The treatment options including medical management, injection therapy  arthroscopy and arthroplasty were discussed at length. The risks and benefits of total knee arthroplasty were presented and reviewed. The risks due to aseptic loosening, infection, stiffness, patella tracking problems, thromboembolic complications and other imponderables were discussed. The patient acknowledged the explanation, agreed to proceed with the plan  and consent was signed. Patient is being admitted for inpatient treatment for surgery, pain control, PT, OT, prophylactic antibiotics, VTE prophylaxis, progressive ambulation and ADL's and discharge planning. The patient is planning to be discharged home     Patient's anticipated LOS is less than 2 midnights, meeting these requirements: - Younger than 65 - Lives within 1 hour of care - Has a competent adult at home to recover with post-op recover - NO history of  - Chronic pain requiring opiods  - Diabetes  - Coronary Artery Disease  - Heart failure  - Heart attack  - Stroke  - DVT/VTE  - Cardiac arrhythmia  - Respiratory Failure/COPD  - Renal failure  - Anemia  - Advanced Liver disease  Rosina Calin, PA-C Orthopedic Surgery [facility] [place] [phone]

## 2023-09-15 NOTE — Discharge Instructions (Signed)

## 2023-09-15 NOTE — Anesthesia Preprocedure Evaluation (Signed)
 Anesthesia Evaluation  Patient identified by MRN, date of birth, ID band Patient awake    Reviewed: Allergy & Precautions, NPO status , Patient's Chart, lab work & pertinent test results, reviewed documented beta blocker date and time   History of Anesthesia Complications (+) PONV and history of anesthetic complications  Airway Mallampati: III  TM Distance: >3 FB     Dental no notable dental hx.    Pulmonary asthma , sleep apnea , neg COPD   breath sounds clear to auscultation       Cardiovascular hypertension, (-) angina (-) CAD and (-) Past MI + Valvular Problems/Murmurs  Rhythm:Regular Rate:Normal     Neuro/Psych  Headaches, neg Seizures PSYCHIATRIC DISORDERS Anxiety Depression     Neuromuscular disease    GI/Hepatic hiatal hernia,GERD  ,,(+) neg Cirrhosis        Endo/Other    Renal/GU Renal disease     Musculoskeletal  (+) Arthritis , Osteoarthritis,    Abdominal   Peds  Hematology   Anesthesia Other Findings   Reproductive/Obstetrics                              Anesthesia Physical Anesthesia Plan  ASA: 2  Anesthesia Plan: Spinal   Post-op Pain Management: Regional block*   Induction: Intravenous  PONV Risk Score and Plan: 4 or greater and Ondansetron  and Propofol  infusion  Airway Management Planned: Simple Face Mask  Additional Equipment:   Intra-op Plan:   Post-operative Plan:   Informed Consent: I have reviewed the patients History and Physical, chart, labs and discussed the procedure including the risks, benefits and alternatives for the proposed anesthesia with the patient or authorized representative who has indicated his/her understanding and acceptance.     Dental advisory given  Plan Discussed with: CRNA  Anesthesia Plan Comments:          Anesthesia Quick Evaluation

## 2023-09-15 NOTE — Anesthesia Procedure Notes (Signed)
 Spinal  Patient location during procedure: OR Start time: 09/15/2023 1:50 PM End time: 09/15/2023 1:52 PM Reason for block: surgical anesthesia Staffing Performed: anesthesiologist  Anesthesiologist: Keneth Lynwood POUR, MD Performed by: Keneth Lynwood POUR, MD Authorized by: Keneth Lynwood POUR, MD   Preanesthetic Checklist Completed: patient identified, IV checked, site marked, risks and benefits discussed, surgical consent, monitors and equipment checked, pre-op evaluation and timeout performed Spinal Block Patient position: sitting Prep: DuraPrep Patient monitoring: heart rate, cardiac monitor, continuous pulse ox and blood pressure Approach: midline Location: L3-4 Injection technique: single-shot Needle Needle type: Sprotte  Needle gauge: 24 G Needle length: 9 cm Assessment Sensory level: T4 Events: CSF return

## 2023-09-15 NOTE — Care Plan (Signed)
 Ortho Bundle Case Management Note  Patient Details  Name: Taylor Silva MRN: [id number] Date of Birth: 1955-02-18  RT TKA on 09/15/23  DCP: Home with son and daughter-in-law DME: No needs, has RW and cane PT: [facility]                   DME Arranged:  N/A DME Agency:  NA  HH Arranged:    HH Agency:     Additional Comments: Please contact me with any questions of if this plan should need to change.  Burnard Dross, Case Manager [facility] [phone]  Ext. [phone]   09/15/2023, 12:31 PM

## 2023-09-15 NOTE — Op Note (Signed)
 NAME:  Taylor Silva                      MEDICAL RECORD NO.:  [id number]                             FACILITY:  [facility]      PHYSICIAN:  Donnice BIRCH. Ernie, M.D.  DATE OF BIRTH:  1955-08-15      DATE OF PROCEDURE:  09/15/2023                                     OPERATIVE REPORT         PREOPERATIVE DIAGNOSIS:  Right knee osteoarthritis.      POSTOPERATIVE DIAGNOSIS:  Right knee osteoarthritis.      FINDINGS:  The patient was noted to have complete loss of cartilage and   bone-on-bone arthritis with associated osteophytes in the medial and patellofemoral compartments of   the knee with a significant synovitis and associated effusion.  The patient had failed months of conservative treatment including medications, injection therapy, activity modification.     PROCEDURE:  Right total knee replacement.      COMPONENTS USED:  DePuy Attune FB CR MS knee   system, a size 4 standard femur, 4 tibia, size 7 mm CR MS AOX insert, and 32 anatomic patellar   button.      SURGEON:  Donnice BIRCH. Ernie, M.D.      ASSISTANT:  Rosina Calin, PA-C.      ANESTHESIA:  Regional and Spinal.      SPECIMENS:  None.      COMPLICATION:  None.      DRAINS:  None.  EBL: <100 cc      TOURNIQUET TIME:   Total Tourniquet Time Documented: Thigh (Right) - 27 minutes Total: Thigh (Right) - 27 minutes  .      The patient was stable to the recovery room.      INDICATION FOR PROCEDURE:  Taylor Silva is a 68 y.o. female patient of   mine.  The patient had been seen, evaluated, and treated for months conservatively in the   office with medication, activity modification, and injections.  The patient had   radiographic changes of bone-on-bone arthritis with endplate sclerosis and osteophytes noted.  Based on the radiographic changes and failed conservative measures, the patient   decided to proceed with definitive treatment, total knee replacement.  Risks of infection, DVT, component failure, need for  revision surgery, neurovascular injury were reviewed in the office setting.  The postop course was reviewed stressing the efforts to maximize post-operative satisfaction and function.  Consent was obtained for benefit of pain   relief.      PROCEDURE IN DETAIL:  The patient was brought to the operative theater.   Once adequate anesthesia, preoperative antibiotics, 2 gm of Ancef ,1 gm of Tranexamic Acid , and 10 mg of Decadron  administered, the patient was positioned supine with a right thigh tourniquet placed.  The  right lower extremity was prepped and draped in sterile fashion.  A time-   out was performed identifying the patient, planned procedure, and the appropriate extremity.      The right lower extremity was placed in the [facility] leg holder.  The leg was   exsanguinated, tourniquet elevated to 225 mmHg.  A midline incision was  made followed by median parapatellar arthrotomy.  Following initial   exposure, attention was first directed to the patella.  Precut   measurement was noted to be 21 mm.  I resected down to 13 mm and used a   32 anatomic patellar button to restore patellar height as well as cover the cut surface.      The lug holes were drilled and a metal shim was placed to protect the   patella from retractors and saw blade during the procedure.      At this point, attention was now directed to the femur.  The femoral   canal was opened with a drill, irrigated to try to prevent fat emboli.  An   intramedullary rod was passed at 3 degrees valgus, 9 mm of bone was   resected off the distal femur.  Following this resection, the tibia was   subluxated anteriorly.  Using the extramedullary guide, 2 mm of bone was resected off   the proximal medial tibia.  We confirmed the gap would be   stable medially and laterally with a size 6 spacer block as well as confirmed that the tibial cut was perpendicular in the coronal plane, checking with an alignment rod.      Once this was done, I  sized the femur to be a size 4 in the anterior-   posterior dimension, chose a standard component based on medial and   lateral dimension.  The size 4 rotation block was then pinned in   position anterior referenced using the C-clamp to set rotation.  The   anterior, posterior, and  chamfer cuts were made without difficulty nor   notching making certain that I was along the anterior cortex to help   with flexion gap stability.      The final box cut was made off the lateral aspect of distal femur.      At this point, the tibia was sized to be a size 4.  The size 4 tray was   then pinned in position through the medial third of the tubercle,   drilled, and keel punched.  Trial reduction was now carried with a 4 femur,  4 tibia, a size 7 mm CR MS insert, and the 32 anatomic patella botton.  The knee was brought to full extension with good flexion stability with the patella   tracking through the trochlea without application of pressure.  Given   all these findings the trial components removed.  Final components were   opened and cement was mixed.  The knee was irrigated with normal saline solution and pulse lavage.  The synovial lining was   then injected with 30 cc of 0.25% Marcaine  with epinephrine , 1 cc of Toradol  and 30 cc of NS for a total of 61 cc.     Final implants were then cemented onto cleaned and dried cut surfaces of bone with the knee brought to extension with a size 7 mm CR MS trial insert.      Once the cement had fully cured, excess cement was removed   throughout the knee.  I confirmed that I was satisfied with the range of   motion and stability, and the final size 7 mm CR MS AOX insert was chosen.  It was   placed into the knee.      The tourniquet had been let down at 27 minutes.  No significant   hemostasis was required.  The extensor mechanism was then reapproximated  using #1 Vicryl and #1 Stratafix sutures with the knee   in flexion.  The   remaining wound was closed  with 2-0 Vicryl and running 4-0 Monocryl.   The knee was cleaned, dried, dressed sterilely using Dermabond and   Aquacel dressing.  The patient was then   brought to recovery room in stable condition, tolerating the procedure   well.   Please note that Physician Assistant, Rosina Calin, PA-C was present for the entirety of the case, and was utilized for pre-operative positioning, peri-operative retractor management, general facilitation of the procedure and for primary wound closure at the end of the case.              Donnice CORDOBA Ernie, M.D.    09/15/2023 3:04 PM

## 2023-09-16 ENCOUNTER — Other Ambulatory Visit (HOSPITAL_COMMUNITY): Payer: Self-pay

## 2023-09-16 ENCOUNTER — Encounter (HOSPITAL_COMMUNITY): Payer: Self-pay | Admitting: Orthopedic Surgery

## 2023-09-16 DIAGNOSIS — M1711 Unilateral primary osteoarthritis, right knee: Secondary | ICD-10-CM | POA: Diagnosis not present

## 2023-09-16 LAB — BASIC METABOLIC PANEL WITH GFR
Anion gap: 8 (ref 5–15)
BUN: 16 mg/dL (ref 8–23)
CO2: 23 mmol/L (ref 22–32)
Calcium: 8.5 mg/dL — ABNORMAL LOW (ref 8.9–10.3)
Chloride: 105 mmol/L (ref 98–111)
Creatinine, Ser: 0.95 mg/dL (ref 0.44–1.00)
GFR, Estimated: >60 mL/min (ref >60)
Glucose, Bld: 236 mg/dL — ABNORMAL HIGH (ref 70–99)
Potassium: 4.5 mmol/L (ref 3.5–5.1)
Sodium: 137 mmol/L (ref 135–145)

## 2023-09-16 LAB — CBC
HCT: 35.6 % — ABNORMAL LOW (ref 36.0–46.0)
Hemoglobin: 11.3 g/dL — ABNORMAL LOW (ref 12.0–15.0)
MCH: 30.4 pg (ref 26.0–34.0)
MCHC: 31.7 g/dL (ref 30.0–36.0)
MCV: 95.7 fL (ref 80.0–100.0)
Platelets: 156 K/uL (ref 150–400)
RBC: 3.72 MIL/uL — ABNORMAL LOW (ref 3.87–5.11)
RDW: 12.4 % (ref 11.5–15.5)
WBC: 9.9 K/uL (ref 4.0–10.5)
nRBC: 0 % (ref 0.0–0.2)

## 2023-09-16 MED ORDER — POLYETHYLENE GLYCOL 3350 17 GM/SCOOP PO POWD
17.0000 g | Freq: Two times a day (BID) | ORAL | 0 refills | Status: AC
  Filled 2023-09-16: qty 238, 7d supply, fill #0

## 2023-09-16 MED ORDER — METHOCARBAMOL 500 MG PO TABS
500.0000 mg | ORAL_TABLET | Freq: Four times a day (QID) | ORAL | 2 refills | Status: AC | PRN
  Filled 2023-09-16: qty 40, 10d supply, fill #0

## 2023-09-16 MED ORDER — SENNA 8.6 MG PO TABS
2.0000 | ORAL_TABLET | Freq: Every day | ORAL | 0 refills | Status: AC
  Filled 2023-09-16: qty 28, 14d supply, fill #0

## 2023-09-16 MED ORDER — ASPIRIN 81 MG PO CHEW
81.0000 mg | CHEWABLE_TABLET | Freq: Two times a day (BID) | ORAL | 0 refills | Status: AC
  Filled 2023-09-16: qty 56, 28d supply, fill #0

## 2023-09-16 MED ORDER — ACETAMINOPHEN 500 MG PO TABS: 1000.0000 mg | ORAL_TABLET | Freq: Four times a day (QID) | ORAL | Status: DC

## 2023-09-16 MED ORDER — CEFADROXIL 500 MG PO CAPS
500.0000 mg | ORAL_CAPSULE | Freq: Two times a day (BID) | ORAL | 0 refills | Status: AC
  Filled 2023-09-16: qty 14, 7d supply, fill #0

## 2023-09-16 MED ORDER — TRAMADOL HCL 50 MG PO TABS
50.0000 mg | ORAL_TABLET | Freq: Four times a day (QID) | ORAL | 0 refills | Status: DC | PRN
  Filled 2023-09-16: qty 30, 7d supply, fill #0

## 2023-09-16 MED ORDER — SODIUM CHLORIDE 0.9 % IV BOLUS: 500.0000 mL | Freq: Once | INTRAVENOUS | Status: DC

## 2023-09-16 NOTE — Progress Notes (Signed)
 Discharge instructions given to patient questions asked and answered.

## 2023-09-16 NOTE — TOC Transition Note (Signed)
 Transition of Care [facility]) - Discharge Note   Patient Details  Name: Taylor Silva MRN: [id number] Date of Birth: 1955/09/24  [facility] of [facility]) CM/SW Contact:  Sheri ONEIDA Sharps, LCSW Phone Number: 09/16/2023, 11:40 AM   Clinical Narrative:    Pt from home with son and daughter-in-law. No DME needs, has RW and cane. Plan for therapy, [facility]. No IPCM needs.   Final next level of care: OP Rehab Barriers to Discharge: Barriers Resolved   Patient Goals and CMS Choice Patient states their goals for this hospitalization and ongoing recovery are:: return home   Choice offered to / list presented to : NA      Discharge Placement                       Discharge Plan and Services Additional resources added to the After Visit Summary for                  DME Arranged: N/A DME Agency: NA       HH Arranged: NA HH Agency: NA        Social Drivers of Health (SDOH) Interventions SDOH Screenings   Food Insecurity: No Food Insecurity (09/15/2023)  Housing: Low Risk  (09/15/2023)  Transportation Needs: No Transportation Needs (09/15/2023)  Utilities: Not At Risk (09/15/2023)  Depression (PHQ2-9): Medium Risk (01/17/2020)  Social Connections: Patient Declined (09/15/2023)  Tobacco Use: Low Risk  (09/15/2023)     Readmission Risk Interventions     No data to display

## 2023-09-16 NOTE — Evaluation (Signed)
 Physical Therapy Evaluation Patient Details Name: Taylor Silva MRN: [id number] DOB: 28-Mar-1955 Today's Date: 09/16/2023  History of Present Illness  83 yoo female S/P RTKA 09/15/23. PMH: vertigo, R Ankle fx, depression, HTN  Clinical Impression  Pt admitted with above diagnosis.  Pt currently with functional limitations due to the deficits listed below (see PT Problem List). Pt will benefit from acute skilled PT to increase their independence and safety with mobility to allow discharge.     The patient mobilized initially well, ambulated to Br using RW, performed  pericare, dressed self, after ~ 15 minutes and standing, reported feeling weak and dizzy. Did ambulate back to recliner x 20'. Mildly clammy. BP 103/66, about what last BP taken earlier.  Will return, monitorBP and progress, decode if able to Dc to day.       If plan is discharge home, recommend the following: A little help with walking and/or transfers;A little help with bathing/dressing/bathroom;Assistance with cooking/housework;Assist for transportation;Help with stairs or ramp for entrance   Can travel by private vehicle        Equipment Recommendations Rolling walker (2 wheels)  Recommendations for Other Services       Functional Status Assessment Patient has had a recent decline in their functional status and demonstrates the ability to make significant improvements in function in a reasonable and predictable amount of time.     Precautions / Restrictions Precautions Precautions: Knee;Fall Precaution Booklet Issued: Yes (comment) Restrictions Weight Bearing Restrictions Per Provider Order: No      Mobility  Bed Mobility Overal bed mobility: Needs Assistance Bed Mobility: Supine to Sit     Supine to sit: Supervision     General bed mobility comments: pt mananged the right leg    Transfers Overall transfer level: Needs assistance Equipment used: Rolling walker (2 wheels) Transfers: Sit to/from  Stand Sit to Stand: Min assist           General transfer comment: cues for hand and RLE position,    Ambulation/Gait Ambulation/Gait assistance: Contact guard assist, Min assist Gait Distance (Feet): 20 Feet (x 2) Assistive device: Rolling walker (2 wheels) Gait Pattern/deviations: Step-to pattern, Antalgic Gait velocity: decr     General Gait Details: cues for safety,  Stairs            Wheelchair Mobility     Tilt Bed    Modified Rankin (Stroke Patients Only)       Balance Overall balance assessment: Needs assistance, History of Falls Sitting-balance support: No upper extremity supported, Feet supported Sitting balance-Leahy Scale: Good     Standing balance support: During functional activity, Single extremity supported, Reliant on assistive device for balance Standing balance-Leahy Scale: Fair Standing balance comment: perfoprmed self pericare standing                             Pertinent Vitals/Pain Pain Assessment Pain Assessment: Faces Faces Pain Scale: Hurts little more Pain Location: right knee Pain Descriptors / Indicators: Discomfort Pain Intervention(s): Monitored during session, Ice applied    Home Living Family/patient expects to be discharged to:: Private residence Living Arrangements: Children;Other relatives Available Help at Discharge: Family Type of Home: House Home Access: Stairs to enter Entrance Stairs-Rails: Left Entrance Stairs-Number of Steps: 4   Home Layout: One level Home Equipment: Cane - single point      Prior Function Prior Level of Function : Independent/Modified Independent  Mobility Comments: has had recent fall       Extremity/Trunk Assessment   Upper Extremity Assessment Upper Extremity Assessment: Overall WFL for tasks assessed    Lower Extremity Assessment Lower Extremity Assessment: RLE deficits/detail RLE Deficits / Details: knee flex 10- 60    Cervical / Trunk  Assessment Cervical / Trunk Assessment: Normal  Communication   Communication Communication: No apparent difficulties Factors Affecting Communication: Hearing impaired    Cognition Arousal: Alert Behavior During Therapy: WFL for tasks assessed/performed   PT - Cognitive impairments: No apparent impairments                         Following commands: Intact       Cueing       General Comments      Exercises Total Joint Exercises Ankle Circles/Pumps: AROM Quad Sets: AROM, 5 reps, Both Heel Slides: AAROM, Right, 5 reps Hip ABduction/ADduction: AAROM, Right, 5 reps Straight Leg Raises: AAROM, Right, 10 reps   Assessment/Plan    PT Assessment Patient needs continued PT services  PT Problem List Decreased strength;Pain;Decreased activity tolerance;Decreased knowledge of use of DME;Decreased safety awareness;Decreased mobility       PT Treatment Interventions DME instruction;Gait training;Stair training;Functional mobility training;Therapeutic activities;Therapeutic exercise;Patient/family education    PT Goals (Current goals can be found in the Care Plan section)  Acute Rehab PT Goals Patient Stated Goal: go home PT Goal Formulation: With patient Time For Goal Achievement: 09/23/23 Potential to Achieve Goals: Good    Frequency 7X/week     Co-evaluation               AM-PAC PT 6 Clicks Mobility  Outcome Measure Help needed turning from your back to your side while in a flat bed without using bedrails?: A Little Help needed moving from lying on your back to sitting on the side of a flat bed without using bedrails?: A Little Help needed moving to and from a bed to a chair (including a wheelchair)?: A Little Help needed standing up from a chair using your arms (e.g., wheelchair or bedside chair)?: A Little Help needed to walk in hospital room?: A Little Help needed climbing 3-5 steps with a railing? : A Lot 6 Click Score: 17    End of Session  Equipment Utilized During Treatment: Gait belt Activity Tolerance: Treatment limited secondary to medical complications (Comment) Patient left: in chair;with call bell/phone within reach;with chair alarm set Nurse Communication: Mobility status PT Visit Diagnosis: Unsteadiness on feet (R26.81);Other abnormalities of gait and mobility (R26.89);Repeated falls (R29.6);Muscle weakness (generalized) (M62.81);History of falling (Z91.81);Difficulty in walking, not elsewhere classified (R26.2);Pain Pain - Right/Left: Right Pain - part of body: Knee    Time: 9158-9070 PT Time Calculation (min) (ACUTE ONLY): 48 min   Charges:   PT Evaluation $PT Eval Low Complexity: 1 Low PT Treatments $Gait Training: 8-22 mins $Therapeutic Exercise: 8-22 mins PT General Charges $$ ACUTE PT VISIT: 1 Visit         Darice Potters PT Acute Rehabilitation Services Office [phone]   Potters Darice Norris 09/16/2023, 9:42 AM

## 2023-09-16 NOTE — Progress Notes (Signed)
 Discharge meds in a secure bag delivered to patient in room by this RN. Pt asked about a walker- it was understood that patient had a walker at home- TOC and Rosina D. PA-C notified via secure chat. Waiting on DME delivery and 2nd session of PT.

## 2023-09-16 NOTE — Progress Notes (Signed)
 Physical Therapy Treatment Patient Details Name: Taylor ROVIRA MRN: [id number] DOB: [birth year] Today's Date: 09/16/2023   History of Present Illness 48 yoo female S/P RTKA 09/15/23. PMH: vertigo, R Ankle fx, depression, HTN    PT Comments   Patient received fluids, feeling better.  Patient practiced  steps  forward with cane and simulated pole, not as  smooth/ safe, so practiced backwards with RW which  presents much  safer.Provided written instruction as  family not present.   Patient has met goals  for Dc.   If plan is discharge home, recommend the following: A little help with walking and/or transfers;A little help with bathing/dressing/bathroom;Assistance with cooking/housework;Assist for transportation;Help with stairs or ramp for entrance   Can travel by private vehicle        Equipment Recommendations  Rolling walker (2 wheels)    Recommendations for Other Services       Precautions / Restrictions Precautions Precautions: Knee;Fall Precaution Booklet Issued: Yes (comment) Restrictions Weight Bearing Restrictions Per Provider Order: No     Mobility  Bed Mobility Overal bed mobility: Needs Assistance Bed Mobility: Supine to Sit     Supine to sit: Supervision     General bed mobility comments: in recliner    Transfers Overall transfer level: Needs assistance Equipment used: Rolling walker (2 wheels) Transfers: Sit to/from Stand Sit to Stand: Contact guard assist           General transfer comment: cues for hand and RLE position,    Ambulation/Gait Ambulation/Gait assistance: Contact guard assist, Min assist Gait Distance (Feet): 20 Feet (then 30, 20) Assistive device: Rolling walker (2 wheels) Gait Pattern/deviations: Step-to pattern, Antalgic Gait velocity: decr     General Gait Details: cues for safety,   Stairs Stairs: Yes Stairs assistance: Min assist, +2 safety/equipment Stair Management: One rail Left, Step to pattern, Forwards,  Backwards, With walker   General stair comments: pt only has a vertical pole on left, practiced forward  with cane and pole, then backwards with RW   Wheelchair Mobility     Tilt Bed    Modified Rankin (Stroke Patients Only)       Balance Overall balance assessment: Needs assistance, History of Falls Sitting-balance support: No upper extremity supported, Feet supported Sitting balance-Leahy Scale: Good     Standing balance support: During functional activity, Single extremity supported, Reliant on assistive device for balance Standing balance-Leahy Scale: Fair Standing balance comment: perfoprmed self pericare standing                            Communication    Cognition Arousal: Alert Behavior During Therapy: WFL for tasks assessed/performed   PT - Cognitive impairments: No apparent impairments                         Following commands: Intact      Cueing    Exercises   General Comments        Pertinent Vitals/Pain Pain Assessment Pain Assessment: Faces Pain Score: 4  Faces Pain Scale: Hurts little more Pain Location: right knee Pain Descriptors / Indicators: Discomfort Pain Intervention(s): Monitored during session, Premedicated before session    Home Living                          Prior Function            PT Goals (current goals  can now be found in the care plan section) Progress towards PT goals: Progressing toward goals    Frequency    7X/week      PT Plan      Co-evaluation              AM-PAC PT 6 Clicks Mobility   Outcome Measure  Help needed turning from your back to your side while in a flat bed without using bedrails?: A Little Help needed moving from lying on your back to sitting on the side of a flat bed without using bedrails?: A Little Help needed moving to and from a bed to a chair (including a wheelchair)?: A Little Help needed standing up from a chair using your arms (e.g.,  wheelchair or bedside chair)?: A Little Help needed to walk in hospital room?: A Little Help needed climbing 3-5 steps with a railing? : A Little 6 Click Score: 18    End of Session Equipment Utilized During Treatment: Gait belt Activity Tolerance: Patient tolerated treatment well Patient left: in chair;with call bell/phone within reach;with chair alarm set Nurse Communication: Mobility status PT Visit Diagnosis: Unsteadiness on feet (R26.81);Other abnormalities of gait and mobility (R26.89);Repeated falls (R29.6);Muscle weakness (generalized) (M62.81);History of falling (Z91.81);Difficulty in walking, not elsewhere classified (R26.2);Pain Pain - Right/Left: Right Pain - part of body: Knee     Time: 1400-1425 PT Time Calculation (min) (ACUTE ONLY): 25 min  Charges:    $Gait Training: 23-37 mins PT General Charges $$ ACUTE PT VISIT: 1 Visit                     Darice Potters PT Acute Rehabilitation Services Office [phone]  Potters Darice Norris 09/16/2023, 3:49 PM

## 2023-09-16 NOTE — Plan of Care (Signed)
  Problem: Education: Goal: Knowledge of General Education information will improve Description: Including pain rating scale, medication(s)/side effects and non-pharmacologic comfort measures Outcome: Progressing   Problem: Health Behavior/Discharge Planning: Goal: Ability to manage health-related needs will improve Outcome: Progressing   Problem: Education: Goal: Knowledge of the prescribed therapeutic regimen will improve Outcome: Progressing   Problem: Pain Management: Goal: Pain level will decrease with appropriate interventions Outcome: Progressing   Problem: Skin Integrity: Goal: Will show signs of wound healing Outcome: Progressing

## 2023-09-16 NOTE — Progress Notes (Signed)
 Subjective: 1 Day Post-Op Procedure(s) (LRB): ARTHROPLASTY, KNEE, TOTAL (Right) Patient reports pain as mild.   Patient seen in rounds for Dr. Ernie. Patient is well, and has had no acute complaints or problems. No acute events overnight. Foley catheter removed. Patient has not been up with PT yet.  We will start therapy today.   Objective: Vital signs in last 24 hours: Temp:  [97 F (36.1 C)-98.5 F (36.9 C)] 98.1 F (36.7 C) (09/10 0546) Pulse Rate:  [54-84] 55 (09/10 0546) Resp:  [11-20] 18 (09/10 0546) BP: (79-146)/(50-110) 100/56 (09/10 0546) SpO2:  [94 %-100 %] 96 % (09/10 0546) Weight:  [891 kg] 108 kg (09/09 1232)  Intake/Output from previous day:  Intake/Output Summary (Last 24 hours) at 09/16/2023 0820 Last data filed at 09/16/2023 0601 Gross per 24 hour  Intake 2260 ml  Output 2775 ml  Net -515 ml     Intake/Output this shift: No intake/output data recorded.  Labs: Recent Labs    09/16/23 0445  HGB 11.3*   Recent Labs    09/16/23 0445  WBC 9.9  RBC 3.72*  HCT 35.6*  PLT 156   Recent Labs    09/16/23 0445  NA 137  K 4.5  CL 105  CO2 23  BUN 16  CREATININE 0.95  GLUCOSE 236*  CALCIUM  8.5*   No results for input(s): LABPT, INR in the last 72 hours.  Exam: General - Patient is Alert and Oriented Extremity - Neurologically intact Sensation intact distally Intact pulses distally Dorsiflexion/Plantar flexion intact Dressing - dressing C/D/I Motor Function - intact, moving foot and toes well on exam.   Past Medical History:  Diagnosis Date   Anxiety    Arthritis    Asthma    Depression    DJD (degenerative joint disease), lumbar    Fructose intolerance    GERD (gastroesophageal reflux disease)    H/O hiatal hernia    Hard of hearing    bilat    Headache    migraines   Heart murmur    echo done 80's neg   History of bronchitis    History of frequent urinary tract infections    History of vertigo    Hyperlipidemia     Hypertension    currently taking no medications    Obesity    Pinched nerve    in back   PONV (postoperative nausea and vomiting)    patch used last time   Pre-diabetes    Restless legs    Seasonal allergies    Sleep apnea    Intolerant to CPAP last sleep study   Tinnitus    Wears glasses     Assessment/Plan: 1 Day Post-Op Procedure(s) (LRB): ARTHROPLASTY, KNEE, TOTAL (Right) Principal Problem:   S/P total knee arthroplasty, right  Estimated body mass index is 38.41 kg/m as calculated from the following:   Height as of this encounter: 5' 6 (1.676 m).   Weight as of this encounter: 108 kg. Advance diet Up with therapy D/C IV fluids   Patient's anticipated LOS is less than 2 midnights, meeting these requirements: - Younger than 56 - Lives within 1 hour of care - Has a competent adult at home to recover with post-op recover - NO history of  - Chronic pain requiring opiods  - Diabetes  - Coronary Artery Disease  - Heart failure  - Heart attack  - Stroke  - DVT/VTE  - Cardiac arrhythmia  - Respiratory Failure/COPD  - Renal failure  -  Anemia  - Advanced Liver disease     DVT Prophylaxis - Aspirin  Weight bearing as tolerated.  Hgb stable at 11.3 this AM.  Will deliver meds   Plan is to go Home after hospital stay. Plan for discharge today following 1-2 sessions of PT as long as they are meeting their goals. Patient is scheduled for OPPT. Follow up in the office in 2 weeks.   Rosina Calin, PA-C Orthopedic Surgery [phone] 09/16/2023, 8:20 AM

## 2023-09-23 ENCOUNTER — Ambulatory Visit: Admitting: Physical Therapy

## 2023-09-23 NOTE — Therapy (Signed)
 OUTPATIENT PHYSICAL THERAPY LOWER EXTREMITY EVALUATION   Patient Name: Taylor Silva MRN: [id number] DOB:[birth year], 68 y.o., female Today's Date: 09/24/2023  END OF SESSION:  PT End of Session - 09/24/23 1155     Visit Number 1    Date for Recertification  12/17/23    Authorization Type Medicare    PT Start Time 1155    PT Stop Time 1230    PT Time Calculation (min) 35 min    Activity Tolerance Patient limited by pain          Past Medical History:  Diagnosis Date   Anxiety    Arthritis    Asthma    Depression    DJD (degenerative joint disease), lumbar    Fructose intolerance    GERD (gastroesophageal reflux disease)    H/O hiatal hernia    Hard of hearing    bilat    Headache    migraines   Heart murmur    echo done 80's neg   History of bronchitis    History of frequent urinary tract infections    History of vertigo    Hyperlipidemia    Hypertension    currently taking no medications    Obesity    Pinched nerve    in back   PONV (postoperative nausea and vomiting)    patch used last time   Pre-diabetes    Restless legs    Seasonal allergies    Sleep apnea    Intolerant to CPAP last sleep study   Tinnitus    Wears glasses    Past Surgical History:  Procedure Laterality Date   ABDOMINAL HYSTERECTOMY  1995   No oophorectomy   BREATH TEK H PYLORI N/A 06/23/2014   Procedure: BREATH TEK H PYLORI;  Surgeon: Camellia Blush, MD;  Location: [facility];  Service: General;  Laterality: N/A;   CHOLECYSTECTOMY N/A 07/12/2015   Procedure: LAPAROSCOPIC CHOLECYSTECTOMY WITH INTRAOPERATIVE CHOLANGIOGRAM;  Surgeon: Camellia Blush, MD;  Location: [facility];  Service: General;  Laterality: N/A;   DIAGNOSTIC LAPAROSCOPY     DILATION AND CURETTAGE OF UTERUS     KNEE SURGERY  04/2009   right knee, meniscus tear   LAPAROSCOPIC GASTRIC SLEEVE RESECTION WITH HIATAL HERNIA REPAIR N/A 10/03/2014   Procedure: LAPAROSCOPIC GASTRIC SLEEVE RESECTION WITH HIATAL HERNIA REPAIR W UPPER  ENDO;  Surgeon: Camellia Blush, MD;  Location: [facility];  Service: General;  Laterality: N/A;   ORIF ANKLE FRACTURE Right 10/17/2013   Procedure: OPEN REDUCTION INTERNAL FIXATION (ORIF) RIGHT ANKLE DISTAL FIBULA/LATERAL MALLEOLUS;  Surgeon: Marcey Raman, MD;  Location: [facility];  Service: Orthopedics;  Laterality: Right;   OTHER SURGICAL HISTORY  1995?   removed scar tissue from colon   thigh tuck Bilateral    inner thigh tuck   TOTAL KNEE ARTHROPLASTY Right 09/15/2023   Procedure: ARTHROPLASTY, KNEE, TOTAL;  Surgeon: Ernie Cough, MD;  Location: [facility];  Service: Orthopedics;  Laterality: Right;   UPPER GI ENDOSCOPY N/A 10/03/2014   Procedure: UPPER GI ENDOSCOPY;  Surgeon: Camellia Blush, MD;  Location: [facility];  Service: General;  Laterality: N/A;   WRIST SURGERY  1980s   cyst removed from right wrist   Patient Active Problem List   Diagnosis Date Noted   S/P total knee arthroplasty, right 09/15/2023   Neck pain 10/26/2020   Sinusitis 02/21/2019   Asthma intermitent mild 02/04/2016   S/P laparoscopic cholecystectomy 07/12/2015   GERD (gastroesophageal reflux disease) 10/03/2014   Hiatal hernia 10/03/2014  S/P laparoscopic sleeve gastrectomy 10/03/2014   Preoperative clearance 05/19/2014   Hyperglycemia 04/06/2014   Hypertension 04/06/2014   Hyperlipidemia 04/06/2014   Headache 08/02/2013   Chronic rhinitis 08/02/2013   Obesity, Class III, BMI 40-49.9 (morbid obesity) 10/06/2012   Encounter for screening for malignant neoplasm of breast 06/15/2012   RLS (restless legs syndrome) 06/15/2012   Depression 06/06/2011   Joint pain 06/06/2011   OSA (obstructive sleep apnea) 06/06/2011   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    PCP: Artist Cohens  REFERRING PROVIDER: Donnice Car  REFERRING DIAG: Rt TKA 09/15/23  THERAPY DIAG:  Status post total right knee replacement  Muscle weakness (generalized)  Other abnormalities of gait and mobility  Rationale for Evaluation and Treatment:  Rehabilitation  ONSET DATE: 09/15/23  SUBJECTIVE:   SUBJECTIVE STATEMENT: It has been painful. I am not taking a lot of pain meds. I am taking half of the hydrocodone  with acetaminophen . I have been taking Tylenol .   PERTINENT HISTORY: R ORIF 2015, R TKA 09/15/23 All other PMH see above  PAIN:  Are you having pain? Yes: NPRS scale: 6-7 Pain location: R knee, R ankle is hurting too  Pain description: sharp, shooting, dull, ache, stabbing Aggravating factors: walking, standing, bending to sit down, getting out of bed  Relieving factors: pain meds, icing   PRECAUTIONS: Knee  RED FLAGS: None   WEIGHT BEARING RESTRICTIONS: No  FALLS:  Has patient fallen in last 6 months? Yes. Number of falls 3  LIVING ENVIRONMENT: Lives with: lives with their family Lives in: House/apartment Stairs: Yes: External: 2 steps; on right going up Has following equipment at home: Single point cane and Walker - 2 wheeled  OCCUPATION: Retired  PLOF: Independent and Independent with basic ADLs  PATIENT GOALS: to get back to normal function   NEXT MD VISIT: 09/30/23  OBJECTIVE:  Note: Objective measures were completed at Evaluation unless otherwise noted.  DIAGNOSTIC FINDINGS: N/A for knee  COGNITION: Overall cognitive status: Within functional limits for tasks assessed     SENSATION: Light touch: Impaired  and numb around knee  EDEMA:  Increased edema around R knee   POSTURE: rounded shoulders and flexed trunk   PALPATION: Warm and TTP around R knee, numbness along lateral knee  LOWER EXTREMITY ROM:  Active ROM Right eval Left eval  Hip flexion    Hip extension    Hip abduction    Hip adduction    Hip internal rotation    Hip external rotation    Knee flexion 82   Knee extension -20   Ankle dorsiflexion    Ankle plantarflexion    Ankle inversion    Ankle eversion     (Blank rows = not tested)  LOWER EXTREMITY MMT:  MMT Right eval Left eval  Hip flexion 3+   Hip  extension    Hip abduction    Hip adduction    Hip internal rotation    Hip external rotation    Knee flexion 2+   Knee extension 2+   Ankle dorsiflexion    Ankle plantarflexion    Ankle inversion    Ankle eversion     (Blank rows = not tested)  FUNCTIONAL TESTS:  5 times sit to stand: 18s from elevated mat table, compensates  Timed up and go (TUG): 29s   GAIT: Distance walked: in clinic distances Assistive device utilized: Environmental consultant - 2 wheeled Level of assistance: Modified independence Comments: increased fatigue, decrease stance time and step length with RLE, antalgic gait,  very flexed walker reports she has some back issues and a pinched nerve                                                                                                                                TREATMENT DATE: 09/24/23 EVAL    PATIENT EDUCATION:  Education details: POC, HEP, importance of icing and elevating Person educated: Patient Education method: Medical illustrator Education comprehension: verbalized understanding  HOME EXERCISE PROGRAM: Access Code: [email or web address] URL: [email or web address] Date: 09/24/2023 Prepared by: Almetta Fam  Exercises - Seated Long Arc Quad  - 1 x daily - 7 x weekly - 2 sets - 10 reps - Sit to Stand  - 1 x daily - 7 x weekly - 2 sets - 10 reps - Supine Quad Set  - 1 x daily - 7 x weekly - 2 sets - 10 reps - 3 hold - Seated Knee Flexion Slide  - 1 x daily - 7 x weekly - 2 sets - 10 reps - 3 hold - Supine Heel Slide with Strap  - 1 x daily - 7 x weekly - 2 sets - 10 reps - 3 hold  ASSESSMENT:  CLINICAL IMPRESSION: Patient is a 68 y.o. female who was seen today for physical therapy evaluation and treatment for R TKA done on 09/15/23. She is walking with very flexed and antalgic gait over the walker. She needs a standing rest break walking from the lobby to treatment room. Pt reports some ongoing back issues which is why she is flexed at the trunk and  uses the walker for support. Prior to surgery, she did not use an AD. She presents with increased swelling, pain, and gait impairments at evaluation. Patient will benefit from skilled PT to address her R knee ROM and strength deficits to be able to normalize her gait pattern and return to PLOF. She does have transportation limits until she is able to drive on her own again.   OBJECTIVE IMPAIRMENTS: Abnormal gait, decreased activity tolerance, decreased balance, decreased endurance, difficulty walking, decreased ROM, decreased strength, obesity, and pain.   ACTIVITY LIMITATIONS: carrying, lifting, bending, sitting, standing, squatting, sleeping, stairs, transfers, toileting, and locomotion level  PARTICIPATION LIMITATIONS: meal prep, cleaning, laundry, driving, shopping, and community activity  PERSONAL FACTORS: Fitness and Transportation are also affecting patient's functional outcome.   REHAB POTENTIAL: Good  CLINICAL DECISION MAKING: Stable/uncomplicated  EVALUATION COMPLEXITY: Low  GOALS: Goals reviewed with patient? Yes   SHORT TERM GOALS: Target date: 11/05/23  Independent with initial HEP. Baseline:  Goal status: INITIAL  2.  Patient will demonstrate improved functional LE strength by completing 5x STS in <15 seconds.   Baseline: 18s with compensation, high mat table Goal status: INITIAL   LONG TERM GOALS: Target date: 12/17/23  Independent with advanced/ongoing HEP to improve outcomes and carryover.  Baseline:  Goal status: INITIAL  2.  Patient will demonstrate R knee flexion to 120 deg to  ascend/descend stairs. Baseline: 82 Goal status: INITIAL  3.  Patient will demonstrate full R knee extension for safety with gait. Baseline: -20 Goal status: INITIAL  4.  Patient will be able to ambulate 600' safely with LRAD and normal gait pattern to access community.  Baseline: using RW, very antalgic Goal status: INITIAL  5.  Patient will be able to ascend/descend stairs  with 1 HR and reciprocal step pattern safely to access home and community.  Baseline:  Goal status: INITIAL   PLAN:  PT FREQUENCY: 1-2x/week  PT DURATION: 12 weeks  PLANNED INTERVENTIONS: 97110-Therapeutic exercises, 97530- Therapeutic activity, [id number]- Neuromuscular re-education, 97535- Self Care, [id number]- Manual therapy, [phone]- Gait training, [phone]- Vasopneumatic device, Patient/Family education, Balance training, Stair training, Joint mobilization, Cryotherapy, and Moist heat  PLAN FOR NEXT SESSION: PROM, AAROM, light strengthening, vaso if she needs    Almetta Fam, PT 09/24/2023, 12:29 PM

## 2023-09-24 ENCOUNTER — Ambulatory Visit: Admitting: Physical Therapy

## 2023-09-24 ENCOUNTER — Ambulatory Visit: Attending: Orthopedic Surgery

## 2023-09-24 DIAGNOSIS — R2689 Other abnormalities of gait and mobility: Secondary | ICD-10-CM | POA: Insufficient documentation

## 2023-09-24 DIAGNOSIS — M6281 Muscle weakness (generalized): Secondary | ICD-10-CM | POA: Insufficient documentation

## 2023-09-24 DIAGNOSIS — Z96651 Presence of right artificial knee joint: Secondary | ICD-10-CM | POA: Diagnosis present

## 2023-09-27 NOTE — Discharge Summary (Signed)
 Patient ID: Taylor Silva MRN: [id number] DOB/AGE: 06-16-1955 68 y.o.  Admit date: 09/15/2023 Discharge date: 09/16/2023  Admission Diagnoses:  Right knee osteoarthritis  Discharge Diagnoses:  Principal Problem:   S/P total knee arthroplasty, right   Past Medical History:  Diagnosis Date   Anxiety    Arthritis    Asthma    Depression    DJD (degenerative joint disease), lumbar    Fructose intolerance    GERD (gastroesophageal reflux disease)    H/O hiatal hernia    Hard of hearing    bilat    Headache    migraines   Heart murmur    echo done 80's neg   History of bronchitis    History of frequent urinary tract infections    History of vertigo    Hyperlipidemia    Hypertension    currently taking no medications    Obesity    Pinched nerve    in back   PONV (postoperative nausea and vomiting)    patch used last time   Pre-diabetes    Restless legs    Seasonal allergies    Sleep apnea    Intolerant to CPAP last sleep study   Tinnitus    Wears glasses     Surgeries: Procedure(s): ARTHROPLASTY, KNEE, TOTAL on 09/15/2023   Consultants:   Discharged Condition: Improved  Hospital Course: Taylor Silva is an 68 y.o. female who was admitted 09/15/2023 for operative treatment ofS/P total knee arthroplasty, right. Patient has severe unremitting pain that affects sleep, daily activities, and work/hobbies. After pre-op clearance the patient was taken to the operating room on 09/15/2023 and underwent  Procedure(s): ARTHROPLASTY, KNEE, TOTAL.    Patient was given perioperative antibiotics:  Anti-infectives (From admission, onward)    Start     Dose/Rate Route Frequency Ordered Stop   09/16/23 0000  cefadroxil  (DURICEF) 500 MG capsule        500 mg Oral 2 times daily 09/16/23 0823 09/23/23 2359   09/15/23 2000  ceFAZolin  (ANCEF ) IVPB 2g/100 mL premix        2 g 200 mL/hr over 30 Minutes Intravenous Every 6 hours 09/15/23 1757 09/16/23 0232   09/15/23 1200  ceFAZolin   (ANCEF ) IVPB 2g/100 mL premix        2 g 200 mL/hr over 30 Minutes Intravenous On call to O.R. 09/15/23 1148 09/15/23 1410        Patient was given sequential compression devices, early ambulation, and chemoprophylaxis to prevent DVT. Patient worked with PT and was meeting their goals regarding safe ambulation and transfers.  Patient benefited maximally from hospital stay and there were no complications.    Recent vital signs: No data found.   Recent laboratory studies: No results for input(s): WBC, HGB, HCT, PLT, NA, K, CL, CO2, BUN, CREATININE, GLUCOSE, INR, CALCIUM  in the last 72 hours.  Invalid input(s): PT, 2   Discharge Medications:   Allergies as of 09/16/2023       Reactions   Codeine Anaphylaxis   Erythromycin Hives   Tetracyclines & Related Hives   Darvon [propoxyphene] Photosensitivity   Blurry vision for 3 days   Morphine And Codeine Dermatitis   burning in veins   Other Other (See Comments)   Tomatoes upset stomach, itchy   Penicillins Hives   Has patient had a PCN reaction causing immediate rash, facial/tongue/throat swelling, SOB or lightheadedness with hypotension: Yes Has patient had a PCN reaction causing severe rash involving mucus membranes or skin necrosis:  No Has patient had a PCN reaction that required hospitalization No Has patient had a PCN reaction occurring within the last 10 years: No If all of the above answers are NO, then may proceed with Cephalosporin use. Tolerated Cephalosporin Date: 09/16/23.   Sulfa Antibiotics Nausea And Vomiting        Medication List     STOP taking these medications    ACETAMINOPHEN -BUTALBITAL  50-325 MG Tabs   Azelastine  HCl 137 MCG/SPRAY Soln   lidocaine  5 % Commonly known as: Lidoderm    propranolol  20 MG tablet Commonly known as: INDERAL    tiZANidine  2 MG tablet Commonly known as: ZANAFLEX        TAKE these medications    acetaminophen  500 MG tablet Commonly  known as: TYLENOL  Take 2 tablets (1,000 mg total) by mouth every 6 (six) hours.   Albuterol  Sulfate 108 (90 Base) MCG/ACT Aepb Commonly known as: ProAir  RespiClick Inhale 2 puffs into the lungs 4 (four) times daily as needed.   Aspirin  Low Dose 81 MG chewable tablet Generic drug: aspirin  Chew 1 tablet (81 mg total) by mouth 2 (two) times daily for 28 days.   CALCIUM  CITRATE+D3 PETITES PO Take 4 tablets by mouth daily.   FLINTSTONES COMPLETE PO Take 2 tablets by mouth daily.   FLUoxetine  20 MG capsule Commonly known as: PROZAC  Take 20 mg by mouth daily.   FLUoxetine  40 MG capsule Commonly known as: PROzac  Take 1 capsule (40 mg total) by mouth daily.   gabapentin  600 MG tablet Commonly known as: NEURONTIN  TAKE 1 TABLET BY MOUTH THREE TIMES DAILY   hydrOXYzine  25 MG tablet Commonly known as: ATARAX  TAKE ONE TO TWO TABLETS BY MOUTH DURING THE DAY AND ONE TO TWO TABLETS AT BEDTIME What changed:  how much to take how to take this when to take this reasons to take this additional instructions   Krill Oil 500 MG Caps Take 500 mg by mouth daily.   methocarbamol  500 MG tablet Commonly known as: ROBAXIN  Take 1 tablet (500 mg total) by mouth every 6 (six) hours as needed for muscle spasms. What changed:  when to take this reasons to take this   montelukast  10 MG tablet Commonly known as: SINGULAIR  Take 1 tablet (10 mg total) by mouth at bedtime as needed.   polyethylene glycol powder 17 GM/SCOOP powder Commonly known as: GLYCOLAX /MIRALAX  Take 17 g dissolved in liquid by mouth 2 (two) times daily.   rosuvastatin  40 MG tablet Commonly known as: CRESTOR  Take 40 mg by mouth daily.   senna 8.6 MG Tabs tablet Commonly known as: SENOKOT Take 2 tablets (17.2 mg total) by mouth at bedtime for 14 days.   traMADol  50 MG tablet Commonly known as: ULTRAM  Take 1 tablet (50 mg total) by mouth every 6 (six) hours as needed for moderate pain (pain score 4-6) or severe pain (pain  score 7-10).       ASK your doctor about these medications    cefadroxil  500 MG capsule Commonly known as: DURICEF Take 1 capsule (500 mg total) by mouth 2 (two) times daily for 7 days. Ask about: Should I take this medication?               Discharge Care Instructions  (From admission, onward)           Start     Ordered   09/16/23 0000  Change dressing       Comments: Maintain surgical dressing until follow up in the clinic. If the  edges start to pull up, may reinforce with tape. If the dressing is no longer working, may remove and cover with gauze and tape, but must keep the area dry and clean.  Call with any questions or concerns.   09/16/23 0823            Diagnostic Studies: No results found.  Disposition: Discharge disposition: 01-Home or Self Care       Discharge Instructions     Call MD / Call 911   Complete by: As directed    If you experience chest pain or shortness of breath, CALL 911 and be transported to the hospital emergency room.  If you develope a fever above 101 F, pus (white drainage) or increased drainage or redness at the wound, or calf pain, call your surgeon's office.   Change dressing   Complete by: As directed    Maintain surgical dressing until follow up in the clinic. If the edges start to pull up, may reinforce with tape. If the dressing is no longer working, may remove and cover with gauze and tape, but must keep the area dry and clean.  Call with any questions or concerns.   Constipation Prevention   Complete by: As directed    Drink plenty of fluids.  Prune juice may be helpful.  You may use a stool softener, such as Colace (over the counter) 100 mg twice a day.  Use MiraLax  (over the counter) for constipation as needed.   Diet - low sodium heart healthy   Complete by: As directed    Increase activity slowly as tolerated   Complete by: As directed    Weight bearing as tolerated with assist device (walker, cane, etc) as  directed, use it as long as suggested by your surgeon or therapist, typically at least 4-6 weeks.   Post-operative opioid taper instructions:   Complete by: As directed    POST-OPERATIVE OPIOID TAPER INSTRUCTIONS: It is important to wean off of your opioid medication as soon as possible. If you do not need pain medication after your surgery it is ok to stop day one. Opioids include: Codeine, Hydrocodone (Norco, Vicodin), Oxycodone (Percocet, oxycontin ) and hydromorphone  amongst others.  Long term and even short term use of opiods can cause: Increased pain response Dependence Constipation Depression Respiratory depression And more.  Withdrawal symptoms can include Flu like symptoms Nausea, vomiting And more Techniques to manage these symptoms Hydrate well Eat regular healthy meals Stay active Use relaxation techniques(deep breathing, meditating, yoga) Do Not substitute Alcohol to help with tapering If you have been on opioids for less than two weeks and do not have pain than it is ok to stop all together.  Plan to wean off of opioids This plan should start within one week post op of your joint replacement. Maintain the same interval or time between taking each dose and first decrease the dose.  Cut the total daily intake of opioids by one tablet each day Next start to increase the time between doses. The last dose that should be eliminated is the evening dose.      TED hose   Complete by: As directed    Use stockings (TED hose) for 2 weeks on both leg(s).  You may remove them at night for sleeping.        Follow-up Information     Patti Rosina SAUNDERS, PA-C. Go on 09/30/2023.   Specialty: Orthopedic Surgery Why: You are scheduled for a post op appointment on Wednesday 09/30/23 at 3:15pm  Contact information: [address] [place] [address] [phone]                  Signed: Rosina JONELLE Calin 09/27/2023, 9:00 AM

## 2023-09-29 ENCOUNTER — Ambulatory Visit: Admitting: Physical Therapy

## 2023-09-29 DIAGNOSIS — Z96651 Presence of right artificial knee joint: Secondary | ICD-10-CM | POA: Diagnosis not present

## 2023-09-29 DIAGNOSIS — M6281 Muscle weakness (generalized): Secondary | ICD-10-CM

## 2023-09-29 NOTE — Therapy (Signed)
 OUTPATIENT PHYSICAL THERAPY LOWER EXTREMITY    Patient Name: Taylor Silva MRN: [id number] DOB:10/22/1955, 68 y.o., female Today's Date: 09/29/2023  END OF SESSION:  PT End of Session - 09/29/23 1031     Visit Number 2    Date for Recertification  12/17/23    Authorization Type Medicare    PT Start Time 1015    PT Stop Time 1100    PT Time Calculation (min) 45 min          Past Medical History:  Diagnosis Date   Anxiety    Arthritis    Asthma    Depression    DJD (degenerative joint disease), lumbar    Fructose intolerance    GERD (gastroesophageal reflux disease)    H/O hiatal hernia    Hard of hearing    bilat    Headache    migraines   Heart murmur    echo done 80's neg   History of bronchitis    History of frequent urinary tract infections    History of vertigo    Hyperlipidemia    Hypertension    currently taking no medications    Obesity    Pinched nerve    in back   PONV (postoperative nausea and vomiting)    patch used last time   Pre-diabetes    Restless legs    Seasonal allergies    Sleep apnea    Intolerant to CPAP last sleep study   Tinnitus    Wears glasses    Past Surgical History:  Procedure Laterality Date   ABDOMINAL HYSTERECTOMY  1995   No oophorectomy   BREATH TEK H PYLORI N/A 06/23/2014   Procedure: BREATH TEK H PYLORI;  Surgeon: Camellia Blush, MD;  Location: [facility];  Service: General;  Laterality: N/A;   CHOLECYSTECTOMY N/A 07/12/2015   Procedure: LAPAROSCOPIC CHOLECYSTECTOMY WITH INTRAOPERATIVE CHOLANGIOGRAM;  Surgeon: Camellia Blush, MD;  Location: [facility];  Service: General;  Laterality: N/A;   DIAGNOSTIC LAPAROSCOPY     DILATION AND CURETTAGE OF UTERUS     KNEE SURGERY  04/2009   right knee, meniscus tear   LAPAROSCOPIC GASTRIC SLEEVE RESECTION WITH HIATAL HERNIA REPAIR N/A 10/03/2014   Procedure: LAPAROSCOPIC GASTRIC SLEEVE RESECTION WITH HIATAL HERNIA REPAIR W UPPER ENDO;  Surgeon: Camellia Blush, MD;  Location: [facility];   Service: General;  Laterality: N/A;   ORIF ANKLE FRACTURE Right 10/17/2013   Procedure: OPEN REDUCTION INTERNAL FIXATION (ORIF) RIGHT ANKLE DISTAL FIBULA/LATERAL MALLEOLUS;  Surgeon: Marcey Raman, MD;  Location: [facility];  Service: Orthopedics;  Laterality: Right;   OTHER SURGICAL HISTORY  1995?   removed scar tissue from colon   thigh tuck Bilateral    inner thigh tuck   TOTAL KNEE ARTHROPLASTY Right 09/15/2023   Procedure: ARTHROPLASTY, KNEE, TOTAL;  Surgeon: Ernie Cough, MD;  Location: [facility];  Service: Orthopedics;  Laterality: Right;   UPPER GI ENDOSCOPY N/A 10/03/2014   Procedure: UPPER GI ENDOSCOPY;  Surgeon: Camellia Blush, MD;  Location: [facility];  Service: General;  Laterality: N/A;   WRIST SURGERY  1980s   cyst removed from right wrist   Patient Active Problem List   Diagnosis Date Noted   S/P total knee arthroplasty, right 09/15/2023   Neck pain 10/26/2020   Sinusitis 02/21/2019   Asthma intermitent mild 02/04/2016   S/P laparoscopic cholecystectomy 07/12/2015   GERD (gastroesophageal reflux disease) 10/03/2014   Hiatal hernia 10/03/2014   S/P laparoscopic sleeve gastrectomy 10/03/2014   Preoperative clearance  05/19/2014   Hyperglycemia 04/06/2014   Hypertension 04/06/2014   Hyperlipidemia 04/06/2014   Headache 08/02/2013   Chronic rhinitis 08/02/2013   Obesity, Class III, BMI 40-49.9 (morbid obesity) 10/06/2012   Encounter for screening for malignant neoplasm of breast 06/15/2012   RLS (restless legs syndrome) 06/15/2012   Depression 06/06/2011   Joint pain 06/06/2011   OSA (obstructive sleep apnea) 06/06/2011   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    PCP: Artist Cohens  REFERRING PROVIDER: Donnice Car  REFERRING DIAG: Rt TKA 09/15/23  THERAPY DIAG:  Status post total right knee replacement  Muscle weakness (generalized)  Rationale for Evaluation and Treatment: Rehabilitation  ONSET DATE: 09/15/23  SUBJECTIVE:   SUBJECTIVE STATEMENT: This has been awful. Seeing MD  tomorrow. Pt verb doing HEP PERTINENT HISTORY: R ORIF 2015, R TKA 09/15/23 All other PMH see above  PAIN:  Are you having pain? Yes: NPRS scale: 5-6 Pain location: R knee, R ankle is hurting too  Pain description: sharp, shooting, dull, ache, stabbing Aggravating factors: walking, standing, bending to sit down, getting out of bed  Relieving factors: pain meds, icing   PRECAUTIONS: Knee  RED FLAGS: None   WEIGHT BEARING RESTRICTIONS: No  FALLS:  Has patient fallen in last 6 months? Yes. Number of falls 3  LIVING ENVIRONMENT: Lives with: lives with their family Lives in: House/apartment Stairs: Yes: External: 2 steps; on right going up Has following equipment at home: Single point cane and Walker - 2 wheeled  OCCUPATION: Retired  PLOF: Independent and Independent with basic ADLs  PATIENT GOALS: to get back to normal function   NEXT MD VISIT: 09/30/23  OBJECTIVE:  Note: Objective measures were completed at Evaluation unless otherwise noted.  DIAGNOSTIC FINDINGS: N/A for knee  COGNITION: Overall cognitive status: Within functional limits for tasks assessed     SENSATION: Light touch: Impaired  and numb around knee  EDEMA:  Increased edema around R knee   POSTURE: rounded shoulders and flexed trunk   PALPATION: Warm and TTP around R knee, numbness along lateral knee  LOWER EXTREMITY ROM:  Active ROM Right eval Left eval 09/29/23  Hip flexion     Hip extension     Hip abduction     Hip adduction     Hip internal rotation     Hip external rotation     Knee flexion 82  92  Knee extension -20  5  Ankle dorsiflexion     Ankle plantarflexion     Ankle inversion     Ankle eversion      (Blank rows = not tested)  LOWER EXTREMITY MMT:  MMT Right eval Left eval  Hip flexion 3+   Hip extension    Hip abduction    Hip adduction    Hip internal rotation    Hip external rotation    Knee flexion 2+   Knee extension 2+   Ankle dorsiflexion    Ankle  plantarflexion    Ankle inversion    Ankle eversion     (Blank rows = not tested)  FUNCTIONAL TESTS:  5 times sit to stand: 18s from elevated mat table, compensates  Timed up and go (TUG): 29s   GAIT: Distance walked: in clinic distances Assistive device utilized: Environmental consultant - 2 wheeled Level of assistance: Modified independence Comments: increased fatigue, decrease stance time and step length with RLE, antalgic gait, very flexed walker reports she has some back issues and a pinched nerve  TREATMENT DATE:  09/29/23 PROM flex and ext AROM 5-92 LAQ 2 sets 10 Red tband HS curl 2 sets 10s Fitter 1 blue flex and ext 2 sets 10 Nustep L 4 Amb with SPC sup with cuing VASO low 10 min     09/24/23 EVAL    PATIENT EDUCATION:  Education details: POC, HEP, importance of icing and elevating Person educated: Patient Education method: Medical illustrator Education comprehension: verbalized understanding  HOME EXERCISE PROGRAM: Access Code: [email or web address] URL: [email or web address] Date: 09/24/2023 Prepared by: Almetta Fam  Exercises - Seated Long Arc Quad  - 1 x daily - 7 x weekly - 2 sets - 10 reps - Sit to Stand  - 1 x daily - 7 x weekly - 2 sets - 10 reps - Supine Quad Set  - 1 x daily - 7 x weekly - 2 sets - 10 reps - 3 hold - Seated Knee Flexion Slide  - 1 x daily - 7 x weekly - 2 sets - 10 reps - 3 hold - Supine Heel Slide with Strap  - 1 x daily - 7 x weekly - 2 sets - 10 reps - 3 hold  ASSESSMENT:  CLINICAL IMPRESSION: STG met- verb doing initial HEP. AROM improved. Progressed ex for ROM and strength, adjusted height of walker up as it was too llow. Initiated some gait with SPC and cautioned pt about overdoing and if she tries at home do after she has been up and moving and not at night as she is fatigued     Patient is a 68  y.o. female who was seen today for physical therapy evaluation and treatment for R TKA done on 09/15/23. She is walking with very flexed and antalgic gait over the walker. She needs a standing rest break walking from the lobby to treatment room. Pt reports some ongoing back issues which is why she is flexed at the trunk and uses the walker for support. Prior to surgery, she did not use an AD. She presents with increased swelling, pain, and gait impairments at evaluation. Patient will benefit from skilled PT to address her R knee ROM and strength deficits to be able to normalize her gait pattern and return to PLOF. She does have transportation limits until she is able to drive on her own again.   OBJECTIVE IMPAIRMENTS: Abnormal gait, decreased activity tolerance, decreased balance, decreased endurance, difficulty walking, decreased ROM, decreased strength, obesity, and pain.   ACTIVITY LIMITATIONS: carrying, lifting, bending, sitting, standing, squatting, sleeping, stairs, transfers, toileting, and locomotion level  PARTICIPATION LIMITATIONS: meal prep, cleaning, laundry, driving, shopping, and community activity  PERSONAL FACTORS: Fitness and Transportation are also affecting patient's functional outcome.   REHAB POTENTIAL: Good  CLINICAL DECISION MAKING: Stable/uncomplicated  EVALUATION COMPLEXITY: Low  GOALS: Goals reviewed with patient? Yes   SHORT TERM GOALS: Target date: 11/05/23  Independent with initial HEP. Baseline:  Goal status: 09/29/23 MET  2.  Patient will demonstrate improved functional LE strength by completing 5x STS in <15 seconds.   Baseline: 18s with compensation, high mat table Goal status: INITIAL   LONG TERM GOALS: Target date: 12/17/23  Independent with advanced/ongoing HEP to improve outcomes and carryover.  Baseline:  Goal status: INITIAL  2.  Patient will demonstrate R knee flexion to 120 deg to ascend/descend stairs. Baseline: 82 Goal status:  INITIAL  3.  Patient will demonstrate full R knee extension for safety with gait. Baseline: -20 Goal status: INITIAL  4.  Patient will be  able to ambulate 600' safely with LRAD and normal gait pattern to access community.  Baseline: using RW, very antalgic Goal status: INITIAL  5.  Patient will be able to ascend/descend stairs with 1 HR and reciprocal step pattern safely to access home and community.  Baseline:  Goal status: INITIAL   PLAN:  PT FREQUENCY: 1-2x/week  PT DURATION: 12 weeks  PLANNED INTERVENTIONS: 97110-Therapeutic exercises, 97530- Therapeutic activity, [id number]- Neuromuscular re-education, 97535- Self Care, [id number]- Manual therapy, [phone]- Gait training, [phone]- Vasopneumatic device, Patient/Family education, Balance training, Stair training, Joint mobilization, Cryotherapy, and Moist heat  PLAN FOR NEXT SESSION: PROM, AROM, light strengthening, vaso if she needs    Milda Lindvall,ANGIE, PTA 09/29/2023, 10:31 AM

## 2023-10-06 ENCOUNTER — Ambulatory Visit: Admitting: Physical Therapy

## 2023-10-06 ENCOUNTER — Encounter: Payer: Self-pay | Admitting: Physical Therapy

## 2023-10-06 DIAGNOSIS — Z96651 Presence of right artificial knee joint: Secondary | ICD-10-CM

## 2023-10-06 DIAGNOSIS — M6281 Muscle weakness (generalized): Secondary | ICD-10-CM

## 2023-10-06 NOTE — Therapy (Addendum)
 OUTPATIENT PHYSICAL THERAPY LOWER EXTREMITY    Patient Name: Taylor Silva MRN: [id number] DOB:1955-08-04, 68 y.o., female Today's Date: 10/06/2023  END OF SESSION:  PT End of Session - 10/06/23 1427     Visit Number 3    Date for Recertification  12/17/23    PT Start Time 1430    PT Stop Time 1515    PT Time Calculation (min) 45 min    Activity Tolerance Patient limited by pain    Behavior During Therapy WFL for tasks assessed/performed          Past Medical History:  Diagnosis Date   Anxiety    Arthritis    Asthma    Depression    DJD (degenerative joint disease), lumbar    Fructose intolerance    GERD (gastroesophageal reflux disease)    H/O hiatal hernia    Hard of hearing    bilat    Headache    migraines   Heart murmur    echo done 80's neg   History of bronchitis    History of frequent urinary tract infections    History of vertigo    Hyperlipidemia    Hypertension    currently taking no medications    Obesity    Pinched nerve    in back   PONV (postoperative nausea and vomiting)    patch used last time   Pre-diabetes    Restless legs    Seasonal allergies    Sleep apnea    Intolerant to CPAP last sleep study   Tinnitus    Wears glasses    Past Surgical History:  Procedure Laterality Date   ABDOMINAL HYSTERECTOMY  1995   No oophorectomy   BREATH TEK H PYLORI N/A 06/23/2014   Procedure: BREATH TEK H PYLORI;  Surgeon: Camellia Blush, MD;  Location: [facility];  Service: General;  Laterality: N/A;   CHOLECYSTECTOMY N/A 07/12/2015   Procedure: LAPAROSCOPIC CHOLECYSTECTOMY WITH INTRAOPERATIVE CHOLANGIOGRAM;  Surgeon: Camellia Blush, MD;  Location: [facility];  Service: General;  Laterality: N/A;   DIAGNOSTIC LAPAROSCOPY     DILATION AND CURETTAGE OF UTERUS     KNEE SURGERY  04/2009   right knee, meniscus tear   LAPAROSCOPIC GASTRIC SLEEVE RESECTION WITH HIATAL HERNIA REPAIR N/A 10/03/2014   Procedure: LAPAROSCOPIC GASTRIC SLEEVE RESECTION WITH HIATAL  HERNIA REPAIR W UPPER ENDO;  Surgeon: Camellia Blush, MD;  Location: [facility];  Service: General;  Laterality: N/A;   ORIF ANKLE FRACTURE Right 10/17/2013   Procedure: OPEN REDUCTION INTERNAL FIXATION (ORIF) RIGHT ANKLE DISTAL FIBULA/LATERAL MALLEOLUS;  Surgeon: Marcey Raman, MD;  Location: [facility];  Service: Orthopedics;  Laterality: Right;   OTHER SURGICAL HISTORY  1995?   removed scar tissue from colon   thigh tuck Bilateral    inner thigh tuck   TOTAL KNEE ARTHROPLASTY Right 09/15/2023   Procedure: ARTHROPLASTY, KNEE, TOTAL;  Surgeon: Ernie Cough, MD;  Location: [facility];  Service: Orthopedics;  Laterality: Right;   UPPER GI ENDOSCOPY N/A 10/03/2014   Procedure: UPPER GI ENDOSCOPY;  Surgeon: Camellia Blush, MD;  Location: [facility];  Service: General;  Laterality: N/A;   WRIST SURGERY  1980s   cyst removed from right wrist   Patient Active Problem List   Diagnosis Date Noted   S/P total knee arthroplasty, right 09/15/2023   Neck pain 10/26/2020   Sinusitis 02/21/2019   Asthma intermitent mild 02/04/2016   S/P laparoscopic cholecystectomy 07/12/2015   GERD (gastroesophageal reflux disease) 10/03/2014   Hiatal  hernia 10/03/2014   S/P laparoscopic sleeve gastrectomy 10/03/2014   Preoperative clearance 05/19/2014   Hyperglycemia 04/06/2014   Hypertension 04/06/2014   Hyperlipidemia 04/06/2014   Headache 08/02/2013   Chronic rhinitis 08/02/2013   Obesity, Class III, BMI 40-49.9 (morbid obesity) 10/06/2012   Encounter for screening for malignant neoplasm of breast 06/15/2012   RLS (restless legs syndrome) 06/15/2012   Depression 06/06/2011   Joint pain 06/06/2011   OSA (obstructive sleep apnea) 06/06/2011   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    PCP: Artist Cohens  REFERRING PROVIDER: Donnice Car  REFERRING DIAG: Rt TKA 09/15/23  THERAPY DIAG:  Status post total right knee replacement  Muscle weakness (generalized)  Rationale for Evaluation and Treatment: Rehabilitation  ONSET DATE:  09/15/23  SUBJECTIVE:   SUBJECTIVE STATEMENT: Take it easy on me today feeling shaky.  PERTINENT HISTORY: R ORIF 2015, R TKA 09/15/23 All other PMH see above  PAIN:  Are you having pain? Yes: NPRS scale: 8/10 Pain location: R knee, R ankle is hurting too  Pain description: sharp, shooting, dull, ache, stabbing Aggravating factors: walking, standing, bending to sit down, getting out of bed  Relieving factors: pain meds, icing   PRECAUTIONS: Knee  RED FLAGS: None   WEIGHT BEARING RESTRICTIONS: No  FALLS:  Has patient fallen in last 6 months? Yes. Number of falls 3  LIVING ENVIRONMENT: Lives with: lives with their family Lives in: House/apartment Stairs: Yes: External: 2 steps; on right going up Has following equipment at home: Single point cane and Walker - 2 wheeled  OCCUPATION: Retired  PLOF: Independent and Independent with basic ADLs  PATIENT GOALS: to get back to normal function   NEXT MD VISIT: 09/30/23  OBJECTIVE:  Note: Objective measures were completed at Evaluation unless otherwise noted.  DIAGNOSTIC FINDINGS: N/A for knee  COGNITION: Overall cognitive status: Within functional limits for tasks assessed     SENSATION: Light touch: Impaired  and numb around knee  EDEMA:  Increased edema around R knee   POSTURE: rounded shoulders and flexed trunk   PALPATION: Warm and TTP around R knee, numbness along lateral knee  LOWER EXTREMITY ROM:  Active ROM Right eval Left eval 09/29/23  Hip flexion     Hip extension     Hip abduction     Hip adduction     Hip internal rotation     Hip external rotation     Knee flexion 82  92  Knee extension -20  5  Ankle dorsiflexion     Ankle plantarflexion     Ankle inversion     Ankle eversion      (Blank rows = not tested)  LOWER EXTREMITY MMT:  MMT Right eval Left eval  Hip flexion 3+   Hip extension    Hip abduction    Hip adduction    Hip internal rotation    Hip external rotation    Knee  flexion 2+   Knee extension 2+   Ankle dorsiflexion    Ankle plantarflexion    Ankle inversion    Ankle eversion     (Blank rows = not tested)  FUNCTIONAL TESTS:  5 times sit to stand: 18s from elevated mat table, compensates  Timed up and go (TUG): 29s   GAIT: Distance walked: in clinic distances Assistive device utilized: Environmental consultant - 2 wheeled Level of assistance: Modified independence Comments: increased fatigue, decrease stance time and step length with RLE, antalgic gait, very flexed walker reports she has some back issues and  a pinched nerve                                                                                                                                TREATMENT DATE: 10/06/23 PROM flex and ext with end range holds  Patellar mobs NuStep L 5 x 6 min LAQ 2 sets 15 Red tband HS curl 2 sets 10s Sit to stands 3x5 Standing marches 2x10  09/29/23 PROM flex and ext AROM 5-92 LAQ 2 sets 10 Red tband HS curl 2 sets 10s Fitter 1 blue flex and ext 2 sets 10 Nustep L 4 Amb with SPC sup with cuing VASO low 10 min     09/24/23 EVAL    PATIENT EDUCATION:  Education details: POC, HEP, importance of icing and elevating Person educated: Patient Education method: Medical illustrator Education comprehension: verbalized understanding  HOME EXERCISE PROGRAM: Access Code: [email or web address] URL: [email or web address] Date: 09/24/2023 Prepared by: Almetta Fam  Exercises - Seated Long Arc Quad  - 1 x daily - 7 x weekly - 2 sets - 10 reps - Sit to Stand  - 1 x daily - 7 x weekly - 2 sets - 10 reps - Supine Quad Set  - 1 x daily - 7 x weekly - 2 sets - 10 reps - 3 hold - Seated Knee Flexion Slide  - 1 x daily - 7 x weekly - 2 sets - 10 reps - 3 hold - Supine Heel Slide with Strap  - 1 x daily - 7 x weekly - 2 sets - 10 reps - 3 hold  ASSESSMENT:  CLINICAL IMPRESSION: Lighter session upon Pt's request. She reports being shaky with possible low  blood sugar.  Therapeutic ex for ROM and strength.  Some end range pain with passive flexion. Pt also with soe sensitivity with patellar mobs.  Initiated some marching activities without AD or UE support. Cue needed to increase bilat hip and knee flexion with marches.    Patient is a 68 y.o. female who was seen today for physical therapy evaluation and treatment for R TKA done on 09/15/23. She is walking with very flexed and antalgic gait over the walker. She needs a standing rest break walking from the lobby to treatment room. Pt reports some ongoing back issues which is why she is flexed at the trunk and uses the walker for support. Prior to surgery, she did not use an AD. She presents with increased swelling, pain, and gait impairments at evaluation. Patient will benefit from skilled PT to address her R knee ROM and strength deficits to be able to normalize her gait pattern and return to PLOF. She does have transportation limits until she is able to drive on her own again.   OBJECTIVE IMPAIRMENTS: Abnormal gait, decreased activity tolerance, decreased balance, decreased endurance, difficulty walking, decreased ROM, decreased strength, obesity, and pain.   ACTIVITY LIMITATIONS: carrying, lifting, bending, sitting, standing, squatting, sleeping, stairs, transfers,  toileting, and locomotion level  PARTICIPATION LIMITATIONS: meal prep, cleaning, laundry, driving, shopping, and community activity  PERSONAL FACTORS: Fitness and Transportation are also affecting patient's functional outcome.   REHAB POTENTIAL: Good  CLINICAL DECISION MAKING: Stable/uncomplicated  EVALUATION COMPLEXITY: Low  GOALS: Goals reviewed with patient? Yes   SHORT TERM GOALS: Target date: 11/05/23  Independent with initial HEP. Baseline:  Goal status: 09/29/23 MET  2.  Patient will demonstrate improved functional LE strength by completing 5x STS in <15 seconds.   Baseline: 18s with compensation, high mat table Goal  status: INITIAL   LONG TERM GOALS: Target date: 12/17/23  Independent with advanced/ongoing HEP to improve outcomes and carryover.  Baseline:  Goal status: INITIAL  2.  Patient will demonstrate R knee flexion to 120 deg to ascend/descend stairs. Baseline: 82 Goal status: INITIAL  3.  Patient will demonstrate full R knee extension for safety with gait. Baseline: -20 Goal status: INITIAL  4.  Patient will be able to ambulate 600' safely with LRAD and normal gait pattern to access community.  Baseline: using RW, very antalgic Goal status: INITIAL  5.  Patient will be able to ascend/descend stairs with 1 HR and reciprocal step pattern safely to access home and community.  Baseline:  Goal status: INITIAL   PLAN:  PT FREQUENCY: 1-2x/week  PT DURATION: 12 weeks  PLANNED INTERVENTIONS: 97110-Therapeutic exercises, 97530- Therapeutic activity, 97112- Neuromuscular re-education, 97535- Self Care, 02859- Manual therapy, [phone]- Gait training, [phone]- Vasopneumatic device, Patient/Family education, Balance training, Stair training, Joint mobilization, Cryotherapy, and Moist heat  PLAN FOR NEXT SESSION: PROM, AROM, light strengthening, vaso if she needs    Tanda KANDICE Sorrow, PTA 10/06/2023, 2:27 PM

## 2023-10-08 ENCOUNTER — Ambulatory Visit: Attending: Orthopedic Surgery | Admitting: Physical Therapy

## 2023-10-08 ENCOUNTER — Encounter: Payer: Self-pay | Admitting: Physical Therapy

## 2023-10-08 DIAGNOSIS — M6281 Muscle weakness (generalized): Secondary | ICD-10-CM | POA: Diagnosis present

## 2023-10-08 DIAGNOSIS — Z96651 Presence of right artificial knee joint: Secondary | ICD-10-CM | POA: Diagnosis present

## 2023-10-08 DIAGNOSIS — R2689 Other abnormalities of gait and mobility: Secondary | ICD-10-CM | POA: Diagnosis present

## 2023-10-08 NOTE — Therapy (Signed)
 OUTPATIENT PHYSICAL THERAPY LOWER EXTREMITY    Patient Name: Taylor Silva MRN: [id number] DOB:[birth year], 68 y.o., female Today's Date: 10/08/2023  END OF SESSION:  PT End of Session - 10/08/23 1305     Visit Number 4    Date for Recertification  12/17/23    PT Start Time 1300    PT Stop Time 1345    PT Time Calculation (min) 45 min    Activity Tolerance Patient limited by pain;Patient tolerated treatment well    Behavior During Therapy WFL for tasks assessed/performed          Past Medical History:  Diagnosis Date   Anxiety    Arthritis    Asthma    Depression    DJD (degenerative joint disease), lumbar    Fructose intolerance    GERD (gastroesophageal reflux disease)    H/O hiatal hernia    Hard of hearing    bilat    Headache    migraines   Heart murmur    echo done 80's neg   History of bronchitis    History of frequent urinary tract infections    History of vertigo    Hyperlipidemia    Hypertension    currently taking no medications    Obesity    Pinched nerve    in back   PONV (postoperative nausea and vomiting)    patch used last time   Pre-diabetes    Restless legs    Seasonal allergies    Sleep apnea    Intolerant to CPAP last sleep study   Tinnitus    Wears glasses    Past Surgical History:  Procedure Laterality Date   ABDOMINAL HYSTERECTOMY  1995   No oophorectomy   BREATH TEK H PYLORI N/A 06/23/2014   Procedure: BREATH TEK H PYLORI;  Surgeon: Camellia Blush, MD;  Location: [facility];  Service: General;  Laterality: N/A;   CHOLECYSTECTOMY N/A 07/12/2015   Procedure: LAPAROSCOPIC CHOLECYSTECTOMY WITH INTRAOPERATIVE CHOLANGIOGRAM;  Surgeon: Camellia Blush, MD;  Location: [facility];  Service: General;  Laterality: N/A;   DIAGNOSTIC LAPAROSCOPY     DILATION AND CURETTAGE OF UTERUS     KNEE SURGERY  04/2009   right knee, meniscus tear   LAPAROSCOPIC GASTRIC SLEEVE RESECTION WITH HIATAL HERNIA REPAIR N/A 10/03/2014   Procedure: LAPAROSCOPIC GASTRIC  SLEEVE RESECTION WITH HIATAL HERNIA REPAIR W UPPER ENDO;  Surgeon: Camellia Blush, MD;  Location: [facility];  Service: General;  Laterality: N/A;   ORIF ANKLE FRACTURE Right 10/17/2013   Procedure: OPEN REDUCTION INTERNAL FIXATION (ORIF) RIGHT ANKLE DISTAL FIBULA/LATERAL MALLEOLUS;  Surgeon: Marcey Raman, MD;  Location: [facility];  Service: Orthopedics;  Laterality: Right;   OTHER SURGICAL HISTORY  1995?   removed scar tissue from colon   thigh tuck Bilateral    inner thigh tuck   TOTAL KNEE ARTHROPLASTY Right 09/15/2023   Procedure: ARTHROPLASTY, KNEE, TOTAL;  Surgeon: Ernie Cough, MD;  Location: [facility];  Service: Orthopedics;  Laterality: Right;   UPPER GI ENDOSCOPY N/A 10/03/2014   Procedure: UPPER GI ENDOSCOPY;  Surgeon: Camellia Blush, MD;  Location: [facility];  Service: General;  Laterality: N/A;   WRIST SURGERY  1980s   cyst removed from right wrist   Patient Active Problem List   Diagnosis Date Noted   S/P total knee arthroplasty, right 09/15/2023   Neck pain 10/26/2020   Sinusitis 02/21/2019   Asthma intermitent mild 02/04/2016   S/P laparoscopic cholecystectomy 07/12/2015   GERD (gastroesophageal reflux disease) 10/03/2014  Hiatal hernia 10/03/2014   S/P laparoscopic sleeve gastrectomy 10/03/2014   Preoperative clearance 05/19/2014   Hyperglycemia 04/06/2014   Hypertension 04/06/2014   Hyperlipidemia 04/06/2014   Headache 08/02/2013   Chronic rhinitis 08/02/2013   Obesity, Class III, BMI 40-49.9 (morbid obesity) (HCC) 10/06/2012   Encounter for screening for malignant neoplasm of breast 06/15/2012   RLS (restless legs syndrome) 06/15/2012   Depression 06/06/2011   Joint pain 06/06/2011   OSA (obstructive sleep apnea) 06/06/2011   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    PCP: Artist Cohens  REFERRING PROVIDER: Donnice Car  REFERRING DIAG: Rt TKA 09/15/23  THERAPY DIAG:  Status post total right knee replacement  Muscle weakness (generalized)  Other abnormalities of gait and  mobility  Rationale for Evaluation and Treatment: Rehabilitation  ONSET DATE: 09/15/23  SUBJECTIVE:   SUBJECTIVE STATEMENT: Ive been hurting ever since Back has been bothering her too   PERTINENT HISTORY: R ORIF 2015, R TKA 09/15/23 All other PMH see above  PAIN:  Are you having pain? Yes: NPRS scale: 7/10 Pain location: R knee, R ankle is hurting too  Pain description: sharp, shooting, dull, ache, stabbing Aggravating factors: walking, standing, bending to sit down, getting out of bed  Relieving factors: pain meds, icing   PRECAUTIONS: Knee  RED FLAGS: None   WEIGHT BEARING RESTRICTIONS: No  FALLS:  Has patient fallen in last 6 months? Yes. Number of falls 3  LIVING ENVIRONMENT: Lives with: lives with their family Lives in: House/apartment Stairs: Yes: External: 2 steps; on right going up Has following equipment at home: Single point cane and Walker - 2 wheeled  OCCUPATION: Retired  PLOF: Independent and Independent with basic ADLs  PATIENT GOALS: to get back to normal function   NEXT MD VISIT: 09/30/23  OBJECTIVE:  Note: Objective measures were completed at Evaluation unless otherwise noted.  DIAGNOSTIC FINDINGS: N/A for knee  COGNITION: Overall cognitive status: Within functional limits for tasks assessed     SENSATION: Light touch: Impaired  and numb around knee  EDEMA:  Increased edema around R knee   POSTURE: rounded shoulders and flexed trunk   PALPATION: Warm and TTP around R knee, numbness along lateral knee  LOWER EXTREMITY ROM:  Active ROM Right eval Left eval 09/29/23 10/08/23  Hip flexion      Hip extension      Hip abduction      Hip adduction      Hip internal rotation      Hip external rotation      Knee flexion 82  92 96  Knee extension -20  5 3   Ankle dorsiflexion      Ankle plantarflexion      Ankle inversion      Ankle eversion       (Blank rows = not tested)  LOWER EXTREMITY MMT:  MMT Right eval Left eval  Hip  flexion 3+   Hip extension    Hip abduction    Hip adduction    Hip internal rotation    Hip external rotation    Knee flexion 2+   Knee extension 2+   Ankle dorsiflexion    Ankle plantarflexion    Ankle inversion    Ankle eversion     (Blank rows = not tested)  FUNCTIONAL TESTS:  5 times sit to stand: 18s from elevated mat table, compensates  Timed up and go (TUG): 29s   GAIT: Distance walked: in clinic distances Assistive device utilized: Environmental consultant - 2 wheeled Level of  assistance: Modified independence Comments: increased fatigue, decrease stance time and step length with RLE, antalgic gait, very flexed walker reports she has some back issues and a pinched nerve                                                                                                                                TREATMENT DATE: 10/08/23 NuStep L5 x6 min PROM flex and ext with end range holds  Patellar mobs Sit to stand 2x10  4in step up x 10 each 6in step up x10 L, x5 R Leg press 20lb 2x10  10/06/23 PROM flex and ext with end range holds  Patellar mobs NuStep L 5 x 6 min LAQ 2 sets 15 Red tband HS curl 2 sets 10s Sit to stands 3x5 Standing marches 2x10  09/29/23 PROM flex and ext AROM 5-92 LAQ 2 sets 10 Red tband HS curl 2 sets 10s Fitter 1 blue flex and ext 2 sets 10 Nustep L 4 Amb with SPC sup with cuing VASO low 10 min     09/24/23 EVAL    PATIENT EDUCATION:  Education details: POC, HEP, importance of icing and elevating Person educated: Patient Education method: Medical illustrator Education comprehension: verbalized understanding  HOME EXERCISE PROGRAM: Access Code: [email or web address] URL: [email or web address] Date: 09/24/2023 Prepared by: Almetta Fam  Exercises - Seated Long Arc Quad  - 1 x daily - 7 x weekly - 2 sets - 10 reps - Sit to Stand  - 1 x daily - 7 x weekly - 2 sets - 10 reps - Supine Quad Set  - 1 x daily - 7 x weekly - 2 sets - 10  reps - 3 hold - Seated Knee Flexion Slide  - 1 x daily - 7 x weekly - 2 sets - 10 reps - 3 hold - Supine Heel Slide with Strap  - 1 x daily - 7 x weekly - 2 sets - 10 reps - 3 hold  ASSESSMENT:  CLINICAL IMPRESSION: Pt enters in pain but appears to be I good spirits. Progressed with more functional interventions. Six inch step up completed well, some RLE weakness with the 6in step ups. Some end range pain with PROM.  sensitivity remains with patellar mobs. Pt ambulated during session without AD   Patient is a 68 y.o. female who was seen today for physical therapy evaluation and treatment for R TKA done on 09/15/23. She is walking with very flexed and antalgic gait over the walker. She needs a standing rest break walking from the lobby to treatment room. Pt reports some ongoing back issues which is why she is flexed at the trunk and uses the walker for support. Prior to surgery, she did not use an AD. She presents with increased swelling, pain, and gait impairments at evaluation. Patient will benefit from skilled PT to address her R knee ROM and strength deficits to be able to normalize her gait pattern  and return to PLOF. She does have transportation limits until she is able to drive on her own again.   OBJECTIVE IMPAIRMENTS: Abnormal gait, decreased activity tolerance, decreased balance, decreased endurance, difficulty walking, decreased ROM, decreased strength, obesity, and pain.   ACTIVITY LIMITATIONS: carrying, lifting, bending, sitting, standing, squatting, sleeping, stairs, transfers, toileting, and locomotion level  PARTICIPATION LIMITATIONS: meal prep, cleaning, laundry, driving, shopping, and community activity  PERSONAL FACTORS: Fitness and Transportation are also affecting patient's functional outcome.   REHAB POTENTIAL: Good  CLINICAL DECISION MAKING: Stable/uncomplicated  EVALUATION COMPLEXITY: Low  GOALS: Goals reviewed with patient? Yes   SHORT TERM GOALS: Target date:  11/05/23  Independent with initial HEP. Baseline:  Goal status: 09/29/23 MET  2.  Patient will demonstrate improved functional LE strength by completing 5x STS in <15 seconds.   Baseline: 18s with compensation, high mat table Goal status: INITIAL   LONG TERM GOALS: Target date: 12/17/23  Independent with advanced/ongoing HEP to improve outcomes and carryover.  Baseline:  Goal status: INITIAL  2.  Patient will demonstrate R knee flexion to 120 deg to ascend/descend stairs. Baseline: 82 Goal status: Progressing 10/08/23  3.  Patient will demonstrate full R knee extension for safety with gait. Baseline: -20 Goal status: Progressing 10/08/23  4.  Patient will be able to ambulate 600' safely with LRAD and normal gait pattern to access community.  Baseline: using RW, very antalgic Goal status: INITIAL  5.  Patient will be able to ascend/descend stairs with 1 HR and reciprocal step pattern safely to access home and community.  Baseline:  Goal status: INITIAL   PLAN:  PT FREQUENCY: 1-2x/week  PT DURATION: 12 weeks  PLANNED INTERVENTIONS: 97110-Therapeutic exercises, 97530- Therapeutic activity, 97112- Neuromuscular re-education, 97535- Self Care, 02859- Manual therapy, [phone]- Gait training, [phone]- Vasopneumatic device, Patient/Family education, Balance training, Stair training, Joint mobilization, Cryotherapy, and Moist heat  PLAN FOR NEXT SESSION: PROM, AROM, light strengthening, vaso if she needs    Tanda KANDICE Sorrow, PTA 10/08/2023, 1:06 PM

## 2023-10-13 NOTE — Therapy (Incomplete)
 OUTPATIENT PHYSICAL THERAPY LOWER EXTREMITY    Patient Name: Taylor Silva MRN: [id number] DOB:Feb 28, 1955, 68 y.o., female Today's Date: 10/13/2023  END OF SESSION:    Past Medical History:  Diagnosis Date   Anxiety    Arthritis    Asthma    Depression    DJD (degenerative joint disease), lumbar    Fructose intolerance    GERD (gastroesophageal reflux disease)    H/O hiatal hernia    Hard of hearing    bilat    Headache    migraines   Heart murmur    echo done 80's neg   History of bronchitis    History of frequent urinary tract infections    History of vertigo    Hyperlipidemia    Hypertension    currently taking no medications    Obesity    Pinched nerve    in back   PONV (postoperative nausea and vomiting)    patch used last time   Pre-diabetes    Restless legs    Seasonal allergies    Sleep apnea    Intolerant to CPAP last sleep study   Tinnitus    Wears glasses    Past Surgical History:  Procedure Laterality Date   ABDOMINAL HYSTERECTOMY  1995   No oophorectomy   BREATH TEK H PYLORI N/A 06/23/2014   Procedure: BREATH TEK H PYLORI;  Surgeon: Camellia Blush, MD;  Location: [facility];  Service: General;  Laterality: N/A;   CHOLECYSTECTOMY N/A 07/12/2015   Procedure: LAPAROSCOPIC CHOLECYSTECTOMY WITH INTRAOPERATIVE CHOLANGIOGRAM;  Surgeon: Camellia Blush, MD;  Location: [facility];  Service: General;  Laterality: N/A;   DIAGNOSTIC LAPAROSCOPY     DILATION AND CURETTAGE OF UTERUS     KNEE SURGERY  04/2009   right knee, meniscus tear   LAPAROSCOPIC GASTRIC SLEEVE RESECTION WITH HIATAL HERNIA REPAIR N/A 10/03/2014   Procedure: LAPAROSCOPIC GASTRIC SLEEVE RESECTION WITH HIATAL HERNIA REPAIR W UPPER ENDO;  Surgeon: Camellia Blush, MD;  Location: [facility];  Service: General;  Laterality: N/A;   ORIF ANKLE FRACTURE Right 10/17/2013   Procedure: OPEN REDUCTION INTERNAL FIXATION (ORIF) RIGHT ANKLE DISTAL FIBULA/LATERAL MALLEOLUS;  Surgeon: Marcey Raman, MD;  Location: [facility];   Service: Orthopedics;  Laterality: Right;   OTHER SURGICAL HISTORY  1995?   removed scar tissue from colon   thigh tuck Bilateral    inner thigh tuck   TOTAL KNEE ARTHROPLASTY Right 09/15/2023   Procedure: ARTHROPLASTY, KNEE, TOTAL;  Surgeon: Ernie Cough, MD;  Location: [facility];  Service: Orthopedics;  Laterality: Right;   UPPER GI ENDOSCOPY N/A 10/03/2014   Procedure: UPPER GI ENDOSCOPY;  Surgeon: Camellia Blush, MD;  Location: [facility];  Service: General;  Laterality: N/A;   WRIST SURGERY  1980s   cyst removed from right wrist   Patient Active Problem List   Diagnosis Date Noted   S/P total knee arthroplasty, right 09/15/2023   Neck pain 10/26/2020   Sinusitis 02/21/2019   Asthma intermitent mild 02/04/2016   S/P laparoscopic cholecystectomy 07/12/2015   GERD (gastroesophageal reflux disease) 10/03/2014   Hiatal hernia 10/03/2014   S/P laparoscopic sleeve gastrectomy 10/03/2014   Preoperative clearance 05/19/2014   Hyperglycemia 04/06/2014   Hypertension 04/06/2014   Hyperlipidemia 04/06/2014   Headache 08/02/2013   Chronic rhinitis 08/02/2013   Obesity, Class III, BMI 40-49.9 (morbid obesity) (HCC) 10/06/2012   Encounter for screening for malignant neoplasm of breast 06/15/2012   RLS (restless legs syndrome) 06/15/2012   Depression 06/06/2011   Joint  pain 06/06/2011   OSA (obstructive sleep apnea) 06/06/2011   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  Rationale for Evaluation and Treatment: Rehabilitation  ONSET DATE: 09/15/23  SUBJECTIVE:   SUBJECTIVE STATEMENT: Ive been hurting ever since Back has been bothering her too   PERTINENT HISTORY: R ORIF 2015, R TKA 09/15/23 All other PMH see above  PAIN:  Are you having pain? Yes: NPRS scale: 7/10 Pain location: R knee, R ankle is hurting too  Pain description: sharp, shooting, dull, ache,  stabbing Aggravating factors: walking, standing, bending to sit down, getting out of bed  Relieving factors: pain meds, icing   PRECAUTIONS: Knee  RED FLAGS: None   WEIGHT BEARING RESTRICTIONS: No  FALLS:  Has patient fallen in last 6 months? Yes. Number of falls 3  LIVING ENVIRONMENT: Lives with: lives with their family Lives in: House/apartment Stairs: Yes: External: 2 steps; on right going up Has following equipment at home: Single point cane and Walker - 2 wheeled  OCCUPATION: Retired  PLOF: Independent and Independent with basic ADLs  PATIENT GOALS: to get back to normal function   NEXT MD VISIT: 09/30/23  OBJECTIVE:  Note: Objective measures were completed at Evaluation unless otherwise noted.  DIAGNOSTIC FINDINGS: N/A for knee  COGNITION: Overall cognitive status: Within functional limits for tasks assessed     SENSATION: Light touch: Impaired  and numb around knee  EDEMA:  Increased edema around R knee   POSTURE: rounded shoulders and flexed trunk   PALPATION: Warm and TTP around R knee, numbness along lateral knee  LOWER EXTREMITY ROM:  Active ROM Right eval Left eval 09/29/23 10/08/23  Hip flexion      Hip extension      Hip abduction      Hip adduction      Hip internal rotation      Hip external rotation      Knee flexion 82  92 96  Knee extension -20  5 3   Ankle dorsiflexion      Ankle plantarflexion      Ankle inversion      Ankle eversion       (Blank rows = not tested)  LOWER EXTREMITY MMT:  MMT Right eval Left eval  Hip flexion 3+   Hip extension    Hip abduction    Hip adduction    Hip internal rotation    Hip external rotation    Knee flexion 2+   Knee extension 2+   Ankle dorsiflexion    Ankle plantarflexion    Ankle inversion    Ankle eversion     (Blank rows = not tested)  FUNCTIONAL TESTS:  5 times sit to stand: 18s from elevated mat table, compensates  Timed up and go (TUG): 29s   GAIT: Distance walked: in  clinic distances Assistive device utilized: Environmental consultant - 2 wheeled Level of assistance: Modified independence Comments: increased fatigue, decrease stance time and step length with RLE, antalgic gait, very flexed walker reports she has some back issues and a pinched nerve  TREATMENT DATE: 10/14/23 NuStep PROM  STS LAQ HS curls Step ups  Step overs   10/08/23 NuStep L5 x6 min PROM flex and ext with end range holds  Patellar mobs Sit to stand 2x10  4in step up x 10 each 6in step up x10 L, x5 R Leg press 20lb 2x10  10/06/23 PROM flex and ext with end range holds  Patellar mobs NuStep L 5 x 6 min LAQ 2 sets 15 Red tband HS curl 2 sets 10s Sit to stands 3x5 Standing marches 2x10  09/29/23 PROM flex and ext AROM 5-92 LAQ 2 sets 10 Red tband HS curl 2 sets 10s Fitter 1 blue flex and ext 2 sets 10 Nustep L 4 Amb with SPC sup with cuing VASO low 10 min     09/24/23 EVAL    PATIENT EDUCATION:  Education details: POC, HEP, importance of icing and elevating Person educated: Patient Education method: Medical illustrator Education comprehension: verbalized understanding  HOME EXERCISE PROGRAM: Access Code: [email or web address] URL: [email or web address] Date: 09/24/2023 Prepared by: Almetta Fam  Exercises - Seated Long Arc Quad  - 1 x daily - 7 x weekly - 2 sets - 10 reps - Sit to Stand  - 1 x daily - 7 x weekly - 2 sets - 10 reps - Supine Quad Set  - 1 x daily - 7 x weekly - 2 sets - 10 reps - 3 hold - Seated Knee Flexion Slide  - 1 x daily - 7 x weekly - 2 sets - 10 reps - 3 hold - Supine Heel Slide with Strap  - 1 x daily - 7 x weekly - 2 sets - 10 reps - 3 hold  ASSESSMENT:  CLINICAL IMPRESSION: Pt enters in pain but appears to be I good spirits. Progressed with more functional interventions. Six inch step up completed well,  some RLE weakness with the 6in step ups. Some end range pain with PROM.  sensitivity remains with patellar mobs. Pt ambulated during session without AD   Patient is a 68 y.o. female who was seen today for physical therapy evaluation and treatment for R TKA done on 09/15/23. She is walking with very flexed and antalgic gait over the walker. She needs a standing rest break walking from the lobby to treatment room. Pt reports some ongoing back issues which is why she is flexed at the trunk and uses the walker for support. Prior to surgery, she did not use an AD. She presents with increased swelling, pain, and gait impairments at evaluation. Patient will benefit from skilled PT to address her R knee ROM and strength deficits to be able to normalize her gait pattern and return to PLOF. She does have transportation limits until she is able to drive on her own again.   OBJECTIVE IMPAIRMENTS: Abnormal gait, decreased activity tolerance, decreased balance, decreased endurance, difficulty walking, decreased ROM, decreased strength, obesity, and pain.   ACTIVITY LIMITATIONS: carrying, lifting, bending, sitting, standing, squatting, sleeping, stairs, transfers, toileting, and locomotion level  PARTICIPATION LIMITATIONS: meal prep, cleaning, laundry, driving, shopping, and community activity  PERSONAL FACTORS: Fitness and Transportation are also affecting patient's functional outcome.   REHAB POTENTIAL: Good  CLINICAL DECISION MAKING: Stable/uncomplicated  EVALUATION COMPLEXITY: Low  GOALS: Goals reviewed with patient? Yes   SHORT TERM GOALS: Target date: 11/05/23  Independent with initial HEP. Baseline:  Goal status: 09/29/23 MET  2.  Patient will demonstrate improved functional LE strength by completing 5x STS in <15 seconds.  Baseline: 18s with compensation, high mat table Goal status: INITIAL   LONG TERM GOALS: Target date: 12/17/23  Independent with advanced/ongoing HEP to improve outcomes  and carryover.  Baseline:  Goal status: INITIAL  2.  Patient will demonstrate R knee flexion to 120 deg to ascend/descend stairs. Baseline: 82 Goal status: Progressing 10/08/23  3.  Patient will demonstrate full R knee extension for safety with gait. Baseline: -20 Goal status: Progressing 10/08/23  4.  Patient will be able to ambulate 600' safely with LRAD and normal gait pattern to access community.  Baseline: using RW, very antalgic Goal status: INITIAL  5.  Patient will be able to ascend/descend stairs with 1 HR and reciprocal step pattern safely to access home and community.  Baseline:  Goal status: INITIAL   PLAN:  PT FREQUENCY: 1-2x/week  PT DURATION: 12 weeks  PLANNED INTERVENTIONS: 97110-Therapeutic exercises, 97530- Therapeutic activity, [id number]- Neuromuscular re-education, 97535- Self Care, [id number]- Manual therapy, [phone]- Gait training, [phone]- Vasopneumatic device, Patient/Family education, Balance training, Stair training, Joint mobilization, Cryotherapy, and Moist heat  PLAN FOR NEXT SESSION: PROM, AROM, light strengthening, vaso if she needs    Almetta Fam, PT 10/13/2023, 10:24 AM

## 2023-10-14 ENCOUNTER — Ambulatory Visit

## 2023-10-20 NOTE — Therapy (Signed)
 OUTPATIENT PHYSICAL THERAPY LOWER EXTREMITY    Patient Name: Taylor Silva MRN: [id number] DOB:May 13, 1955, 68 y.o., female Today's Date: 10/21/2023  END OF SESSION:  PT End of Session - 10/21/23 0932     Visit Number 5    Date for Recertification  12/17/23    PT Start Time 0933    PT Stop Time 1015    PT Time Calculation (min) 42 min    Activity Tolerance Patient limited by pain;Patient tolerated treatment well    Behavior During Therapy WFL for tasks assessed/performed           Past Medical History:  Diagnosis Date   Anxiety    Arthritis    Asthma    Depression    DJD (degenerative joint disease), lumbar    Fructose intolerance    GERD (gastroesophageal reflux disease)    H/O hiatal hernia    Hard of hearing    bilat    Headache    migraines   Heart murmur    echo done 80's neg   History of bronchitis    History of frequent urinary tract infections    History of vertigo    Hyperlipidemia    Hypertension    currently taking no medications    Obesity    Pinched nerve    in back   PONV (postoperative nausea and vomiting)    patch used last time   Pre-diabetes    Restless legs    Seasonal allergies    Sleep apnea    Intolerant to CPAP last sleep study   Tinnitus    Wears glasses    Past Surgical History:  Procedure Laterality Date   ABDOMINAL HYSTERECTOMY  1995   No oophorectomy   BREATH TEK H PYLORI N/A 06/23/2014   Procedure: BREATH TEK H PYLORI;  Surgeon: Camellia Blush, MD;  Location: [facility];  Service: General;  Laterality: N/A;   CHOLECYSTECTOMY N/A 07/12/2015   Procedure: LAPAROSCOPIC CHOLECYSTECTOMY WITH INTRAOPERATIVE CHOLANGIOGRAM;  Surgeon: Camellia Blush, MD;  Location: [facility];  Service: General;  Laterality: N/A;   DIAGNOSTIC LAPAROSCOPY     DILATION AND CURETTAGE OF UTERUS     KNEE SURGERY  04/2009   right knee, meniscus tear   LAPAROSCOPIC GASTRIC SLEEVE RESECTION WITH HIATAL HERNIA REPAIR N/A 10/03/2014   Procedure: LAPAROSCOPIC  GASTRIC SLEEVE RESECTION WITH HIATAL HERNIA REPAIR W UPPER ENDO;  Surgeon: Camellia Blush, MD;  Location: [facility];  Service: General;  Laterality: N/A;   ORIF ANKLE FRACTURE Right 10/17/2013   Procedure: OPEN REDUCTION INTERNAL FIXATION (ORIF) RIGHT ANKLE DISTAL FIBULA/LATERAL MALLEOLUS;  Surgeon: Marcey Raman, MD;  Location: [facility];  Service: Orthopedics;  Laterality: Right;   OTHER SURGICAL HISTORY  1995?   removed scar tissue from colon   thigh tuck Bilateral    inner thigh tuck   TOTAL KNEE ARTHROPLASTY Right 09/15/2023   Procedure: ARTHROPLASTY, KNEE, TOTAL;  Surgeon: Ernie Cough, MD;  Location: [facility];  Service: Orthopedics;  Laterality: Right;   UPPER GI ENDOSCOPY N/A 10/03/2014   Procedure: UPPER GI ENDOSCOPY;  Surgeon: Camellia Blush, MD;  Location: [facility];  Service: General;  Laterality: N/A;   WRIST SURGERY  1980s   cyst removed from right wrist   Patient Active Problem List   Diagnosis Date Noted   S/P total knee arthroplasty, right 09/15/2023   Neck pain 10/26/2020   Sinusitis 02/21/2019   Asthma intermitent mild 02/04/2016   S/P laparoscopic cholecystectomy 07/12/2015   GERD (gastroesophageal reflux disease)  10/03/2014   Hiatal hernia 10/03/2014   S/P laparoscopic sleeve gastrectomy 10/03/2014   Preoperative clearance 05/19/2014   Hyperglycemia 04/06/2014   Hypertension 04/06/2014   Hyperlipidemia 04/06/2014   Headache 08/02/2013   Chronic rhinitis 08/02/2013   Obesity, Class III, BMI 40-49.9 (morbid obesity) (HCC) 10/06/2012   Encounter for screening for malignant neoplasm of breast 06/15/2012   RLS (restless legs syndrome) 06/15/2012   Depression 06/06/2011   Joint pain 06/06/2011   OSA (obstructive sleep apnea) 06/06/2011   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    PCP: Artist Cohens  REFERRING PROVIDER: Donnice Car  REFERRING DIAG: Rt TKA 09/15/23  THERAPY DIAG:  Status post total right knee replacement  Muscle weakness (generalized)  Other abnormalities of gait and  mobility  Rationale for Evaluation and Treatment: Rehabilitation  ONSET DATE: 09/15/23  SUBJECTIVE:   SUBJECTIVE STATEMENT: Some days are better than others. Some pain today. 3-4 for pain.    PERTINENT HISTORY: R ORIF 2015, R TKA 09/15/23 All other PMH see above  PAIN:  Are you having pain? Yes: NPRS scale: 7/10 Pain location: R knee, R ankle is hurting too  Pain description: sharp, shooting, dull, ache, stabbing Aggravating factors: walking, standing, bending to sit down, getting out of bed  Relieving factors: pain meds, icing   PRECAUTIONS: Knee  RED FLAGS: None   WEIGHT BEARING RESTRICTIONS: No  FALLS:  Has patient fallen in last 6 months? Yes. Number of falls 3  LIVING ENVIRONMENT: Lives with: lives with their family Lives in: House/apartment Stairs: Yes: External: 2 steps; on right going up Has following equipment at home: Single point cane and Walker - 2 wheeled  OCCUPATION: Retired  PLOF: Independent and Independent with basic ADLs  PATIENT GOALS: to get back to normal function   NEXT MD VISIT: 09/30/23  OBJECTIVE:  Note: Objective measures were completed at Evaluation unless otherwise noted.  DIAGNOSTIC FINDINGS: N/A for knee  COGNITION: Overall cognitive status: Within functional limits for tasks assessed     SENSATION: Light touch: Impaired  and numb around knee  EDEMA:  Increased edema around R knee   POSTURE: rounded shoulders and flexed trunk   PALPATION: Warm and TTP around R knee, numbness along lateral knee  LOWER EXTREMITY ROM:  Active ROM Right eval Left eval 09/29/23 10/08/23 10/21/23  Hip flexion       Hip extension       Hip abduction       Hip adduction       Hip internal rotation       Hip external rotation       Knee flexion 82  92 96 98  Knee extension -20  5 3 2   Ankle dorsiflexion       Ankle plantarflexion       Ankle inversion       Ankle eversion        (Blank rows = not tested)  LOWER EXTREMITY MMT:  MMT  Right eval Left eval  Hip flexion 3+   Hip extension    Hip abduction    Hip adduction    Hip internal rotation    Hip external rotation    Knee flexion 2+   Knee extension 2+   Ankle dorsiflexion    Ankle plantarflexion    Ankle inversion    Ankle eversion     (Blank rows = not tested)  FUNCTIONAL TESTS:  5 times sit to stand: 18s from elevated mat table, compensates  Timed up and go (TUG):  29s   GAIT: Distance walked: in clinic distances Assistive device utilized: Environmental consultant - 2 wheeled Level of assistance: Modified independence Comments: increased fatigue, decrease stance time and step length with RLE, antalgic gait, very flexed walker reports she has some back issues and a pinched nerve                                                                                                                                TREATMENT DATE: 10/21/23 PROM flex and ext, end range holds, education for scar tissue mobs  LAQ 3# 2x10 HS curls 2x10 STS 2x10 NuStep L5x6mins  Step overs 4 Step ups 4   10/08/23 NuStep L5 x6 min PROM flex and ext with end range holds  Patellar mobs Sit to stand 2x10  4in step up x 10 each 6in step up x10 L, x5 R Leg press 20lb 2x10  10/06/23 PROM flex and ext with end range holds  Patellar mobs NuStep L 5 x 6 min LAQ 2 sets 15 Red tband HS curl 2 sets 10s Sit to stands 3x5 Standing marches 2x10  09/29/23 PROM flex and ext AROM 5-92 LAQ 2 sets 10 Red tband HS curl 2 sets 10s Fitter 1 blue flex and ext 2 sets 10 Nustep L 4 Amb with SPC sup with cuing VASO low 10 min     09/24/23 EVAL    PATIENT EDUCATION:  Education details: POC, HEP, importance of icing and elevating Person educated: Patient Education method: Medical illustrator Education comprehension: verbalized understanding  HOME EXERCISE PROGRAM: Access Code: [email or web address] URL: [email or web address] Date: 09/24/2023 Prepared by: Almetta Fam  Exercises - Seated Long Arc Quad  - 1 x daily - 7 x weekly - 2 sets - 10 reps - Sit to Stand  - 1 x daily - 7 x weekly - 2 sets - 10 reps - Supine Quad Set  - 1 x daily - 7 x weekly - 2 sets - 10 reps - 3 hold - Seated Knee Flexion Slide  - 1 x daily - 7 x weekly - 2 sets - 10 reps - 3 hold - Supine Heel Slide with Strap  - 1 x daily - 7 x weekly - 2 sets - 10 reps - 3 hold  ASSESSMENT:  CLINICAL IMPRESSION: Pt is progressing with ROM and strength, still has some pain. Progressed with more functional interventions. Six inch step ups are remain difficult, lots of compensations and use of UE to pull up. Does better with the four inch step. Patient has some pain in L hip with step ups and reports she has bursitis.    Patient is a 68 y.o. female who was seen today for physical therapy evaluation and treatment for R TKA done on 09/15/23. She is walking with very flexed and antalgic gait over the walker. She needs a standing rest break walking from the lobby to treatment room. Pt reports some ongoing back  issues which is why she is flexed at the trunk and uses the walker for support. Prior to surgery, she did not use an AD. She presents with increased swelling, pain, and gait impairments at evaluation. Patient will benefit from skilled PT to address her R knee ROM and strength deficits to be able to normalize her gait pattern and return to PLOF. She does have transportation limits until she is able to drive on her own again.   OBJECTIVE IMPAIRMENTS: Abnormal gait, decreased activity tolerance, decreased balance, decreased endurance, difficulty walking, decreased ROM, decreased strength, obesity, and pain.   ACTIVITY LIMITATIONS: carrying, lifting, bending, sitting, standing, squatting, sleeping, stairs, transfers, toileting, and locomotion level  PARTICIPATION LIMITATIONS: meal prep, cleaning, laundry, driving, shopping, and community activity  PERSONAL FACTORS: Fitness and Transportation are  also affecting patient's functional outcome.   REHAB POTENTIAL: Good  CLINICAL DECISION MAKING: Stable/uncomplicated  EVALUATION COMPLEXITY: Low  GOALS: Goals reviewed with patient? Yes   SHORT TERM GOALS: Target date: 11/05/23  Independent with initial HEP. Baseline:  Goal status: 09/29/23 MET  2.  Patient will demonstrate improved functional LE strength by completing 5x STS in <15 seconds.   Baseline: 18s with compensation, high mat table Goal status: INITIAL   LONG TERM GOALS: Target date: 12/17/23  Independent with advanced/ongoing HEP to improve outcomes and carryover.  Baseline:  Goal status: INITIAL  2.  Patient will demonstrate R knee flexion to 120 deg to ascend/descend stairs. Baseline: 82 Goal status: Progressing 10/08/23, 10/21/23  3.  Patient will demonstrate full R knee extension for safety with gait. Baseline: -20 Goal status: Progressing 10/08/23, 10/21/23  4.  Patient will be able to ambulate 600' safely with LRAD and normal gait pattern to access community.  Baseline: using RW, very antalgic Goal status: IN PROGRESS mostly walking without AD 10/21/23  5.  Patient will be able to ascend/descend stairs with 1 HR and reciprocal step pattern safely to access home and community.  Baseline:  Goal status: INITIAL   PLAN:  PT FREQUENCY: 1-2x/week  PT DURATION: 12 weeks  PLANNED INTERVENTIONS: 97110-Therapeutic exercises, 97530- Therapeutic activity, [id number]- Neuromuscular re-education, 97535- Self Care, [id number]- Manual therapy, [phone]- Gait training, [phone]- Vasopneumatic device, Patient/Family education, Balance training, Stair training, Joint mobilization, Cryotherapy, and Moist heat  PLAN FOR NEXT SESSION: PROM, AROM, light strengthening, vaso if she needs    Almetta Fam, PT 10/21/2023, 10:12 AM

## 2023-10-21 ENCOUNTER — Ambulatory Visit

## 2023-10-21 DIAGNOSIS — Z96651 Presence of right artificial knee joint: Secondary | ICD-10-CM | POA: Diagnosis not present

## 2023-10-21 DIAGNOSIS — R2689 Other abnormalities of gait and mobility: Secondary | ICD-10-CM

## 2023-10-21 DIAGNOSIS — M6281 Muscle weakness (generalized): Secondary | ICD-10-CM

## 2023-10-27 ENCOUNTER — Ambulatory Visit

## 2023-10-27 DIAGNOSIS — M6281 Muscle weakness (generalized): Secondary | ICD-10-CM

## 2023-10-27 DIAGNOSIS — Z96651 Presence of right artificial knee joint: Secondary | ICD-10-CM

## 2023-10-27 DIAGNOSIS — R2689 Other abnormalities of gait and mobility: Secondary | ICD-10-CM

## 2023-10-27 NOTE — Therapy (Signed)
 OUTPATIENT PHYSICAL THERAPY LOWER EXTREMITY    Patient Name: Taylor Silva MRN: [id number] DOB:1955-04-16, 68 y.o., female Today's Date: 10/27/2023  END OF SESSION:  PT End of Session - 10/27/23 0934     Visit Number 6    Date for Recertification  12/17/23    PT Start Time 0934    PT Stop Time 1012    PT Time Calculation (min) 38 min    Activity Tolerance Patient tolerated treatment well    Behavior During Therapy WFL for tasks assessed/performed           Past Medical History:  Diagnosis Date   Anxiety    Arthritis    Asthma    Depression    DJD (degenerative joint disease), lumbar    Fructose intolerance    GERD (gastroesophageal reflux disease)    H/O hiatal hernia    Hard of hearing    bilat    Headache    migraines   Heart murmur    echo done 80's neg   History of bronchitis    History of frequent urinary tract infections    History of vertigo    Hyperlipidemia    Hypertension    currently taking no medications    Obesity    Pinched nerve    in back   PONV (postoperative nausea and vomiting)    patch used last time   Pre-diabetes    Restless legs    Seasonal allergies    Sleep apnea    Intolerant to CPAP last sleep study   Tinnitus    Wears glasses    Past Surgical History:  Procedure Laterality Date   ABDOMINAL HYSTERECTOMY  1995   No oophorectomy   BREATH TEK H PYLORI N/A 06/23/2014   Procedure: BREATH TEK H PYLORI;  Surgeon: Camellia Blush, MD;  Location: [facility];  Service: General;  Laterality: N/A;   CHOLECYSTECTOMY N/A 07/12/2015   Procedure: LAPAROSCOPIC CHOLECYSTECTOMY WITH INTRAOPERATIVE CHOLANGIOGRAM;  Surgeon: Camellia Blush, MD;  Location: [facility];  Service: General;  Laterality: N/A;   DIAGNOSTIC LAPAROSCOPY     DILATION AND CURETTAGE OF UTERUS     KNEE SURGERY  04/2009   right knee, meniscus tear   LAPAROSCOPIC GASTRIC SLEEVE RESECTION WITH HIATAL HERNIA REPAIR N/A 10/03/2014   Procedure: LAPAROSCOPIC GASTRIC SLEEVE RESECTION  WITH HIATAL HERNIA REPAIR W UPPER ENDO;  Surgeon: Camellia Blush, MD;  Location: [facility];  Service: General;  Laterality: N/A;   ORIF ANKLE FRACTURE Right 10/17/2013   Procedure: OPEN REDUCTION INTERNAL FIXATION (ORIF) RIGHT ANKLE DISTAL FIBULA/LATERAL MALLEOLUS;  Surgeon: Marcey Raman, MD;  Location: [facility];  Service: Orthopedics;  Laterality: Right;   OTHER SURGICAL HISTORY  1995?   removed scar tissue from colon   thigh tuck Bilateral    inner thigh tuck   TOTAL KNEE ARTHROPLASTY Right 09/15/2023   Procedure: ARTHROPLASTY, KNEE, TOTAL;  Surgeon: Ernie Cough, MD;  Location: [facility];  Service: Orthopedics;  Laterality: Right;   UPPER GI ENDOSCOPY N/A 10/03/2014   Procedure: UPPER GI ENDOSCOPY;  Surgeon: Camellia Blush, MD;  Location: [facility];  Service: General;  Laterality: N/A;   WRIST SURGERY  1980s   cyst removed from right wrist   Patient Active Problem List   Diagnosis Date Noted   S/P total knee arthroplasty, right 09/15/2023   Neck pain 10/26/2020   Sinusitis 02/21/2019   Asthma intermitent mild 02/04/2016   S/P laparoscopic cholecystectomy 07/12/2015   GERD (gastroesophageal reflux disease) 10/03/2014  Hiatal hernia 10/03/2014   S/P laparoscopic sleeve gastrectomy 10/03/2014   Preoperative clearance 05/19/2014   Hyperglycemia 04/06/2014   Hypertension 04/06/2014   Hyperlipidemia 04/06/2014   Headache 08/02/2013   Chronic rhinitis 08/02/2013   Obesity, Class III, BMI 40-49.9 (morbid obesity) (HCC) 10/06/2012   Encounter for screening for malignant neoplasm of breast 06/15/2012   RLS (restless legs syndrome) 06/15/2012   Depression 06/06/2011   Joint pain 06/06/2011   OSA (obstructive sleep apnea) 06/06/2011   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    PCP: Artist Cohens  REFERRING PROVIDER: Donnice Car  REFERRING DIAG: Rt TKA 09/15/23  THERAPY DIAG:  Status post total right knee replacement  Muscle weakness (generalized)  Other abnormalities of gait and mobility  Rationale for  Evaluation and Treatment: Rehabilitation  ONSET DATE: 09/15/23  SUBJECTIVE:   SUBJECTIVE STATEMENT: 3/10 pain currently. I have some burning around the incision and the knee. Exercises are going well, they hurt, but its a good hurt.    PERTINENT HISTORY: R ORIF 2015, R TKA 09/15/23 All other PMH see above  PAIN:  Are you having pain? Yes: NPRS scale: 7/10 Pain location: R knee, R ankle is hurting too  Pain description: sharp, shooting, dull, ache, stabbing Aggravating factors: walking, standing, bending to sit down, getting out of bed  Relieving factors: pain meds, icing   PRECAUTIONS: Knee  RED FLAGS: None   WEIGHT BEARING RESTRICTIONS: No  FALLS:  Has patient fallen in last 6 months? Yes. Number of falls 3  LIVING ENVIRONMENT: Lives with: lives with their family Lives in: House/apartment Stairs: Yes: External: 2 steps; on right going up Has following equipment at home: Single point cane and Walker - 2 wheeled  OCCUPATION: Retired  PLOF: Independent and Independent with basic ADLs  PATIENT GOALS: to get back to normal function   NEXT MD VISIT: 09/30/23  OBJECTIVE:  Note: Objective measures were completed at Evaluation unless otherwise noted.  DIAGNOSTIC FINDINGS: N/A for knee  COGNITION: Overall cognitive status: Within functional limits for tasks assessed     SENSATION: Light touch: Impaired  and numb around knee  EDEMA:  Increased edema around R knee   POSTURE: rounded shoulders and flexed trunk   PALPATION: Warm and TTP around R knee, numbness along lateral knee  LOWER EXTREMITY ROM:  Active ROM Right eval Left eval 09/29/23 10/08/23 10/21/23  Hip flexion       Hip extension       Hip abduction       Hip adduction       Hip internal rotation       Hip external rotation       Knee flexion 82  92 96 98  Knee extension -20  5 3 2   Ankle dorsiflexion       Ankle plantarflexion       Ankle inversion       Ankle eversion        (Blank rows =  not tested)  LOWER EXTREMITY MMT:  MMT Right eval Left eval  Hip flexion 3+   Hip extension    Hip abduction    Hip adduction    Hip internal rotation    Hip external rotation    Knee flexion 2+   Knee extension 2+   Ankle dorsiflexion    Ankle plantarflexion    Ankle inversion    Ankle eversion     (Blank rows = not tested)  FUNCTIONAL TESTS:  5 times sit to stand: 18s from elevated  mat table, compensates  Timed up and go (TUG): 29s   GAIT: Distance walked: in clinic distances Assistive device utilized: Environmental consultant - 2 wheeled Level of assistance: Modified independence Comments: increased fatigue, decrease stance time and step length with RLE, antalgic gait, very flexed walker reports she has some back issues and a pinched nerve                                                                                                                                TREATMENT DATE: 10/27/23: PROM flex and ext, end range holds Nustep lvl 5 for 6 minutes STS 2 x 10 LAQ 4# 2 x 10 each side Ball Squeezes 2 x 12 Knee Flexion ROM - 99 deg Lateral Banded Walks with green tband  10/21/23 PROM flex and ext, end range holds, education for scar tissue mobs  LAQ 3# 2x10 HS curls 2x10 STS 2x10 NuStep L5x26mins  Step overs 4 Step ups 4   10/08/23 NuStep L5 x6 min PROM flex and ext with end range holds  Patellar mobs Sit to stand 2x10  4in step up x 10 each 6in step up x10 L, x5 R Leg press 20lb 2x10  10/06/23 PROM flex and ext with end range holds  Patellar mobs NuStep L 5 x 6 min LAQ 2 sets 15 Red tband HS curl 2 sets 10s Sit to stands 3x5 Standing marches 2x10  09/29/23 PROM flex and ext AROM 5-92 LAQ 2 sets 10 Red tband HS curl 2 sets 10s Fitter 1 blue flex and ext 2 sets 10 Nustep L 4 Amb with SPC sup with cuing VASO low 10 min     09/24/23 EVAL    PATIENT EDUCATION:  Education details: POC, HEP, importance of icing and elevating Person educated:  Patient Education method: Medical illustrator Education comprehension: verbalized understanding  HOME EXERCISE PROGRAM: Access Code: [email or web address] URL: [email or web address] Date: 09/24/2023 Prepared by: Almetta Fam  Exercises - Seated Long Arc Quad  - 1 x daily - 7 x weekly - 2 sets - 10 reps - Sit to Stand  - 1 x daily - 7 x weekly - 2 sets - 10 reps - Supine Quad Set  - 1 x daily - 7 x weekly - 2 sets - 10 reps - 3 hold - Seated Knee Flexion Slide  - 1 x daily - 7 x weekly - 2 sets - 10 reps - 3 hold - Supine Heel Slide with Strap  - 1 x daily - 7 x weekly - 2 sets - 10 reps - 3 hold  ASSESSMENT:  CLINICAL IMPRESSION: Pt is progressing with ROM and strength, still has some pain. Progressed with more functional interventions and exercises. Demos improved reciprocal gait with mild favoring of the RLE. Reassessed ROM and found flexion of R knee to be 99 deg. Making steady progress.   Patient is a 68 y.o. female who was seen today for physical therapy  evaluation and treatment for R TKA done on 09/15/23. She is walking with very flexed and antalgic gait over the walker. She needs a standing rest break walking from the lobby to treatment room. Pt reports some ongoing back issues which is why she is flexed at the trunk and uses the walker for support. Prior to surgery, she did not use an AD. She presents with increased swelling, pain, and gait impairments at evaluation. Patient will benefit from skilled PT to address her R knee ROM and strength deficits to be able to normalize her gait pattern and return to PLOF. She does have transportation limits until she is able to drive on her own again.   OBJECTIVE IMPAIRMENTS: Abnormal gait, decreased activity tolerance, decreased balance, decreased endurance, difficulty walking, decreased ROM, decreased strength, obesity, and pain.   ACTIVITY LIMITATIONS: carrying, lifting, bending, sitting, standing, squatting, sleeping, stairs,  transfers, toileting, and locomotion level  PARTICIPATION LIMITATIONS: meal prep, cleaning, laundry, driving, shopping, and community activity  PERSONAL FACTORS: Fitness and Transportation are also affecting patient's functional outcome.   REHAB POTENTIAL: Good  CLINICAL DECISION MAKING: Stable/uncomplicated  EVALUATION COMPLEXITY: Low  GOALS: Goals reviewed with patient? Yes   SHORT TERM GOALS: Target date: 11/05/23  Independent with initial HEP. Baseline:  Goal status: 09/29/23 MET  2.  Patient will demonstrate improved functional LE strength by completing 5x STS in <15 seconds.   Baseline: 18s with compensation, high mat table Goal status: INITIAL   LONG TERM GOALS: Target date: 12/17/23  Independent with advanced/ongoing HEP to improve outcomes and carryover.  Baseline:  Goal status: INITIAL  2.  Patient will demonstrate R knee flexion to 120 deg to ascend/descend stairs. Baseline: 82 Goal status: Progressing 10/08/23, 10/21/23  3.  Patient will demonstrate full R knee extension for safety with gait. Baseline: -20 Goal status: Progressing 10/08/23, 10/21/23  4.  Patient will be able to ambulate 600' safely with LRAD and normal gait pattern to access community.  Baseline: using RW, very antalgic Goal status: IN PROGRESS mostly walking without AD 10/21/23  5.  Patient will be able to ascend/descend stairs with 1 HR and reciprocal step pattern safely to access home and community.  Baseline:  Goal status: INITIAL   PLAN:  PT FREQUENCY: 1-2x/week  PT DURATION: 12 weeks  PLANNED INTERVENTIONS: 97110-Therapeutic exercises, 97530- Therapeutic activity, [id number]- Neuromuscular re-education, 97535- Self Care, [id number]- Manual therapy, [phone]- Gait training, [phone]- Vasopneumatic device, Patient/Family education, Balance training, Stair training, Joint mobilization, Cryotherapy, and Moist heat  PLAN FOR NEXT SESSION: PROM, AROM, light strengthening, vaso if she needs     Wren Gallaga, PT, DPT 10/27/2023, 10:12 AM

## 2023-10-28 ENCOUNTER — Ambulatory Visit

## 2023-11-03 NOTE — Therapy (Signed)
 OUTPATIENT PHYSICAL THERAPY LOWER EXTREMITY    Patient Name: Taylor Silva MRN: [id number] DOB:1955-06-30, 68 y.o., female Today's Date: 11/04/2023  END OF SESSION:  PT End of Session - 11/04/23 0933     Visit Number 7    Date for Recertification  12/17/23    PT Start Time 0933    PT Stop Time 1015    PT Time Calculation (min) 42 min    Activity Tolerance Patient tolerated treatment well    Behavior During Therapy WFL for tasks assessed/performed            Past Medical History:  Diagnosis Date   Anxiety    Arthritis    Asthma    Depression    DJD (degenerative joint disease), lumbar    Fructose intolerance    GERD (gastroesophageal reflux disease)    H/O hiatal hernia    Hard of hearing    bilat    Headache    migraines   Heart murmur    echo done 80's neg   History of bronchitis    History of frequent urinary tract infections    History of vertigo    Hyperlipidemia    Hypertension    currently taking no medications    Obesity    Pinched nerve    in back   PONV (postoperative nausea and vomiting)    patch used last time   Pre-diabetes    Restless legs    Seasonal allergies    Sleep apnea    Intolerant to CPAP last sleep study   Tinnitus    Wears glasses    Past Surgical History:  Procedure Laterality Date   ABDOMINAL HYSTERECTOMY  1995   No oophorectomy   BREATH TEK H PYLORI N/A 06/23/2014   Procedure: BREATH TEK H PYLORI;  Surgeon: Camellia Blush, MD;  Location: [facility];  Service: General;  Laterality: N/A;   CHOLECYSTECTOMY N/A 07/12/2015   Procedure: LAPAROSCOPIC CHOLECYSTECTOMY WITH INTRAOPERATIVE CHOLANGIOGRAM;  Surgeon: Camellia Blush, MD;  Location: [facility];  Service: General;  Laterality: N/A;   DIAGNOSTIC LAPAROSCOPY     DILATION AND CURETTAGE OF UTERUS     KNEE SURGERY  04/2009   right knee, meniscus tear   LAPAROSCOPIC GASTRIC SLEEVE RESECTION WITH HIATAL HERNIA REPAIR N/A 10/03/2014   Procedure: LAPAROSCOPIC GASTRIC SLEEVE RESECTION  WITH HIATAL HERNIA REPAIR W UPPER ENDO;  Surgeon: Camellia Blush, MD;  Location: [facility];  Service: General;  Laterality: N/A;   ORIF ANKLE FRACTURE Right 10/17/2013   Procedure: OPEN REDUCTION INTERNAL FIXATION (ORIF) RIGHT ANKLE DISTAL FIBULA/LATERAL MALLEOLUS;  Surgeon: Marcey Raman, MD;  Location: [facility];  Service: Orthopedics;  Laterality: Right;   OTHER SURGICAL HISTORY  1995?   removed scar tissue from colon   thigh tuck Bilateral    inner thigh tuck   TOTAL KNEE ARTHROPLASTY Right 09/15/2023   Procedure: ARTHROPLASTY, KNEE, TOTAL;  Surgeon: Ernie Cough, MD;  Location: [facility];  Service: Orthopedics;  Laterality: Right;   UPPER GI ENDOSCOPY N/A 10/03/2014   Procedure: UPPER GI ENDOSCOPY;  Surgeon: Camellia Blush, MD;  Location: [facility];  Service: General;  Laterality: N/A;   WRIST SURGERY  1980s   cyst removed from right wrist   Patient Active Problem List   Diagnosis Date Noted   S/P total knee arthroplasty, right 09/15/2023   Neck pain 10/26/2020   Sinusitis 02/21/2019   Asthma intermitent mild 02/04/2016   S/P laparoscopic cholecystectomy 07/12/2015   GERD (gastroesophageal reflux disease) 10/03/2014  Hiatal hernia 10/03/2014   S/P laparoscopic sleeve gastrectomy 10/03/2014   Preoperative clearance 05/19/2014   Hyperglycemia 04/06/2014   Hypertension 04/06/2014   Hyperlipidemia 04/06/2014   Headache 08/02/2013   Chronic rhinitis 08/02/2013   Obesity, Class III, BMI 40-49.9 (morbid obesity) (HCC) 10/06/2012   Encounter for screening for malignant neoplasm of breast 06/15/2012   RLS (restless legs syndrome) 06/15/2012   Depression 06/06/2011   Joint pain 06/06/2011   OSA (obstructive sleep apnea) 06/06/2011   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    PCP: Artist Cohens  REFERRING PROVIDER: Donnice Car  REFERRING DIAG: Rt TKA 09/15/23  THERAPY DIAG:  Muscle weakness (generalized)  Status post total right knee replacement  Other abnormalities of gait and mobility  Rationale for  Evaluation and Treatment: Rehabilitation  ONSET DATE: 09/15/23  SUBJECTIVE:   SUBJECTIVE STATEMENT: Knee is okay, it still hurts. I go to see the doctor after this. 2-3/10.    PERTINENT HISTORY: R ORIF 2015, R TKA 09/15/23 All other PMH see above  PAIN:  Are you having pain? Yes: NPRS scale: 7/10 Pain location: R knee, R ankle is hurting too  Pain description: sharp, shooting, dull, ache, stabbing Aggravating factors: walking, standing, bending to sit down, getting out of bed  Relieving factors: pain meds, icing   PRECAUTIONS: Knee  RED FLAGS: None   WEIGHT BEARING RESTRICTIONS: No  FALLS:  Has patient fallen in last 6 months? Yes. Number of falls 3  LIVING ENVIRONMENT: Lives with: lives with their family Lives in: House/apartment Stairs: Yes: External: 2 steps; on right going up Has following equipment at home: Single point cane and Walker - 2 wheeled  OCCUPATION: Retired  PLOF: Independent and Independent with basic ADLs  PATIENT GOALS: to get back to normal function   NEXT MD VISIT: 09/30/23  OBJECTIVE:  Note: Objective measures were completed at Evaluation unless otherwise noted.  DIAGNOSTIC FINDINGS: N/A for knee  COGNITION: Overall cognitive status: Within functional limits for tasks assessed     SENSATION: Light touch: Impaired  and numb around knee  EDEMA:  Increased edema around R knee   POSTURE: rounded shoulders and flexed trunk   PALPATION: Warm and TTP around R knee, numbness along lateral knee  LOWER EXTREMITY ROM:  Active ROM Right eval Left eval 09/29/23 10/08/23 10/21/23 11/04/23  Hip flexion        Hip extension        Hip abduction        Hip adduction        Hip internal rotation        Hip external rotation        Knee flexion 82  92 96 98 98  Knee extension -20  5 3 2 2   Ankle dorsiflexion        Ankle plantarflexion        Ankle inversion        Ankle eversion         (Blank rows = not tested)  LOWER EXTREMITY  MMT:  MMT Right eval Left eval  Hip flexion 3+   Hip extension    Hip abduction    Hip adduction    Hip internal rotation    Hip external rotation    Knee flexion 2+   Knee extension 2+   Ankle dorsiflexion    Ankle plantarflexion    Ankle inversion    Ankle eversion     (Blank rows = not tested)  FUNCTIONAL TESTS:  5 times sit to  stand: 18s from elevated mat table, compensates  Timed up and go (TUG): 29s   GAIT: Distance walked: in clinic distances Assistive device utilized: Environmental Consultant - 2 wheeled Level of assistance: Modified independence Comments: increased fatigue, decrease stance time and step length with RLE, antalgic gait, very flexed walker reports she has some back issues and a pinched nerve                                                                                                                                TREATMENT DATE: 11/04/23 STS goal-12s ROM- flexion 98d, ext 2d PROM flexion 110d PROM with end range holds  NuStep L5x45mins  Leg ext 10# 2x10, 5# x10 RLE  HS curls 25# 2x10 Stairs up and down 6 step ups Leg press 40# 2x10, RLE 20# x8   10/27/23: PROM flex and ext, end range holds Nustep lvl 5 for 6 minutes STS 2 x 10 LAQ 4# 2 x 10 each side Ball Squeezes 2 x 12 Knee Flexion ROM - 99 deg Lateral Banded Walks with green tband  10/21/23 PROM flex and ext, end range holds, education for scar tissue mobs  LAQ 3# 2x10 HS curls 2x10 STS 2x10 NuStep L5x22mins  Step overs 4 Step ups 4   10/08/23 NuStep L5 x6 min PROM flex and ext with end range holds  Patellar mobs Sit to stand 2x10  4in step up x 10 each 6in step up x10 L, x5 R Leg press 20lb 2x10  10/06/23 PROM flex and ext with end range holds  Patellar mobs NuStep L 5 x 6 min LAQ 2 sets 15 Red tband HS curl 2 sets 10s Sit to stands 3x5 Standing marches 2x10  09/29/23 PROM flex and ext AROM 5-92 LAQ 2 sets 10 Red tband HS curl 2 sets 10s Fitter 1 blue flex and ext 2 sets  10 Nustep L 4 Amb with SPC sup with cuing VASO low 10 min     09/24/23 EVAL    PATIENT EDUCATION:  Education details: POC, HEP, importance of icing and elevating Person educated: Patient Education method: Medical Illustrator Education comprehension: verbalized understanding  HOME EXERCISE PROGRAM: Access Code: [email or web address] URL: [email or web address] Date: 09/24/2023 Prepared by: Almetta Fam  Exercises - Seated Long Arc Quad  - 1 x daily - 7 x weekly - 2 sets - 10 reps - Sit to Stand  - 1 x daily - 7 x weekly - 2 sets - 10 reps - Supine Quad Set  - 1 x daily - 7 x weekly - 2 sets - 10 reps - 3 hold - Seated Knee Flexion Slide  - 1 x daily - 7 x weekly - 2 sets - 10 reps - 3 hold - Supine Heel Slide with Strap  - 1 x daily - 7 x weekly - 2 sets - 10 reps - 3 hold  ASSESSMENT:  CLINICAL IMPRESSION: Pt is progressing with ROM and  strength, still has pain. Has met sit to stand and stair goal, and is walking without AD. Her flexion ROM is still limited but with PROM she has good available range. She reports that she cannot return for further therapy because her insurance is not paying for it. She will call and speak to them. While she gets this sorted out, we will put her on a two week hold in the event she does return. Patient does well with strengthening progression on machines today. I think she is doing well, and if she doesn't return encouraged her to keep working on strength and ROM at home and a gym.    Patient is a 68 y.o. female who was seen today for physical therapy evaluation and treatment for R TKA done on 09/15/23. She is walking with very flexed and antalgic gait over the walker. She needs a standing rest break walking from the lobby to treatment room. Pt reports some ongoing back issues which is why she is flexed at the trunk and uses the walker for support. Prior to surgery, she did not use an AD. She presents with increased swelling, pain, and gait  impairments at evaluation. Patient will benefit from skilled PT to address her R knee ROM and strength deficits to be able to normalize her gait pattern and return to PLOF. She does have transportation limits until she is able to drive on her own again.   OBJECTIVE IMPAIRMENTS: Abnormal gait, decreased activity tolerance, decreased balance, decreased endurance, difficulty walking, decreased ROM, decreased strength, obesity, and pain.   ACTIVITY LIMITATIONS: carrying, lifting, bending, sitting, standing, squatting, sleeping, stairs, transfers, toileting, and locomotion level  PARTICIPATION LIMITATIONS: meal prep, cleaning, laundry, driving, shopping, and community activity  PERSONAL FACTORS: Fitness and Transportation are also affecting patient's functional outcome.   REHAB POTENTIAL: Good  CLINICAL DECISION MAKING: Stable/uncomplicated  EVALUATION COMPLEXITY: Low  GOALS: Goals reviewed with patient? Yes   SHORT TERM GOALS: Target date: 11/05/23  Independent with initial HEP. Baseline:  Goal status: 09/29/23 MET  2.  Patient will demonstrate improved functional LE strength by completing 5x STS in <15 seconds.   Baseline: 18s with compensation, high mat table Goal status: 12s MET 11/04/23   LONG TERM GOALS: Target date: 12/17/23  Independent with advanced/ongoing HEP to improve outcomes and carryover.  Baseline:  Goal status: INITIAL  2.  Patient will demonstrate R knee flexion to 120 deg to ascend/descend stairs. Baseline: 82 Goal status: Progressing 10/08/23, 10/21/23, 98d 11/04/23  3.  Patient will demonstrate full R knee extension for safety with gait. Baseline: -20 Goal status: Progressing 10/08/23, 10/21/23, -2 11/04/23  4.  Patient will be able to ambulate 600' safely with LRAD and normal gait pattern to access community.  Baseline: using RW, very antalgic Goal status: IN PROGRESS mostly walking without AD 10/21/23  5.  Patient will be able to ascend/descend stairs  with 1 HR and reciprocal step pattern safely to access home and community.  Baseline:  Goal status: MET 11/04/23   PLAN:  PT FREQUENCY: 1-2x/week  PT DURATION: 12 weeks  PLANNED INTERVENTIONS: 97110-Therapeutic exercises, 97530- Therapeutic activity, 97112- Neuromuscular re-education, 97535- Self Care, [id number]- Manual therapy, [phone]- Gait training, [phone]- Vasopneumatic device, Patient/Family education, Balance training, Stair training, Joint mobilization, Cryotherapy, and Moist heat  PLAN FOR NEXT SESSION: PROM for knee flexion, strengthening    Almetta Fam, PT, DPT 11/04/2023, 10:12 AM

## 2023-11-04 ENCOUNTER — Ambulatory Visit

## 2023-11-04 DIAGNOSIS — Z96651 Presence of right artificial knee joint: Secondary | ICD-10-CM | POA: Diagnosis not present

## 2023-11-04 DIAGNOSIS — R2689 Other abnormalities of gait and mobility: Secondary | ICD-10-CM

## 2023-11-04 DIAGNOSIS — M6281 Muscle weakness (generalized): Secondary | ICD-10-CM

## 2023-12-01 LAB — COLOGUARD: COLOGUARD: NEGATIVE

## 2023-12-11 ENCOUNTER — Other Ambulatory Visit: Payer: Self-pay | Admitting: Family Medicine

## 2023-12-11 DIAGNOSIS — R928 Other abnormal and inconclusive findings on diagnostic imaging of breast: Secondary | ICD-10-CM

## 2023-12-16 ENCOUNTER — Other Ambulatory Visit: Payer: Self-pay | Admitting: Family Medicine

## 2023-12-16 ENCOUNTER — Inpatient Hospital Stay
Admission: RE | Admit: 2023-12-16 | Discharge: 2023-12-16 | Payer: PRIVATE HEALTH INSURANCE | Attending: Family Medicine | Admitting: Family Medicine

## 2023-12-16 DIAGNOSIS — N6321 Unspecified lump in the left breast, upper outer quadrant: Secondary | ICD-10-CM

## 2023-12-16 DIAGNOSIS — R928 Other abnormal and inconclusive findings on diagnostic imaging of breast: Secondary | ICD-10-CM

## 2023-12-18 ENCOUNTER — Ambulatory Visit
Admission: RE | Admit: 2023-12-18 | Discharge: 2023-12-18 | Disposition: A | Source: Ambulatory Visit | Attending: Family Medicine | Admitting: Family Medicine

## 2023-12-18 DIAGNOSIS — N6321 Unspecified lump in the left breast, upper outer quadrant: Secondary | ICD-10-CM

## 2023-12-18 HISTORY — PX: BREAST BIOPSY: SHX20

## 2023-12-21 LAB — SURGICAL PATHOLOGY

## 2023-12-25 NOTE — Progress Notes (Signed)
 " NEW PATIENT CLINIC NOTE - LOW BACK PAIN    SUBJECTIVE Taylor Silva is a 68 y.o. female who presents to clinic with low back pain.   History of Present Illness The patient is a 68 year old female who presents for evaluation of back pain, hip pain, and depression.   She reports experiencing pain in her back and hips, which significantly impedes her ability to exercise and even walk around a store. The pain occasionally radiates down her legs, accompanied by numbness in her left buttock and all toes of both feet. She has been informed that her 5th and 6th vertebrae are impinging on a nerve. The back pain is described as a large knot extending to her tailbone on both sides, with discomfort when leaning backwards but not forwards. No recent MRIs have been performed. She has not engaged in physical therapy for her back, as she was advised to rest it. She has been using a TENS unit for symptom management. She has previously tried Flexeril without success and currently takes tizanidine  as needed for leg tension.  She has been diagnosed with hip bursitis. She recalls an incident at work years ago where she stepped into a small depression in a cement floor, causing her hip to dislocate. She was informed that this could have been due to tendon issues. This has occurred bilaterally, and she now fears overexertion. She has completed physical therapy for her knee following a total knee replacement approximately 8 weeks ago and was advised that further therapy was unnecessary.  She is experiencing symptoms of depression and is currently on fluoxetine  20 mg for treatment.  She has been told that she cannot have dental work without taking antibiotics. She has had a plate and pins inserted, which remain tender. She requires a pad when wearing regular shoes. All of her joints have started hurting, including her fingers. She has not seen a rheumatologist yet, but she is going to be referred to one because blood  tests for rheumatoid arthritis were negative.  PAST SURGICAL HISTORY: Total knee replacement approximately 8 weeks ago. Plate and pins insertion.  Patient denies fevers, chills, loss of bowel and bladder control, saddle anesthesia, unintentional weight loss, night sweats.    OBJECTIVE  There were no vitals filed for this visit.   Physical Exam   Inspection: No erythema, edema, or ecchymosis noted over the lower lumbar spine Palpation: Tenderness to palpation over the bilateral lumbosacral paraspinals and gluteals Range of Motion:  -- Flexion and extension limited by 50% with pain at end ranges.  Pain with facet loading Strength: -- RLE: Hip Flexion = 5/5, Knee Extension = 5/5, Dorsiflexion = 5/5, Great Toe Extension = 5/5, Plantarflexion = 5/5 -- LLE: Hip Flexion = 5/5, Knee Extension = 5/5, Dorsiflexion = 5/5, Great Toe Extension = 5/5, Plantarflexion = 5/5 Sensation:  -- Intact to light touch in the bilateral L2-S2 dermatomes, decreased over right lateral ankle due to previous ORIF and over right knee due to recent right total knee replacement Special Tests: -- Negative Straight Leg Raise Test bilaterally   Imaging: Lumbar spine X-Rays (11/20/23):  Vertebral body heights are preserved. Disc spaces are preserved. There are 6 lumbar-type vertebrae.  There is a small anterolisthesis of L4 on L5 which is similar to previous.  Mild diffuse lumbar spondylosis noted. Lumbar MRI 11/24/23: It is assumed that there are 5 nonrib-bearing lumbar-type vertebrae with lumbarization of S1. The conus medullaris terminates at a normal level. Grade 1 retrolisthesis of L3 on  L4. Grade 1 anterolisthesis of L5 on S1. The vertebral body heights are maintained. Mild/moderate and L5-S1 intervertebral disc height loss with mild height loss at the remaining levels.  L3-4: No significant spinal canal stenosis. Small disc bulge. Mild bilateral neural foraminal stenosis. L4-5: No significant spinal canal or  neural foraminal stenosis. L5-S1: Anterolisthesis and facet arthrosis contribute to mild left neural foraminal stenosis. No significant spinal canal stenosis.  Results    ASSESSMENT and PLAN: Taylor Silva is a 68 y.o. female who presents to clinic with worsening chronic low back pain radiating out to the buttocks and lateral hips limiting her ability to stand and walk.  Case complicated by recent right total knee replacement.. We had a good discussion regarding the treatment plan.  Assessment & Plan 1. Back pain: - Pain radiates down the legs and causes numbness in the left buttock and toes. - X-ray results show no significant changes compared to previous findings, L4-5 spondylolisthesis and 6 lumbar type vertebrae. - MRI findings as above which correlates with L L5 radicular symptoms due to degenerative changes and spondylolisthesis.  -Tizanidine  was not taken. - Plan for L L5 snrb.  2. 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 - Hip X-ray normal - Aquatic exercises are recommended to strengthen muscles and improve mobility.   3. Depression: - Currently taking fluoxetine  20 mg for depression.  4. Breast Cancer - newly diagnosed from mammogram/MRI.   Follow-up: The patient will follow up as scheduled.   Computer technology was used to create visit note. Consent from the patient/care giver was obtained prior to its use.  Documentation for time-based billing:  Total time spent of date of service was 25 minutes.  Patient care activities included preparing to see the patient such as reviewing the patient record, obtaining and/or reviewing separately obtained history, performing a medically appropriate history and physical examination, counseling and educating the patient, family, and/or caregiver, ordering prescription medications, tests, or procedures, and documenting clinical information in the electronic or other health record.  "

## 2023-12-28 ENCOUNTER — Ambulatory Visit: Payer: Self-pay | Admitting: Surgery

## 2023-12-28 DIAGNOSIS — C50912 Malignant neoplasm of unspecified site of left female breast: Secondary | ICD-10-CM

## 2023-12-28 NOTE — Progress Notes (Signed)
 "   REFERRING PHYSICIAN:  Delayne Artist PARAS, MD PROVIDER:  DEBBY CURTISTINE SHIPPER, MD MRN: [id number] DOB: 1955-03-26 DATE OF ENCOUNTER: 12/28/2023 Subjective    Chief Complaint: New Consultation ( NEW BREAST CANCER -)   History of Present Illness: Taylor Silva is a 68 y.o. female who is seen today as an office consultation for evaluation of New Consultation ( NEW BREAST CANCER -)   The patient is seen today for newly diagnosed left breast cancer.  She had a 1.3 cm mass left breast upper outer quadrant measuring 1.3 cm core biopsy proven to be ER positive, PR positive, HER2/neu negative, KI 15%.  No history of breast pain, breast mass or nipple discharge.  She had a grandmother had breast cancer.    Review of Systems: A complete review of systems was obtained from the patient.  I have reviewed this information and discussed as appropriate with the patient.  See HPI as well for other ROS.     Medical History: Past Medical History:  Diagnosis Date   Anxiety    Arthritis    Asthma, unspecified asthma severity, unspecified whether complicated, unspecified whether persistent (HHS-HCC)    History of cancer     There is no problem list on file for this patient.   History reviewed. No pertinent surgical history.   Allergies  Allergen Reactions   Codeine Anaphylaxis, Other (See Comments) and Unknown    Passed out  fainting   Penicillins Hives, Other (See Comments) and Rash    Has patient had a PCN reaction causing immediate rash, facial/tongue/throat swelling, SOB or lightheadedness with hypotension: Yes  Has patient had a PCN reaction causing severe rash involving mucus membranes or skin necrosis: No  Has patient had a PCN reaction that required hospitalization No  Has patient had a PCN reaction occurring within the last 10 years: No  If all of the above answers are NO, then may proceed with Cephalosporin use.  Tolerated Cephalosporin Date: 09/16/23.   Sulfa  (Sulfonamide Antibiotics) Hives and Other (See Comments)    Current Outpatient Medications on File Prior to Visit  Medication Sig Dispense Refill   gabapentin  (NEURONTIN ) 600 MG tablet      rosuvastatin  (CRESTOR ) 40 MG tablet Take 40 mg by mouth once daily     fluoxetine  HCl (PROZAC  ORAL)      No current facility-administered medications on file prior to visit.    Family History  Problem Relation Age of Onset   Stroke Mother    Obesity Mother    High blood pressure (Hypertension) Mother    Hyperlipidemia (Elevated cholesterol) Mother    Coronary Artery Disease (Blocked arteries around heart) Mother    Diabetes Mother    High blood pressure (Hypertension) Sister    Hyperlipidemia (Elevated cholesterol) Sister    Diabetes Sister    Diabetes Brother    High blood pressure (Hypertension) Brother    Hyperlipidemia (Elevated cholesterol) Brother      Social History   Tobacco Use  Smoking Status Never  Smokeless Tobacco Never     Social History   Socioeconomic History   Marital status: Widowed  Tobacco Use   Smoking status: Never   Smokeless tobacco: Never  Substance and Sexual Activity   Drug use: Never   Social Drivers of Health   Food Insecurity: No Food Insecurity (09/15/2023)   Received from [facility]   Hunger Vital Sign    Within the past 12 months, you worried that your food would run  out before you got the money to buy more.: Never true    Within the past 12 months, the food you bought just didn't last and you didn't have money to get more.: Never true  Transportation Needs: No Transportation Needs (09/15/2023)   Received from [facility] - Transportation    In the past 12 months, has lack of transportation kept you from medical appointments or from getting medications?: No    In the past 12 months, has lack of transportation kept you from meetings, work, or from getting things needed for daily living?: No  Social Connections:  Patient Declined (09/15/2023)   Received from [facility]   Social Connection and Isolation Panel    In a typical week, how many times do you talk on the phone with family, friends, or neighbors?: Patient declined    How often do you get together with friends or relatives?: Patient declined    How often do you attend church or religious services?: Patient declined    Do you belong to any clubs or organizations such as church groups, unions, fraternal or athletic groups, or school groups?: Patient declined    How often do you attend meetings of the clubs or organizations you belong to?: Patient declined    Are you married, widowed, divorced, separated, never married, or living with a partner?: Patient declined  Housing Stability: Unknown (12/28/2023)   Housing Stability Vital Sign    Homeless in the Last Year: No    Objective:   Vitals:   12/28/23 1024  BP: 116/80  Pulse: 75  Temp: 36.7 C (98 F)  SpO2: 97%  Weight: (!) 110.7 kg (244 lb)  Height: 165.1 cm (5' 5)    Body mass index is 40.6 kg/m.  Physical Exam Exam conducted with a chaperone present.  Cardiovascular:     Rate and Rhythm: Normal rate.  Pulmonary:     Effort: Pulmonary effort is normal.  Chest:  Breasts:    Right: Normal. No mass or tenderness.     Left: Normal. No mass or tenderness.  Musculoskeletal:     Cervical back: Normal range of motion.  Lymphadenopathy:     Upper Body:     Right upper body: No axillary adenopathy.     Left upper body: No axillary adenopathy.  Skin:    General: Skin is warm.  Neurological:     General: No focal deficit present.        Labs, Imaging and Diagnostic Testing:  FINAL DIAGNOSIS       1. Breast, left, needle core biopsy, 2:00 3cmfn (ribbon clip) :      INVASIVE DUCTAL CARCINOMA      TUBULE FORMATION: SCORE 3      NUCLEAR PLEOMORPHISM: SCORE 3      MITOTIC COUNT: SCORE 1      TOTAL SCORE: 7      OVERALL GRADE: 2      LYMPHOVASCULAR INVASION:  IDENTIFIED      CANCER LENGTH: 11 MM / 1.1 CM      CALCIFICATIONS: NOT IDENTIFIED      OTHER FINDINGS:NONE      SEE NOTE       Diagnosis Note : Dr. Belvie reviewed the case and concurs with the      interpretation.  A breast prognostic profile (ER and PR) is pending and will be      reported in an addendum. [facility] was notified on  12/21/2023.      DATE SIGNED OUT: 12/21/2023 ELECTRONIC SIGNATURE : Pepper Dutton Md, Pathologist, Electronic Signature  MICROSCOPIC DESCRIPTION  CASE COMMENTS STAINS USED IN DIAGNOSIS: H&E-2 H&E-3 H&E-4 H&E *RECUT 1 SLIDE Stains used in diagnosis 1 Her2 by IHC, 1 ER-ACIS, 1 KI-67-ACIS, 1 PR-ACIS IHC scores are reported using ASCO/CAP scoring criteria.  An IHC Score of 0 or 1+  is NEGATIVE for HER2, 3+ is POSITIVE for HER2, and 2+ is EQUIVOCAL. Equivocal results are reflexed to either FISH or IHC testing. Specimens are fixed in 10% Neutral Buffered Formalin for at least 6 hours and up to 72 hours. These tests have not be validated on decalcified tissue.  Results should be interpreted with caution given the possibility of false negative results on decalcified specimens. Antibody Clone for HER2 is 4B5 (PATHWAY). Some of these immunohistochemical stains may have been developed and the performance characteristics determined by [facility].  Some may not have been cleared or approved by the U.S. Food and Drug Administration.  The FDA has determined that such clearance or approval is not necessary.  This test is used for clinical purposes.  It should not be regarded as investigational or for research.  This laboratory is certified under the Clinical Laboratory Improvement Amendments of 1988 (CLIA-88) as qualified to perform high complexity clinical laboratory testing. Estrogen receptor (6F11), immunohistochemical stains are performed on formalin fixed, paraffin embedded tissue using a 3,3-diaminobenzidine  (DAB) chromogen and Leica Bond Autostainer System.  The staining intensity of the nucleus is scored manually and is reported as the percentage of tumor cell nuclei demonstrating specific nuclear staining.Specimens are fixed in 10% Neutral Buffered Formalin for at least 6 hours and up to 72 hours.  These tests have not be validated on decalcified tissue.  Results should be interpreted with caution given the possibility of false negative results on decalcified specimens. Ki-67 (MM1), immunohistochemical stains are performed on formalin fixed, paraffin embedded tissue using a 3,3-diaminobenzidine (DAB) chromogen and Leica Bond Autostainer System.  The staining intensity of the nucleus is scored manually and is reported as the percentage of tumor cell nuclei demonstrating specific nuclear staining.Specimens are fixed in 10% Neutral Buffered Formalin for at least 6 hours and up to 72 hours. These tests have not be validated on decalcified tissue.  Results should be interpreted with caution given the possibility of false negative results on decalcified specimens. PR progesterone receptor (16), immunohistochemical stains are performed on formalin fixed, paraffin embedded tissue using a 3,3-diaminobenzidine (DAB) chromogen and Leica Bond Autostainer System.  The staining intensity of the nucleus is scored manually and is reported as the percentage of tumor cell nuclei demonstrating specific nuclear staining.Specimens are fixed in 10% Neutral Buffered Formalin for at least 6 hours and up to 72 hours. These tests have not be validated on decalcified tissue.  Results should be interpreted with caution given the possibility of false negative results on decalcified specimens.  ADDENDUM Breast, left, needle core biopsy PROGNOSTIC INDICATORS Results: IMMUNOHISTOCHEMICAL AND MORPHOMETRIC ANALYSIS PERFORMED MANUALLY The tumor cells are negative for Her2 (1+). Estrogen Receptor:  99%, positive, strong  staining intensity Progesterone Receptor:  40%, positive, moderate to strong staining intensity Proliferation Marker Ki67: 15% COMMENT:  The negative hormone receptor study(ies) in this case has an internal positive control.  REFERENCE RANGE ESTROGEN RECEPTOR NEGATIVE     0% POSITIVE       =>1% REFERENCE RANGE PROGESTERONE RECEPTOR NEGATIVE     0% POSITIVE        =>  1% All controls stained appropriately Pepper Dutton Md, Pathologist, Electronic Signature ( Signed [phone] 2025)   CLINICAL HISTORY  SPECIMEN(S) OBTAINED 1. Breast, left, needle core biopsy, 2:00 3cmfn (ribbon Clip)  SPECIMEN COMMENTS: 1. TIF: 13:50pm, CIT: < 1 min SPECIMEN CLINICAL INFORMATION: 1. 68 yr old woman with 1.3cm highly suspicious left breast mass    Gross Description 1. Received in formalin labeled Taylor Silva,Taylor Silva and left breast 2 o'clock are three yellow cylindrical cores of fibroadipose tissue ranging from 1.0 x 0.2 x 0.2 cm to 1.3 x 0.2 x 0.2 cm, entirely submitted in one block.      TIF 1350, CIT less than 1 min.  (BP:gt, 12/21/23)        Report signed out from the following location(s) [facility] [address] [place], [place] [address] CLIA #: [id number] CLINICAL DATA:  LEFT breast callback   EXAM: DIGITAL DIAGNOSTIC UNILATERAL LEFT MAMMOGRAM WITH TOMOSYNTHESIS AND CAD; ULTRASOUND LEFT BREAST LIMITED   TECHNIQUE: Left digital diagnostic mammography and breast tomosynthesis was performed. The images were evaluated with computer-aided detection. ; Targeted ultrasound examination of the left breast was performed.   COMPARISON:  Previous exam(s).   ACR Breast Density Category b: There are scattered areas of fibroglandular density.   FINDINGS: Spot compression tomosynthesis views demonstrates a persistent irregular mass with subtle associated distortion.   On physical exam, there is a firm mass in the upper outer breast.   Targeted ultrasound was performed of  the LEFT breast. At 2 o'clock 3 cm from the nipple, there is an irregular hypoechoic mass with irregular margins. This measures 13 x 12 x 11 mm. This corresponds to the site of mammographic concern.   Targeted ultrasound was performed of the LEFT axilla. No suspicious axillary lymph nodes are visualized.   IMPRESSION: 1. There is suspicious 1.3 cm mass at the site of screening mammographic concern. Recommend ultrasound-guided biopsy for definitive characterization. 2. No suspicious LEFT axillary adenopathy.   RECOMMENDATION: LEFT breast ultrasound-guided biopsy x1   I have discussed the findings and recommendations with the patient. The biopsy procedure was discussed with the patient and questions were answered. Patient expressed their understanding of the biopsy recommendation. Patient will be scheduled for biopsy at her earliest convenience by the schedulers. Ordering provider will be notified. If applicable, a reminder letter will be sent to the patient regarding the next appointment.   BI-RADS CATEGORY  5: Highly suggestive of malignancy.     Electronically Signed   By: Corean Salter M.D.   On: 12/16/2023 10:44 Assessment and Plan:     Diagnoses and all orders for this visit:  Breast cancer, stage 1, estrogen receptor positive, left (CMS/HHS-HCC) -     Ambulatory Referral to Oncology-Medical -     Ambulatory Referral to Radiation Oncology    Reviewed breast conserving surgery versus mastectomy reconstruction.  Local regional recurrence, overall survival and disease-free recurrence were reviewed with each operation.  She has opted for left breast seed lumpectomy with left axillary sentinel lymph node mapping.  Refer to medical and radiation oncology  Discussed the risk of lymphedema and shoulder stiffness.   The procedure has been discussed with the patient. Alternatives to surgery have been discussed with the patient.  Risks of surgery include bleeding,   Infection,  Seroma formation, death,  and the need for further surgery.   The patient understands and wishes to proceed.    DEBBY CURTISTINE SHIPPER, MD    I spent a total  of 49 minutes in both face-to-face and non-face-to-face activities, excluding procedures performed, for this visit on the date of this encounter.      "

## 2023-12-29 ENCOUNTER — Other Ambulatory Visit: Payer: Self-pay | Admitting: Surgery

## 2023-12-29 DIAGNOSIS — C50912 Malignant neoplasm of unspecified site of left female breast: Secondary | ICD-10-CM

## 2024-01-06 ENCOUNTER — Other Ambulatory Visit: Payer: Self-pay | Admitting: Nurse Practitioner

## 2024-01-06 DIAGNOSIS — C50919 Malignant neoplasm of unspecified site of unspecified female breast: Secondary | ICD-10-CM

## 2024-01-06 DIAGNOSIS — Z90710 Acquired absence of both cervix and uterus: Secondary | ICD-10-CM

## 2024-01-06 DIAGNOSIS — R140 Abdominal distension (gaseous): Secondary | ICD-10-CM

## 2024-01-19 ENCOUNTER — Ambulatory Visit
Admission: RE | Admit: 2024-01-19 | Discharge: 2024-01-19 | Disposition: A | Source: Ambulatory Visit | Attending: Nurse Practitioner

## 2024-01-19 DIAGNOSIS — C50919 Malignant neoplasm of unspecified site of unspecified female breast: Secondary | ICD-10-CM

## 2024-01-19 DIAGNOSIS — Z90710 Acquired absence of both cervix and uterus: Secondary | ICD-10-CM

## 2024-01-19 DIAGNOSIS — R140 Abdominal distension (gaseous): Secondary | ICD-10-CM

## 2024-01-20 ENCOUNTER — Other Ambulatory Visit: Payer: Self-pay

## 2024-01-20 ENCOUNTER — Encounter (HOSPITAL_BASED_OUTPATIENT_CLINIC_OR_DEPARTMENT_OTHER): Payer: Self-pay | Admitting: Surgery

## 2024-01-21 NOTE — Progress Notes (Signed)
 Location of Breast Cancer: Invasive Ductal Carcinoma of left breast.   Histology per Pathology Report:    Receptor Status: ER(99), PR (40), Her2-neu (), Ki-67(15)    Past/Anticipated interventions by surgeon, if any: Dr. Vanderbilt . Surgery date 01/28/24    Past/Anticipated interventions by medical oncology, if any: Patient has appointment with Dr. Tressia on 01/26/24   Lymphedema issues, if any: Denies     Pain issues, if any: She reports a little tenderness at the biopsy site.    SAFETY ISSUES: Prior radiation? No Pacemaker/ICD? No Possible current pregnancy? Hysterectomy Is the patient on methotrexate? No  Current Complaints / other details:

## 2024-01-24 NOTE — Progress Notes (Signed)
 " Radiation Oncology         [phone] ________________________________  Initial Outpatient Consultation  Name: Taylor Silva MRN: [id number]  Date: 01/25/2024  DOB: 1955-10-12  RR:Fjiizw, Artist PARAS, MD  Vanderbilt Ned, MD   REFERRING PHYSICIAN: Vanderbilt Ned, MD  DIAGNOSIS: There were no encounter diagnoses.  Stage *** (***) Left Breast UOQ, Invasive Ductal Carcinoma, ER+ / PR+ / Her2-, Grade 2, positive for LVI  HISTORY OF PRESENT ILLNESS::Taylor Silva is a 69 y.o. female who is accompanied by ***. she is seen as a courtesy of Dr. Vanderbilt for an opinion concerning radiation therapy as part of management for her recently diagnosed left breast cancer.   The patient presented for a routine bilateral screening mammogram on 11/19/23 that showed a possible abnormality in the left breast. A left breast diagnostic mammogram and left breast ultrasound was accordingly performed on 12/16/23 that further revealed: a suspicious 1.3 cm mass in the 2 o'clock left breast located 3 cmfn. No abnormal appearing left axillary lymph nodes or other abnormalities in the left breast were demonstrated.   Biopsy of the 2 o'clock left breast mass on 12/18/23 showed grade 2 invasive ductal carcinoma measuring 1.1 cm in the greatest linear extent of the sample; positive for LVI. Prognostic indicators significant for: estrogen receptor 99% positive with strong staining intensity; progesterone receptor 40% positive with moderate to strong staining intensity; Proliferation marker Ki67 at 15%; Her2 status negative; Grade 2.   She was seen in consultation by Dr. Vanderbilt on 12/28/23 to discuss treatment options. Based on her discussion with Dr. Vanderbilt, she has opted to proceed with breast conserving surgery and left axillary SLN mapping. Her procedure is currently scheduled for 01/28/24.   She is also scheduled to meet with Dr. Loretha in consultation tomorrow (01/26/24) to discuss additional treatment options.    Of note: She does have a family history of breast cancer in a grandmother.   ***  PREVIOUS RADIATION THERAPY: No  PAST MEDICAL HISTORY:  Past Medical History:  Diagnosis Date   Anxiety    Arthritis    Asthma    Depression    DJD (degenerative joint disease), lumbar    Fructose intolerance    GERD (gastroesophageal reflux disease)    H/O hiatal hernia    Hard of hearing    bilat    Headache    migraines   Heart murmur    echo done 80's neg   History of bronchitis    History of frequent urinary tract infections    History of vertigo    Hyperlipidemia    Hypertension    currently taking no medications    Obesity    Pinched nerve    in back   PONV (postoperative nausea and vomiting)    patch used last time   Pre-diabetes    Restless legs    Seasonal allergies    Sleep apnea    Intolerant to CPAP last sleep study   Tinnitus    Wears glasses     PAST SURGICAL HISTORY: Past Surgical History:  Procedure Laterality Date   ABDOMINAL HYSTERECTOMY  1995   No oophorectomy   BREAST BIOPSY Left 12/18/2023   US  LT BREAST BX W LOC DEV 1ST LESION IMG BX SPEC US  GUIDE 12/18/2023 [facility]   BREATH TEK H PYLORI N/A 06/23/2014   Procedure: BREATH TEK H PYLORI;  Surgeon: Camellia Blush, MD;  Location: [facility];  Service: General;  Laterality: N/A;   CHOLECYSTECTOMY  N/A 07/12/2015   Procedure: LAPAROSCOPIC CHOLECYSTECTOMY WITH INTRAOPERATIVE CHOLANGIOGRAM;  Surgeon: Camellia Blush, MD;  Location: [facility];  Service: General;  Laterality: N/A;   DIAGNOSTIC LAPAROSCOPY     DILATION AND CURETTAGE OF UTERUS     KNEE SURGERY  04/2009   right knee, meniscus tear   LAPAROSCOPIC GASTRIC SLEEVE RESECTION WITH HIATAL HERNIA REPAIR N/A 10/03/2014   Procedure: LAPAROSCOPIC GASTRIC SLEEVE RESECTION WITH HIATAL HERNIA REPAIR W UPPER ENDO;  Surgeon: Camellia Blush, MD;  Location: [facility];  Service: General;  Laterality: N/A;   ORIF ANKLE FRACTURE Right 10/17/2013   Procedure: OPEN REDUCTION  INTERNAL FIXATION (ORIF) RIGHT ANKLE DISTAL FIBULA/LATERAL MALLEOLUS;  Surgeon: Marcey Raman, MD;  Location: [facility];  Service: Orthopedics;  Laterality: Right;   OTHER SURGICAL HISTORY  1995?   removed scar tissue from colon   thigh tuck Bilateral    inner thigh tuck   TOTAL KNEE ARTHROPLASTY Right 09/15/2023   Procedure: ARTHROPLASTY, KNEE, TOTAL;  Surgeon: Ernie Cough, MD;  Location: [facility];  Service: Orthopedics;  Laterality: Right;   UPPER GI ENDOSCOPY N/A 10/03/2014   Procedure: UPPER GI ENDOSCOPY;  Surgeon: Camellia Blush, MD;  Location: [facility];  Service: General;  Laterality: N/A;   WRIST SURGERY  1980s   cyst removed from right wrist    FAMILY HISTORY:  Family History  Problem Relation Age of Onset   Diabetes Mother    Hyperlipidemia Mother    Hypertension Mother    Heart attack Mother        53 y/o   Heart disease Father    Leukemia Father    Diabetes Sister    Hyperlipidemia Sister    Hypertension Sister    Diabetes Maternal Grandmother    Hyperlipidemia Maternal Grandmother    Hypertension Maternal Grandmother    Breast cancer Paternal Grandmother    Diabetes Brother    Hyperlipidemia Brother    Hypertension Brother    Heart attack Brother    Breast cancer Other        GM   Colon cancer Neg Hx     SOCIAL HISTORY: Social History[1]  ALLERGIES: Allergies[2]  MEDICATIONS:  Current Outpatient Medications  Medication Sig Dispense Refill   acetaminophen  (TYLENOL ) 500 MG tablet Take 2 tablets (1,000 mg total) by mouth every 6 (six) hours.     Albuterol  Sulfate (PROAIR  RESPICLICK) 108 (90 Base) MCG/ACT AEPB Inhale 2 puffs into the lungs 4 (four) times daily as needed. 1 each 3   Calcium  Citrate-Vitamin D  (CALCIUM  CITRATE+D3 PETITES PO) Take 4 tablets by mouth daily.     FLUoxetine  (PROZAC ) 20 MG capsule Take 20 mg by mouth daily.     FLUoxetine  (PROZAC ) 40 MG capsule Take 1 capsule (40 mg total) by mouth daily. 30 capsule 5   gabapentin  (NEURONTIN ) 600 MG tablet TAKE 1  TABLET BY MOUTH THREE TIMES DAILY 90 tablet 0   hydrOXYzine  (ATARAX ) 25 MG tablet TAKE ONE TO TWO TABLETS BY MOUTH DURING THE DAY AND ONE TO TWO TABLETS AT BEDTIME (Patient taking differently: Take 25-50 mg by mouth at bedtime as needed (sleep).) 120 tablet 0   Krill Oil 500 MG CAPS Take 500 mg by mouth daily.     methocarbamol  (ROBAXIN ) 500 MG tablet Take 1 tablet (500 mg total) by mouth every 6 (six) hours as needed for muscle spasms. 40 tablet 2   montelukast  (SINGULAIR ) 10 MG tablet Take 1 tablet (10 mg total) by mouth at bedtime as needed. 90 tablet 1  Pediatric Multivit-Minerals (FLINTSTONES COMPLETE PO) Take 2 tablets by mouth daily.     polyethylene glycol powder (GLYCOLAX /MIRALAX ) 17 GM/SCOOP powder Take 17 g dissolved in liquid by mouth 2 (two) times daily. 238 g 0   rosuvastatin  (CRESTOR ) 40 MG tablet Take 40 mg by mouth daily.     traMADol  (ULTRAM ) 50 MG tablet Take 1 tablet (50 mg total) by mouth every 6 (six) hours as needed for moderate pain (pain score 4-6) or severe pain (pain score 7-10). 30 tablet 0   No current facility-administered medications for this encounter.    REVIEW OF SYSTEMS:  A 10+ POINT REVIEW OF SYSTEMS WAS OBTAINED including neurology, dermatology, psychiatry, cardiac, respiratory, lymph, extremities, GI, GU, musculoskeletal, constitutional, reproductive, HEENT. ***   PHYSICAL EXAM:  vitals were not taken for this visit.   General: Alert and oriented, in no acute distress HEENT: Head is normocephalic. Extraocular movements are intact. Oropharynx is clear. Neck: Neck is supple, no palpable cervical or supraclavicular lymphadenopathy. Heart: Regular in rate and rhythm with no murmurs, rubs, or gallops. Chest: Clear to auscultation bilaterally, with no rhonchi, wheezes, or rales. Abdomen: Soft, nontender, nondistended, with no rigidity or guarding. Extremities: No cyanosis or edema. Lymphatics: see Neck Exam Skin: No concerning lesions. Musculoskeletal:  symmetric strength and muscle tone throughout. Neurologic: Cranial nerves II through XII are grossly intact. No obvious focalities. Speech is fluent. Coordination is intact. Psychiatric: Judgment and insight are intact. Affect is appropriate.  Right Breast: no palpable mass, nipple discharge or bleeding. Left Breast: ***  ECOG = ***  0 - Asymptomatic (Fully active, able to carry on all predisease activities without restriction)  1 - Symptomatic but completely ambulatory (Restricted in physically strenuous activity but ambulatory and able to carry out work of a light or sedentary nature. For example, light housework, office work)  2 - Symptomatic, <50% in bed during the day (Ambulatory and capable of all self care but unable to carry out any work activities. Up and about more than 50% of waking hours)  3 - Symptomatic, >50% in bed, but not bedbound (Capable of only limited self-care, confined to bed or chair 50% or more of waking hours)  4 - Bedbound (Completely disabled. Cannot carry on any self-care. Totally confined to bed or chair)  5 - Death   Raylene MM, Creech RH, Tormey DC, et al. [phone]). Toxicity and response criteria of the [facility] [employer]. Am. DOROTHA Bridges. Oncol. 5 (6): 649-55  LABORATORY DATA:  Lab Results  Component Value Date   WBC 9.9 09/16/2023   HGB 11.3 (L) 09/16/2023   HCT 35.6 (L) 09/16/2023   MCV 95.7 09/16/2023   PLT 156 09/16/2023   NEUTROABS 5.5 07/27/2022   Lab Results  Component Value Date   NA 137 09/16/2023   K 4.5 09/16/2023   CL 105 09/16/2023   CO2 23 09/16/2023   GLUCOSE 236 (H) 09/16/2023   BUN 16 09/16/2023   CREATININE 0.95 09/16/2023   CALCIUM  8.5 (L) 09/16/2023      RADIOGRAPHY: No results found.    IMPRESSION: Stage *** (***) Left Breast UOQ, Invasive Ductal Carcinoma, ER+ / PR+ / Her2-, Grade 2, positive for LVI  ***  Today, I talked to the patient and family about the findings and work-up thus far.  We discussed  the natural history of *** and general treatment, highlighting the role of radiotherapy in the management.  We discussed the available radiation techniques, and focused on the details of logistics and  delivery.  We reviewed the anticipated acute and late sequelae associated with radiation in this setting.  The patient was encouraged to ask questions that I answered to the best of my ability. *** A patient consent form was discussed and signed.  We retained a copy for our records.  The patient would like to proceed with radiation and will be scheduled for CT simulation.  PLAN: ***    *** minutes of total time was spent for this patient encounter, including preparation, face-to-face counseling with the patient and coordination of care, physical exam, and documentation of the encounter.   ------------------------------------------------  Lynwood CHARM Nasuti, PhD, MD  This document serves as a record of services personally performed by Lynwood Nasuti, MD. It was created on his behalf by Dorthy Fuse, a trained medical scribe. The creation of this record is based on the scribe's personal observations and the provider's statements to them. This document has been checked and approved by the attending provider.     [1]  Social History Tobacco Use   Smoking status: Never   Smokeless tobacco: Never  Vaping Use   Vaping status: Never Used  Substance Use Topics   Alcohol use: No   Drug use: No  [2]  Allergies Allergen Reactions   Codeine Anaphylaxis   Erythromycin Hives   Tetracyclines & Related Hives   Darvon [Propoxyphene] Photosensitivity    Blurry vision for 3 days   Morphine And Codeine Dermatitis    burning in veins   Other Other (See Comments)    Tomatoes upset stomach, itchy     Penicillins Hives    Has patient had a PCN reaction causing immediate rash, facial/tongue/throat swelling, SOB or lightheadedness with hypotension: Yes Has patient had a PCN reaction causing severe rash  involving mucus membranes or skin necrosis: No Has patient had a PCN reaction that required hospitalization No Has patient had a PCN reaction occurring within the last 10 years: No If all of the above answers are NO, then may proceed with Cephalosporin use. Tolerated Cephalosporin Date: 09/16/23.     Sulfa Antibiotics Nausea And Vomiting   "

## 2024-01-25 ENCOUNTER — Encounter: Payer: Self-pay | Admitting: Radiation Oncology

## 2024-01-25 ENCOUNTER — Encounter: Payer: Self-pay | Admitting: *Deleted

## 2024-01-25 ENCOUNTER — Ambulatory Visit
Admission: RE | Admit: 2024-01-25 | Discharge: 2024-01-25 | Disposition: A | Discharge status: Home / Self Care | Source: Ambulatory Visit | Attending: Radiation Oncology | Admitting: Radiation Oncology

## 2024-01-25 ENCOUNTER — Ambulatory Visit
Admission: RE | Admit: 2024-01-25 | Discharge: 2024-01-25 | Disposition: A | Source: Ambulatory Visit | Attending: Radiation Oncology | Admitting: Radiation Oncology

## 2024-01-25 VITALS — BP 122/83 | HR 77 | Temp 97.6°F | Resp 20 | Ht 66.0 in | Wt 242.6 lb

## 2024-01-25 DIAGNOSIS — J45909 Unspecified asthma, uncomplicated: Secondary | ICD-10-CM | POA: Diagnosis not present

## 2024-01-25 DIAGNOSIS — E669 Obesity, unspecified: Secondary | ICD-10-CM | POA: Diagnosis not present

## 2024-01-25 DIAGNOSIS — C50412 Malignant neoplasm of upper-outer quadrant of left female breast: Secondary | ICD-10-CM | POA: Diagnosis present

## 2024-01-25 DIAGNOSIS — E785 Hyperlipidemia, unspecified: Secondary | ICD-10-CM | POA: Diagnosis not present

## 2024-01-25 DIAGNOSIS — Z1732 Human epidermal growth factor receptor 2 negative status: Secondary | ICD-10-CM | POA: Diagnosis not present

## 2024-01-25 DIAGNOSIS — Z79899 Other long term (current) drug therapy: Secondary | ICD-10-CM | POA: Insufficient documentation

## 2024-01-25 DIAGNOSIS — M47896 Other spondylosis, lumbar region: Secondary | ICD-10-CM | POA: Diagnosis not present

## 2024-01-25 DIAGNOSIS — N644 Mastodynia: Secondary | ICD-10-CM | POA: Insufficient documentation

## 2024-01-25 DIAGNOSIS — K219 Gastro-esophageal reflux disease without esophagitis: Secondary | ICD-10-CM | POA: Insufficient documentation

## 2024-01-25 DIAGNOSIS — M129 Arthropathy, unspecified: Secondary | ICD-10-CM | POA: Insufficient documentation

## 2024-01-25 DIAGNOSIS — R011 Cardiac murmur, unspecified: Secondary | ICD-10-CM | POA: Insufficient documentation

## 2024-01-25 DIAGNOSIS — Z87440 Personal history of urinary (tract) infections: Secondary | ICD-10-CM | POA: Insufficient documentation

## 2024-01-25 DIAGNOSIS — Z800 Family history of malignant neoplasm of digestive organs: Secondary | ICD-10-CM | POA: Diagnosis not present

## 2024-01-25 DIAGNOSIS — G4730 Sleep apnea, unspecified: Secondary | ICD-10-CM | POA: Insufficient documentation

## 2024-01-25 DIAGNOSIS — Z1721 Progesterone receptor positive status: Secondary | ICD-10-CM | POA: Diagnosis not present

## 2024-01-25 DIAGNOSIS — I10 Essential (primary) hypertension: Secondary | ICD-10-CM | POA: Diagnosis not present

## 2024-01-25 DIAGNOSIS — Z803 Family history of malignant neoplasm of breast: Secondary | ICD-10-CM | POA: Diagnosis not present

## 2024-01-25 DIAGNOSIS — Z806 Family history of leukemia: Secondary | ICD-10-CM | POA: Diagnosis not present

## 2024-01-25 DIAGNOSIS — Z170 Estrogen receptor positive status [ER+]: Secondary | ICD-10-CM | POA: Insufficient documentation

## 2024-01-25 DIAGNOSIS — K449 Diaphragmatic hernia without obstruction or gangrene: Secondary | ICD-10-CM | POA: Insufficient documentation

## 2024-01-25 NOTE — Progress Notes (Signed)
 Per chart patient having SLN biopsy in surgery. Referral placed to Outpatient Physical Therapy for baseline SOZO screen.

## 2024-01-26 ENCOUNTER — Inpatient Hospital Stay: Attending: Hematology and Oncology | Admitting: Hematology and Oncology

## 2024-01-26 ENCOUNTER — Inpatient Hospital Stay
Admission: RE | Admit: 2024-01-26 | Discharge: 2024-01-26 | Disposition: A | Source: Ambulatory Visit | Attending: Surgery | Admitting: Surgery

## 2024-01-26 ENCOUNTER — Inpatient Hospital Stay

## 2024-01-26 VITALS — BP 115/74 | HR 83 | Temp 97.6°F | Resp 17 | Wt 241.9 lb

## 2024-01-26 DIAGNOSIS — C50412 Malignant neoplasm of upper-outer quadrant of left female breast: Secondary | ICD-10-CM | POA: Diagnosis not present

## 2024-01-26 DIAGNOSIS — Z170 Estrogen receptor positive status [ER+]: Secondary | ICD-10-CM | POA: Diagnosis not present

## 2024-01-26 DIAGNOSIS — C50912 Malignant neoplasm of unspecified site of left female breast: Secondary | ICD-10-CM

## 2024-01-26 HISTORY — PX: BREAST BIOPSY: SHX20

## 2024-01-26 MED ORDER — CHLORHEXIDINE GLUCONATE CLOTH 2 % EX PADS: 6.0000 | MEDICATED_PAD | Freq: Once | CUTANEOUS | Status: DC

## 2024-01-26 NOTE — Progress Notes (Signed)
 [facility] CONSULT NOTE  Patient Care Team: Delayne Artist PARAS, MD as PCP - General (Family Medicine) Tanda Locus, MD as Consulting Physician (General Surgery) Millicent Rush, MD as Referring Physician (Otolaryngology) Shari Sieving, MD as Consulting Physician (Orthopedic Surgery) Gerome Devere HERO, RN as Oncology Nurse Navigator  CHIEF COMPLAINTS/PURPOSE OF CONSULTATION:  Newly diagnosed breast cancer  HISTORY OF PRESENTING ILLNESS:  Taylor Silva 69 y.o. female is here because of recent diagnosis of left breast IDC  I reviewed her records extensively and collaborated the history with the patient.  SUMMARY OF ONCOLOGIC HISTORY: Oncology History  Malignant neoplasm of upper-outer quadrant of left breast in female, estrogen receptor positive (HCC)  01/25/2024 Initial Diagnosis   Malignant neoplasm of upper-outer quadrant of left breast in female, estrogen receptor positive (HCC)   01/25/2024 Cancer Staging   Staging form: Breast, AJCC 8th Edition - Clinical: Stage IA (cT1c, cN0, cM0, G2, ER+, PR+, HER2-) - Signed by Wyatt Leeroy HERO, PA-C on 01/25/2024 Stage prefix: Initial diagnosis Histologic grading system: 3 grade system     Discussed the use of AI scribe software for clinical note transcription with the patient, who gave verbal consent to proceed.  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  A 13 mm, non-palpable mass in the left breast at the two o'clock position, three centimeters from the nipple, was identified on screening mammogram. She was unable to detect the mass by self-exam prior to imaging. Core needle biopsy previously showed grade 2 invasive ductal carcinoma, estrogen and progesterone receptor positivit, her 2 neg. She is scheduled  for lumpectomy in two days. She expresses readiness for surgery and hopes for a smooth recovery.  She experiences significant joint pain and stiffness attributed to arthritis and bursitis, and recently underwent knee replacement for severe arthritis. She also has a pinched nerve in her back. She expresses concern regarding potential exacerbation of joint symptoms with future anti-estrogen therapy, noting current considerable joint discomfort. Her menopause was not particularly symptomatic, though she experiences significant sweating during summer months.  She has a painful mole on her left arm causing discomfort and irritation from adhesive dressings, and requests that the surgeon address this during her lumpectomy.  Obstetric history is notable for one child delivered at age 68 with breastfeeding. She used Provera and oral contraceptives for over twenty years due to amenorrhea, without subsequent hormone replacement therapy. Family history is significant for breast cancer in her paternal grandmother, kidney cancer in her mother, and leukemia in her father and brother.    MEDICAL HISTORY:  Past Medical History:  Diagnosis Date   Anxiety    Arthritis    Asthma    Depression    DJD (degenerative joint disease), lumbar    Fructose intolerance    GERD (gastroesophageal reflux disease)    H/O hiatal hernia    Hard of hearing    bilat    Headache    migraines   Heart murmur    echo done 80's neg   History of bronchitis    History of frequent urinary tract infections    History of vertigo    Hyperlipidemia    Hypertension    currently taking no medications    Obesity    Pinched nerve    in back   PONV (postoperative nausea and  vomiting)    patch used last time   Pre-diabetes    Restless legs    Seasonal allergies    Sleep apnea    Intolerant to CPAP last sleep study   Tinnitus    Wears glasses     SURGICAL HISTORY: Past Surgical History:  Procedure Laterality Date    ABDOMINAL HYSTERECTOMY  1995   No oophorectomy   BREAST BIOPSY Left 12/18/2023   US  LT BREAST BX W LOC DEV 1ST LESION IMG BX SPEC US  GUIDE 12/18/2023 [facility]   BREAST BIOPSY  01/26/2024   MM LT RADIOACTIVE SEED LOC MAMMO GUIDE 01/26/2024 [facility]   BREATH TEK H PYLORI N/A 06/23/2014   Procedure: BREATH TEK H PYLORI;  Surgeon: Camellia Blush, MD;  Location: [facility];  Service: General;  Laterality: N/A;   CHOLECYSTECTOMY N/A 07/12/2015   Procedure: LAPAROSCOPIC CHOLECYSTECTOMY WITH INTRAOPERATIVE CHOLANGIOGRAM;  Surgeon: Camellia Blush, MD;  Location: [facility];  Service: General;  Laterality: N/A;   DIAGNOSTIC LAPAROSCOPY     DILATION AND CURETTAGE OF UTERUS     KNEE SURGERY  04/2009   right knee, meniscus tear   LAPAROSCOPIC GASTRIC SLEEVE RESECTION WITH HIATAL HERNIA REPAIR N/A 10/03/2014   Procedure: LAPAROSCOPIC GASTRIC SLEEVE RESECTION WITH HIATAL HERNIA REPAIR W UPPER ENDO;  Surgeon: Camellia Blush, MD;  Location: [facility];  Service: General;  Laterality: N/A;   ORIF ANKLE FRACTURE Right 10/17/2013   Procedure: OPEN REDUCTION INTERNAL FIXATION (ORIF) RIGHT ANKLE DISTAL FIBULA/LATERAL MALLEOLUS;  Surgeon: Marcey Raman, MD;  Location: [facility];  Service: Orthopedics;  Laterality: Right;   OTHER SURGICAL HISTORY  1995?   removed scar tissue from colon   thigh tuck Bilateral    inner thigh tuck   TOTAL KNEE ARTHROPLASTY Right 09/15/2023   Procedure: ARTHROPLASTY, KNEE, TOTAL;  Surgeon: Ernie Cough, MD;  Location: [facility];  Service: Orthopedics;  Laterality: Right;   UPPER GI ENDOSCOPY N/A 10/03/2014   Procedure: UPPER GI ENDOSCOPY;  Surgeon: Camellia Blush, MD;  Location: [facility];  Service: General;  Laterality: N/A;   WRIST SURGERY  1980s   cyst removed from right wrist    SOCIAL HISTORY: Social History   Socioeconomic History   Marital status: Widowed    Spouse name: Not on file   Number of children: 1   Years of education: Not on file   Highest education level: Not on file   Occupational History   Occupation: Quality control ccordinator  Tobacco Use   Smoking status: Never   Smokeless tobacco: Never  Vaping Use   Vaping status: Never Used  Substance and Sexual Activity   Alcohol use: No   Drug use: No   Sexual activity: Not on file  Other Topics Concern   Not on file  Social History Narrative   Lives with husband who is transplant patient   Completed some college            Social Drivers of Health   Tobacco Use: Low Risk (01/25/2024)   Patient History    Smoking Tobacco Use: Never    Smokeless Tobacco Use: Never    Passive Exposure: Not on file  Financial Resource Strain: Not on file  Food Insecurity: No Food Insecurity (09/15/2023)   Epic    Worried About Programme Researcher, Broadcasting/film/video in the Last Year: Never true    Ran Out of Food in the Last Year: Never true  Transportation Needs: No Transportation Needs (09/15/2023)   Epic    Lack of  Transportation (Medical): No    Lack of Transportation (Non-Medical): No  Physical Activity: Not on file  Stress: Not on file  Social Connections: Patient Declined (09/15/2023)   Social Connection and Isolation Panel    Frequency of Communication with Friends and Family: Patient declined    Frequency of Social Gatherings with Friends and Family: Patient declined    Attends Religious Services: Patient declined    Active Member of Clubs or Organizations: Patient declined    Attends Banker Meetings: Patient declined    Marital Status: Patient declined  Intimate Partner Violence: Not At Risk (09/15/2023)   Epic    Fear of Current or Ex-Partner: No    Emotionally Abused: No    Physically Abused: No    Sexually Abused: No  Depression (PHQ2-9): Low Risk (01/26/2024)   Depression (PHQ2-9)    PHQ-2 Score: 0  Alcohol Screen: Not on file  Housing: Unknown (12/28/2023)   Received from [facility]   Epic    Unable to Pay for Housing in the Last Year: Not on file    Number of Times Moved in  the Last Year: Not on file    At any time in the past 12 months, were you homeless or living in a shelter (including now)?: No  Utilities: Not At Risk (09/15/2023)   Epic    Threatened with loss of utilities: No  Health Literacy: Not on file    FAMILY HISTORY: Family History  Problem Relation Age of Onset   Diabetes Mother    Hyperlipidemia Mother    Hypertension Mother    Heart attack Mother        58 y/o   Heart disease Father    Leukemia Father    Diabetes Sister    Hyperlipidemia Sister    Hypertension Sister    Diabetes Maternal Grandmother    Hyperlipidemia Maternal Grandmother    Hypertension Maternal Grandmother    Breast cancer Paternal Grandmother    Diabetes Brother    Hyperlipidemia Brother    Hypertension Brother    Heart attack Brother    Breast cancer Other        GM   Colon cancer Neg Hx     ALLERGIES:  is allergic to codeine, erythromycin, tetracyclines & related, darvon [propoxyphene], morphine and codeine, other, penicillins, and sulfa antibiotics.  MEDICATIONS:  Current Outpatient Medications  Medication Sig Dispense Refill   acetaminophen  (TYLENOL ) 500 MG tablet Take 2 tablets (1,000 mg total) by mouth every 6 (six) hours.     Albuterol  Sulfate (PROAIR  RESPICLICK) 108 (90 Base) MCG/ACT AEPB Inhale 2 puffs into the lungs 4 (four) times daily as needed. 1 each 3   Calcium  Citrate-Vitamin D  (CALCIUM  CITRATE+D3 PETITES PO) Take 4 tablets by mouth daily.     FLUoxetine  (PROZAC ) 20 MG capsule Take 20 mg by mouth daily.     FLUoxetine  (PROZAC ) 40 MG capsule Take 1 capsule (40 mg total) by mouth daily. 30 capsule 5   gabapentin  (NEURONTIN ) 600 MG tablet TAKE 1 TABLET BY MOUTH THREE TIMES DAILY 90 tablet 0   hydrOXYzine  (ATARAX ) 25 MG tablet TAKE ONE TO TWO TABLETS BY MOUTH DURING THE DAY AND ONE TO TWO TABLETS AT BEDTIME 120 tablet 0   Krill Oil 500 MG CAPS Take 500 mg by mouth daily.     methocarbamol  (ROBAXIN ) 500 MG tablet Take 1 tablet (500 mg total) by  mouth every 6 (six) hours as needed for muscle spasms. 40 tablet  2   montelukast  (SINGULAIR ) 10 MG tablet Take 1 tablet (10 mg total) by mouth at bedtime as needed. 90 tablet 1   Pediatric Multivit-Minerals (FLINTSTONES COMPLETE PO) Take 2 tablets by mouth daily.     polyethylene glycol powder (GLYCOLAX /MIRALAX ) 17 GM/SCOOP powder Take 17 g dissolved in liquid by mouth 2 (two) times daily. 238 g 0   rosuvastatin  (CRESTOR ) 40 MG tablet Take 40 mg by mouth daily.     traMADol  (ULTRAM ) 50 MG tablet Take 1 tablet (50 mg total) by mouth every 6 (six) hours as needed for moderate pain (pain score 4-6) or severe pain (pain score 7-10). 30 tablet 0   No current facility-administered medications for this visit.   Facility-Administered Medications Ordered in Other Visits  Medication Dose Route Frequency Provider Last Rate Last Admin   Chlorhexidine  Gluconate Cloth 2 % PADS 6 each  6 each Topical Once Cornett, Debby, MD       And   Chlorhexidine  Gluconate Cloth 2 % PADS 6 each  6 each Topical Once Cornett, Thomas, MD       All other systems were reviewed with the patient and are negative.  PHYSICAL EXAMINATION: ECOG PERFORMANCE STATUS: 0 - Asymptomatic  Vitals:   01/26/24 1425  BP: 115/74  Pulse: 83  Resp: 17  Temp: 97.6 F (36.4 C)  SpO2: 99%   Filed Weights   01/26/24 1425  Weight: 241 lb 14.4 oz (109.7 kg)    GENERAL:alert, no distress and comfortable No cervical adenopathy No palpable mass. No regional adenopathy CTA bilaterally RRR No LE edema.  LABORATORY DATA:  I have reviewed the data as listed Lab Results  Component Value Date   WBC 9.9 09/16/2023   HGB 11.3 (L) 09/16/2023   HCT 35.6 (L) 09/16/2023   MCV 95.7 09/16/2023   PLT 156 09/16/2023   Lab Results  Component Value Date   NA 137 09/16/2023   K 4.5 09/16/2023   CL 105 09/16/2023   CO2 23 09/16/2023    RADIOGRAPHIC STUDIES: I have personally reviewed the radiological reports and agreed with the findings  in the report.  ASSESSMENT AND PLAN:  Assessment & Plan Invasive ductal carcinoma of the left breast 1.3 cm, grade 2, estrogen and progesterone receptor positive her 2 neg invasive ductal carcinoma, no lymph node involvement demonstrated on imaging - Proceed with lumpectomy of the left breast in two days. - Order oncotype testing on tumor specimen post-operatively to assess recurrence risk and need for chemotherapy. - Refer to radiation oncology for adjuvant radiation therapy post-operatively, likely Monday through Friday for four weeks, to decrease local recurrence risk. - Initiate anti-estrogen therapy (oral medication) after completion of radiation, to be continued for five years, pending tolerance and side effect profile. - Monitor for side effects of anti-estrogen therapy, including hot flashes, joint stiffness, and bone density loss; adjust therapy as needed based on tolerability. - Monitor bone density during anti-estrogen therapy due to risk of accelerated bone loss. - Follow up after surgery and oncotype results to finalize adjuvant therapy plan. -She expressed understanding of all recommendations.    All questions were answered. The patient knows to call the clinic with any problems, questions or concerns.    Amber Stalls, MD 01/26/24

## 2024-01-26 NOTE — Progress Notes (Signed)

## 2024-01-26 NOTE — Therapy (Signed)
 " OUTPATIENT PHYSICAL THERAPY BREAST CANCER BASELINE EVALUATION   Patient Name: Taylor Silva MRN: [id number] DOB:June 21, 1955, 69 y.o., female Today's Date: 01/27/2024  END OF SESSION:  PT End of Session - 01/27/24 1300     Visit Number 1    Number of Visits 2    Date for Recertification  03/09/24    PT Start Time 1215   late   PT Stop Time 1300    PT Time Calculation (min) 45 min    Activity Tolerance Patient tolerated treatment well    Behavior During Therapy WFL for tasks assessed/performed          Past Medical History:  Diagnosis Date   Anxiety    Arthritis    Asthma    Depression    DJD (degenerative joint disease), lumbar    Fructose intolerance    GERD (gastroesophageal reflux disease)    H/O hiatal hernia    Hard of hearing    bilat    Headache    migraines   Heart murmur    echo done 80's neg   History of bronchitis    History of frequent urinary tract infections    History of vertigo    Hyperlipidemia    Hypertension    currently taking no medications    Obesity    Pinched nerve    in back   PONV (postoperative nausea and vomiting)    patch used last time   Pre-diabetes    Restless legs    Seasonal allergies    Sleep apnea    Intolerant to CPAP last sleep study   Tinnitus    Wears glasses    Past Surgical History:  Procedure Laterality Date   ABDOMINAL HYSTERECTOMY  1995   No oophorectomy   BREAST BIOPSY Left 12/18/2023   US  LT BREAST BX W LOC DEV 1ST LESION IMG BX SPEC US  GUIDE 12/18/2023 [facility]   BREAST BIOPSY  01/26/2024   MM LT RADIOACTIVE SEED LOC MAMMO GUIDE 01/26/2024 [facility]   BREATH TEK H PYLORI N/A 06/23/2014   Procedure: BREATH TEK H PYLORI;  Surgeon: Camellia Blush, MD;  Location: [facility];  Service: General;  Laterality: N/A;   CHOLECYSTECTOMY N/A 07/12/2015   Procedure: LAPAROSCOPIC CHOLECYSTECTOMY WITH INTRAOPERATIVE CHOLANGIOGRAM;  Surgeon: Camellia Blush, MD;  Location: [facility];  Service: General;   Laterality: N/A;   DIAGNOSTIC LAPAROSCOPY     DILATION AND CURETTAGE OF UTERUS     KNEE SURGERY  04/2009   right knee, meniscus tear   LAPAROSCOPIC GASTRIC SLEEVE RESECTION WITH HIATAL HERNIA REPAIR N/A 10/03/2014   Procedure: LAPAROSCOPIC GASTRIC SLEEVE RESECTION WITH HIATAL HERNIA REPAIR W UPPER ENDO;  Surgeon: Camellia Blush, MD;  Location: [facility];  Service: General;  Laterality: N/A;   ORIF ANKLE FRACTURE Right 10/17/2013   Procedure: OPEN REDUCTION INTERNAL FIXATION (ORIF) RIGHT ANKLE DISTAL FIBULA/LATERAL MALLEOLUS;  Surgeon: Marcey Raman, MD;  Location: [facility];  Service: Orthopedics;  Laterality: Right;   OTHER SURGICAL HISTORY  1995?   removed scar tissue from colon   thigh tuck Bilateral    inner thigh tuck   TOTAL KNEE ARTHROPLASTY Right 09/15/2023   Procedure: ARTHROPLASTY, KNEE, TOTAL;  Surgeon: Ernie Cough, MD;  Location: [facility];  Service: Orthopedics;  Laterality: Right;   UPPER GI ENDOSCOPY N/A 10/03/2014   Procedure: UPPER GI ENDOSCOPY;  Surgeon: Camellia Blush, MD;  Location: [facility];  Service: General;  Laterality: N/A;   WRIST SURGERY  1980s   cyst  removed from right wrist   Patient Active Problem List   Diagnosis Date Noted   Malignant neoplasm of upper-outer quadrant of left breast in female, estrogen receptor positive (HCC) 01/25/2024   S/P total knee arthroplasty, right 09/15/2023   Neck pain 10/26/2020   Sinusitis 02/21/2019   Asthma intermitent mild 02/04/2016   S/P laparoscopic cholecystectomy 07/12/2015   GERD (gastroesophageal reflux disease) 10/03/2014   Hiatal hernia 10/03/2014   S/P laparoscopic sleeve gastrectomy 10/03/2014   Preoperative clearance 05/19/2014   Hyperglycemia 04/06/2014   Hypertension 04/06/2014   Hyperlipidemia 04/06/2014   Headache 08/02/2013   Chronic rhinitis 08/02/2013   Obesity, Class III, BMI 40-49.9 (morbid obesity) (HCC) 10/06/2012   Encounter for screening for malignant neoplasm of breast 06/15/2012   RLS (restless legs syndrome)  06/15/2012   Depression 06/06/2011   Joint pain 06/06/2011   OSA (obstructive sleep apnea) 06/06/2011   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     REFERRING PROVIDER: Dr. Debby Cornettt  REFERRING DIAG: Left Breast Cancer  THERAPY DIAG:  Malignant neoplasm of upper-outer quadrant of left breast in female, estrogen receptor positive (HCC)  Abnormal posture  Rationale for Evaluation and Treatment: Rehabilitation  ONSET DATE: 12/21/2024  SUBJECTIVE:                                                                                                                                                                                           SUBJECTIVE STATEMENT: Patient reports she is here today to be seen by her medical team for her newly diagnosed left breast cancer.   PERTINENT HISTORY:  Patient was diagnosed on 12/21/2024 with left grade 2 IDC. It measures 1.1 cm and is located in the upper Outer Quadrant. It is ER+,PR+,Her 2- with a Ki67 of 15%.  She is scheduled for left lumpectomy with SLNB on 01/28/2024 PATIENT GOALS:   reduce lymphedema risk and learn post op HEP.   PAIN:  Are you having pain? Yes: NPRS scale: 2/10 Pain location: left breast Pain description: bruised after seeding yesterday Aggravating factors: nothing Relieving factors: nothing  PRECAUTIONS: Active CA , right shoulder intermittent pain  RED FLAGS: None   HAND DOMINANCE: right  WEIGHT BEARING RESTRICTIONS: No  FALLS:  Has patient fallen in last 6 months? Yes. Number of falls 1  LIVING ENVIRONMENT: Patient lives with: son and his wife and granddaughter live with her Lives in: House/apartment   OCCUPATION: retired  LEISURE: surfing the web, TV  PRIOR LEVEL OF FUNCTION: Independent   OBJECTIVE: Note: Objective measures were completed at Evaluation unless otherwise noted.  COGNITION: Overall cognitive status: Within functional limits for tasks assessed  POSTURE:  Forward head and rounded  shoulders posture  UPPER EXTREMITY AROM/PROM:  A/PROM RIGHT   eval   Shoulder extension 48, +  Shoulder flexion 147  Shoulder abduction 175  Shoulder internal rotation 68  Shoulder external rotation 95    (Blank rows = not tested)  A/PROM LEFT   eval  Shoulder extension 60  Shoulder flexion 149  Shoulder abduction 175  Shoulder internal rotation 65  Shoulder external rotation 105    (Blank rows = not tested)  CERVICAL AROM: All within fxl limits:   UPPER EXTREMITY STRENGTH: WFL  LYMPHEDEMA ASSESSMENTS (in cm):   LANDMARK RIGHT   eval  10 cm proximal to olecranon process from proximal aspect of olecranon 37  Olecranon process 27.1  10 cm proximal to ulnar styloid process from proximal aspect of styloid process 23.3  Just distal to ulnar styloid process 15.3  Across hand at thumb web space 18.5  At base of 2nd digit 6.3  (Blank rows = not tested)  LANDMARK LEFT   eval  10 cm proximal to olecranon process from proximal aspect of olecranon 38.0   Olecranon process 27.0  10 cm proximal to ulnar styloid process from proximal aspect of styloid process 23.9  Just distal to ulnar styloid process 15.3  Across hand at thumb web space 18.5  At base of 2nd digit 6.0  (Blank rows = not tested)  L-DEX LYMPHEDEMA SCREENING:  The patient was assessed using the L-Dex machine today to produce a lymphedema index baseline score. The patient will be reassessed on a regular basis (typically every 3 months) to obtain new L-Dex scores. If the score is > 6.5 points away from his/her baseline score indicating onset of subclinical lymphedema, it will be recommended to wear a compression garment for 4 weeks, 12 hours per day and then be reassessed. If the score continues to be > 6.5 points from baseline at reassessment, we will initiate lymphedema treatment. Assessing in this manner has a 95% rate of preventing clinically significant lymphedema.    QUICK DASH SURVEY: 4.55  PATIENT  EDUCATION:  Education details: Time spent educating patient on aspects of self-care to maximize post op recovery. Patient was educated on where and how to get a post op compression bra to use to reduce post op edema. Patient was also educated on the use of SOZO screenings and surveillance principles for early identification of lymphedema onset. She was instructed to use the post op pillow in the axilla for pressure and pain relief. Patient educated on lymphedema risk reduction and post op shoulder/posture HEP. Person educated: Patient Education method: Explanation, Demonstration, Handout Education comprehension: Patient verbalized understanding and returned demonstration  HOME EXERCISE PROGRAM: Patient was instructed today in a home exercise program today for post op shoulder range of motion. These included active assist shoulder flexion in sitting, scapular retraction, wall walking with shoulder abduction, and hands behind head external rotation.  She was encouraged to do these twice a day, holding 3 seconds and repeating 5 times when permitted by her physician.   ASSESSMENT:  CLINICAL IMPRESSION: Pts multidisciplinary medical team met prior to her assessments to determine a recommended treatment plan. She is planning to have a Left Lumpectomy with SLNB on 01/28/2024. She will benefit from a post op PT reassessment to determine needs and from L-Dex screens every 3 months for 2 years to detect subclinical lymphedema.  Pt will benefit from skilled therapeutic intervention to improve on the following deficits: Decreased knowledge of precautions,  impaired UE functional use, pain, decreased ROM, postural dysfunction.   PT treatment/interventions: ADL/self-care home management, pt/family education, therapeutic exercise  REHAB POTENTIAL: Good  CLINICAL DECISION MAKING: Stable/uncomplicated  EVALUATION COMPLEXITY: Low   GOALS: Goals reviewed with patient? YES  LONG TERM GOALS: (STG=LTG)     Name Target Date Goal status  1 Pt will be able to verbalize understanding of pertinent lymphedema risk reduction practices relevant to her dx specifically related to skin care.  Baseline:  No knowledge 01/27/2024 Achieved at eval  2 Pt will be able to return demo and/or verbalize understanding of the post op HEP related to regaining shoulder ROM. Baseline:  No knowledge 01/27/2024 Achieved at eval  3 Pt will be able to verbalize understanding of the importance of viewing the post op After Breast CA Class video for further lymphedema risk reduction education and therapeutic exercise.  Baseline:  No knowledge 01/27/2024 Achieved at eval  4 Pt will demo she has regained full shoulder ROM and function post operatively compared to baselines.  Baseline: See objective measurements taken today. 03/09/2024 NEW    PLAN:  PT FREQUENCY/DURATION: EVAL and 1 follow up appointment.   PLAN FOR NEXT SESSION: will reassess 3-4 weeks post op to determine needs.   Patient will follow up at outpatient cancer rehab 3-4 weeks following surgery.  If the patient requires physical therapy at that time, a specific plan will be dictated and sent to the referring physician for approval. The patient was educated today on appropriate basic range of motion exercises to begin post operatively and the importance of viewing the After Breast Cancer class video following surgery.  Patient was educated today on lymphedema risk reduction practices as it pertains to recommendations that will benefit the patient immediately following surgery.  She verbalized good understanding.    Physical Therapy Information for After Breast Cancer Surgery/Treatment:  Lymphedema is a swelling condition that you may be at risk for in your arm if you have lymph nodes removed from the armpit area.  After a sentinel node biopsy, the risk is approximately 5-9% and is higher after an axillary node dissection.  There is treatment available for this condition  and it is not life-threatening.  Contact your physician or physical therapist with concerns. You may begin the 4 shoulder/posture exercises (see additional sheet) when permitted by your physician (typically a week after surgery).  If you have drains, you may need to wait until those are removed before beginning range of motion exercises.  A general recommendation is to not lift your arms above shoulder height until drains are removed.  These exercises should be done to your tolerance and gently.  This is not a no pain/no gain type of recovery so listen to your body and stretch into the range of motion that you can tolerate, stopping if you have pain.  If you are having immediate reconstruction, ask your plastic surgeon about doing exercises as he or she may want you to wait. We encourage you to view the After Breast Cancer class video following surgery.  You will learn information related to lymphedema risk, prevention and treatment and additional exercises to regain mobility following surgery.   While undergoing any medical procedure or treatment, try to avoid blood pressure being taken or needle sticks from occurring on the arm on the side of cancer.   This recommendation begins after surgery and continues for the rest of your life.  This may help reduce your risk of getting lymphedema (swelling in your  arm). An excellent resource for those seeking information on lymphedema is the National Lymphedema Network's web site. It can be accessed at www.lymphnet.org If you notice swelling in your hand, arm or breast at any time following surgery (even if it is many years from now), please contact your doctor or physical therapist to discuss this.  Lymphedema can be treated at any time but it is easier for you if it is treated early on.  If you feel like your shoulder motion is not returning to normal in a reasonable amount of time, please contact your surgeon or physical therapist.  [facility]  Specialty Rehab [phone]. [address], [place] [address]  [facility] CLASS After Breast Cancer Class  After Breast Cancer Class is a specially designed exercise class video to assist you in a safe recover after having breast cancer surgery.  In this video you will learn how to get back to full function whether your drains were just removed or if you had surgery a month ago. The video can be viewed on this page: [email or web address].boyd-meyer.org/ or on YouTube here: https://[email or web address].  Class Goals  Understand specific stretches to improve the flexibility of you chest and shoulder. Learn ways to safely strengthen your upper body and improve your posture. Understand the warning signs of infection and why you may be at risk for an arm infection. Learn about Lymphedema and prevention.  ** You do not need to view this video until after surgery.  Drains should be removed to participate in the recommended exercises on the video.  Patient was instructed today in a home exercise program today for post op shoulder range of motion. These included active assist shoulder flexion in sitting, scapular retraction, wall walking with shoulder abduction, and hands behind head external rotation.  She was encouraged to do these twice a day, holding 3 seconds and repeating 5 times when permitted by her physician.    Grayce JINNY Sheldon, PT 01/27/2024, 1:02 PM   "

## 2024-01-27 ENCOUNTER — Other Ambulatory Visit: Payer: Self-pay

## 2024-01-27 ENCOUNTER — Ambulatory Visit: Attending: Surgery

## 2024-01-27 DIAGNOSIS — R293 Abnormal posture: Secondary | ICD-10-CM | POA: Insufficient documentation

## 2024-01-27 DIAGNOSIS — C50412 Malignant neoplasm of upper-outer quadrant of left female breast: Secondary | ICD-10-CM | POA: Diagnosis present

## 2024-01-27 DIAGNOSIS — Z170 Estrogen receptor positive status [ER+]: Secondary | ICD-10-CM | POA: Insufficient documentation

## 2024-01-28 ENCOUNTER — Encounter (HOSPITAL_BASED_OUTPATIENT_CLINIC_OR_DEPARTMENT_OTHER): Admission: RE | Payer: Self-pay | Source: Home / Self Care

## 2024-01-28 ENCOUNTER — Ambulatory Visit (HOSPITAL_BASED_OUTPATIENT_CLINIC_OR_DEPARTMENT_OTHER): Admitting: Anesthesiology

## 2024-01-28 ENCOUNTER — Encounter (HOSPITAL_BASED_OUTPATIENT_CLINIC_OR_DEPARTMENT_OTHER): Payer: Self-pay | Admitting: Surgery

## 2024-01-28 ENCOUNTER — Ambulatory Visit
Admission: RE | Admit: 2024-01-28 | Discharge: 2024-01-28 | Disposition: A | Discharge status: Home / Self Care | Source: Ambulatory Visit | Attending: Surgery | Admitting: Surgery

## 2024-01-28 ENCOUNTER — Telehealth: Payer: Self-pay | Admitting: Hematology and Oncology

## 2024-01-28 ENCOUNTER — Ambulatory Visit (HOSPITAL_BASED_OUTPATIENT_CLINIC_OR_DEPARTMENT_OTHER)
Admission: RE | Admit: 2024-01-28 | Discharge: 2024-01-28 | Disposition: A | Discharge status: Home / Self Care | Attending: Surgery | Admitting: Surgery

## 2024-01-28 DIAGNOSIS — Z1732 Human epidermal growth factor receptor 2 negative status: Secondary | ICD-10-CM | POA: Insufficient documentation

## 2024-01-28 DIAGNOSIS — C50912 Malignant neoplasm of unspecified site of left female breast: Secondary | ICD-10-CM

## 2024-01-28 DIAGNOSIS — Z170 Estrogen receptor positive status [ER+]: Secondary | ICD-10-CM | POA: Insufficient documentation

## 2024-01-28 DIAGNOSIS — D2262 Melanocytic nevi of left upper limb, including shoulder: Secondary | ICD-10-CM | POA: Insufficient documentation

## 2024-01-28 DIAGNOSIS — Z1721 Progesterone receptor positive status: Secondary | ICD-10-CM | POA: Insufficient documentation

## 2024-01-28 DIAGNOSIS — Z803 Family history of malignant neoplasm of breast: Secondary | ICD-10-CM | POA: Insufficient documentation

## 2024-01-28 DIAGNOSIS — E66813 Obesity, class 3: Secondary | ICD-10-CM | POA: Diagnosis not present

## 2024-01-28 DIAGNOSIS — C50412 Malignant neoplasm of upper-outer quadrant of left female breast: Secondary | ICD-10-CM | POA: Diagnosis present

## 2024-01-28 DIAGNOSIS — Z6838 Body mass index (BMI) 38.0-38.9, adult: Secondary | ICD-10-CM | POA: Insufficient documentation

## 2024-01-28 DIAGNOSIS — L918 Other hypertrophic disorders of the skin: Secondary | ICD-10-CM | POA: Diagnosis not present

## 2024-01-28 DIAGNOSIS — F418 Other specified anxiety disorders: Secondary | ICD-10-CM | POA: Insufficient documentation

## 2024-01-28 MED ORDER — CLINDAMYCIN PHOSPHATE 900 MG/50ML IV SOLN
INTRAVENOUS | Status: AC
  Filled 2024-01-28: qty 50

## 2024-01-28 MED ORDER — ONDANSETRON HCL 4 MG/2ML IJ SOLN
INTRAMUSCULAR | Status: AC
  Filled 2024-01-28: qty 4

## 2024-01-28 MED ORDER — LIDOCAINE HCL (CARDIAC) PF 100 MG/5ML IV SOSY
PREFILLED_SYRINGE | INTRAVENOUS | Status: DC | PRN
  Administered 2024-01-28: 40 mg via INTRAVENOUS

## 2024-01-28 MED ORDER — MIDAZOLAM HCL (PF) 2 MG/2ML IJ SOLN
2.0000 mg | Freq: Once | INTRAMUSCULAR | Status: AC
  Administered 2024-01-28: 1 mg via INTRAVENOUS

## 2024-01-28 MED ORDER — DEXAMETHASONE SODIUM PHOSPHATE 4 MG/ML IJ SOLN
INTRAMUSCULAR | Status: DC | PRN
  Administered 2024-01-28: 10 mg via INTRAVENOUS

## 2024-01-28 MED ORDER — BUPIVACAINE-EPINEPHRINE (PF) 0.5% -1:200000 IJ SOLN
INTRAMUSCULAR | Status: DC | PRN
  Administered 2024-01-28: 25 mL via PERINEURAL

## 2024-01-28 MED ORDER — PHENYLEPHRINE HCL (PRESSORS) 10 MG/ML IV SOLN
INTRAVENOUS | Status: DC | PRN
  Administered 2024-01-28: 80 ug via INTRAVENOUS

## 2024-01-28 MED ORDER — OXYCODONE HCL 5 MG PO TABS: 5.0000 mg | ORAL_TABLET | Freq: Once | ORAL | Status: DC | PRN

## 2024-01-28 MED ORDER — BUPIVACAINE-EPINEPHRINE (PF) 0.25% -1:200000 IJ SOLN
INTRAMUSCULAR | Status: DC | PRN
  Administered 2024-01-28: 15 mL

## 2024-01-28 MED ORDER — LACTATED RINGERS IV SOLN: INTRAVENOUS | Status: DC

## 2024-01-28 MED ORDER — MIDAZOLAM HCL 2 MG/2ML IJ SOLN
INTRAMUSCULAR | Status: AC
  Filled 2024-01-28: qty 2

## 2024-01-28 MED ORDER — KETOROLAC TROMETHAMINE 30 MG/ML IJ SOLN
INTRAMUSCULAR | Status: AC
  Filled 2024-01-28: qty 2

## 2024-01-28 MED ORDER — OXYCODONE HCL 5 MG PO TABS: 5.0000 mg | ORAL_TABLET | Freq: Four times a day (QID) | ORAL | 0 refills | Status: AC | PRN

## 2024-01-28 MED ORDER — FENTANYL CITRATE (PF) 100 MCG/2ML IJ SOLN
25.0000 ug | INTRAMUSCULAR | Status: DC | PRN
  Administered 2024-01-28: 50 ug via INTRAVENOUS

## 2024-01-28 MED ORDER — OXYCODONE HCL 5 MG/5ML PO SOLN: 5.0000 mg | Freq: Once | ORAL | Status: DC | PRN

## 2024-01-28 MED ORDER — ONDANSETRON HCL 4 MG/2ML IJ SOLN: 4.0000 mg | Freq: Four times a day (QID) | INTRAMUSCULAR | Status: DC | PRN

## 2024-01-28 MED ORDER — CLINDAMYCIN PHOSPHATE 900 MG/50ML IV SOLN
900.0000 mg | INTRAVENOUS | Status: AC
  Administered 2024-01-28: 900 mg via INTRAVENOUS

## 2024-01-28 MED ORDER — FENTANYL CITRATE (PF) 100 MCG/2ML IJ SOLN
INTRAMUSCULAR | Status: AC
  Filled 2024-01-28: qty 2

## 2024-01-28 MED ORDER — PROPOFOL 500 MG/50ML IV EMUL
INTRAVENOUS | Status: AC
  Filled 2024-01-28: qty 150

## 2024-01-28 MED ORDER — PROPOFOL 10 MG/ML IV BOLUS
INTRAVENOUS | Status: AC
  Filled 2024-01-28: qty 20

## 2024-01-28 MED ORDER — FENTANYL CITRATE (PF) 100 MCG/2ML IJ SOLN
100.0000 ug | Freq: Once | INTRAMUSCULAR | Status: AC
  Administered 2024-01-28: 50 ug via INTRAVENOUS

## 2024-01-28 MED ORDER — PROPOFOL 10 MG/ML IV BOLUS
INTRAVENOUS | Status: DC | PRN
  Administered 2024-01-28: 50 mg via INTRAVENOUS
  Administered 2024-01-28: 150 mg via INTRAVENOUS

## 2024-01-28 MED ORDER — ONDANSETRON HCL 4 MG/2ML IJ SOLN
INTRAMUSCULAR | Status: DC | PRN
  Administered 2024-01-28: 4 mg via INTRAVENOUS

## 2024-01-28 MED ORDER — FENTANYL CITRATE (PF) 100 MCG/2ML IJ SOLN
INTRAMUSCULAR | Status: DC | PRN
  Administered 2024-01-28 (×2): 50 ug via INTRAVENOUS

## 2024-01-28 MED ORDER — PROPOFOL 500 MG/50ML IV EMUL
INTRAVENOUS | Status: DC | PRN
  Administered 2024-01-28: 150 ug/kg/min via INTRAVENOUS

## 2024-01-28 MED ORDER — MAGTRACE LYMPHATIC TRACER
INTRAMUSCULAR | Status: DC | PRN
  Administered 2024-01-28: 2 mL via INTRAMUSCULAR

## 2024-01-28 NOTE — Telephone Encounter (Signed)
 I left voicemail for patient regarding 02/24/2024 MD appointment per staff message. A reminder has been mailed to address on file.

## 2024-01-28 NOTE — Transfer of Care (Signed)
 Immediate Anesthesia Transfer of Care Note  Patient: Taylor Silva  Procedure(s) Performed: BREAST LUMPECTOMY WITH RADIOACTIVE SEED AND SENTINEL LYMPH NODE BIOPSY (Left: Breast)  Patient Location: PACU  Anesthesia Type:General  Level of Consciousness: sedated  Airway & Oxygen Therapy: Patient Spontanous Breathing and Patient connected to face mask oxygen  Post-op Assessment: Report given to RN and Post -op Vital signs reviewed and stable  Post vital signs: Reviewed and stable  Last Vitals:  Vitals Value Taken Time  BP 121/93 01/28/24 13:00  Temp 36.5 C 01/28/24 12:58  Pulse 69 01/28/24 13:01  Resp 17 01/28/24 13:01  SpO2 100 % 01/28/24 13:01  Vitals shown include unfiled device data.  Last Pain:  Vitals:   01/28/24 1258  TempSrc:   PainSc: Asleep         Complications: No notable events documented.

## 2024-01-28 NOTE — Discharge Instructions (Addendum)
[facility] Office Phone Number [phone]  BREAST BIOPSY/ PARTIAL MASTECTOMY: POST OP INSTRUCTIONS  Always review your discharge instruction sheet given to you by the facility where your surgery was performed.  IF YOU HAVE DISABILITY OR FAMILY LEAVE FORMS, YOU MUST BRING THEM TO THE OFFICE FOR PROCESSING.  DO NOT GIVE THEM TO YOUR DOCTOR.  A prescription for pain medication may be given to you upon discharge.  Take your pain medication as prescribed, if needed.  If narcotic pain medicine is not needed, then you may take acetaminophen (Tylenol) or ibuprofen (Advil) as needed. Take your usually prescribed medications unless otherwise directed If you need a refill on your pain medication, please contact your pharmacy.  They will contact our office to request authorization.  Prescriptions will not be filled after 5pm or on week-ends. You should eat very light the first 24 hours after surgery, such as soup, crackers, pudding, etc.  Resume your normal diet the day after surgery. Most patients will experience some swelling and bruising in the breast.  Ice packs and a good support bra will help.  Swelling and bruising can take several days to resolve.  It is common to experience some constipation if taking pain medication after surgery.  Increasing fluid intake and taking a stool softener will usually help or prevent this problem from occurring.  A mild laxative (Milk of Magnesia or Miralax) should be taken according to package directions if there are no bowel movements after 48 hours. Unless discharge instructions indicate otherwise, you may remove your bandages 24-48 hours after surgery, and you may shower at that time.  You may have steri-strips (small skin tapes) in place directly over the incision.  These strips should be left on the skin for 7-10 days.  If your surgeon used skin glue on the incision, you may shower in 24 hours.  The glue will flake off over the next 2-3 weeks.  Any  sutures or staples will be removed at the office during your follow-up visit. ACTIVITIES:  You may resume regular daily activities (gradually increasing) beginning the next day.  Wearing a good support bra or sports bra minimizes pain and swelling.  You may have sexual intercourse when it is comfortable. You may drive when you no longer are taking prescription pain medication, you can comfortably wear a seatbelt, and you can safely maneuver your car and apply brakes. RETURN TO WORK:  ______________________________________________________________________________________ You should see your doctor in the office for a follow-up appointment approximately two weeks after your surgery.  Your doctor's nurse will typically make your follow-up appointment when she calls you with your pathology report.  Expect your pathology report 2-3 business days after your surgery.  You may call to check if you do not hear from us after three days. OTHER INSTRUCTIONS: _______________________________________________________________________________________________ _____________________________________________________________________________________________________________________________________ _____________________________________________________________________________________________________________________________________ _____________________________________________________________________________________________________________________________________  WHEN TO CALL YOUR DOCTOR: Fever over 101.0 Nausea and/or vomiting. Extreme swelling or bruising. Continued bleeding from incision. Increased pain, redness, or drainage from the incision.  The clinic staff is available to answer your questions during regular business hours.  Please don't hesitate to call and ask to speak to one of the nurses for clinical concerns.  If you have a medical emergency, go to the nearest emergency room or call 911.  A surgeon from [facility] is always on call at the hospital.  For further questions, please visit [email or web address]    Post Anesthesia Home Care Instructions  Activity: Get plenty of rest for the remainder of   the day. A responsible individual must stay with you for 24 hours following the procedure.  For the next 24 hours, DO NOT: -Drive a car -Operate machinery -Drink alcoholic beverages -Take any medication unless instructed by your physician -Make any legal decisions or sign important papers.  Meals: Start with liquid foods such as gelatin or soup. Progress to regular foods as tolerated. Avoid greasy, spicy, heavy foods. If nausea and/or vomiting occur, drink only clear liquids until the nausea and/or vomiting subsides. Call your physician if vomiting continues.  Special Instructions/Symptoms: Your throat may feel dry or sore from the anesthesia or the breathing tube placed in your throat during surgery. If this causes discomfort, gargle with warm salt water. The discomfort should disappear within 24 hours.  If you had a scopolamine patch placed behind your ear for the management of post- operative nausea and/or vomiting:  1. The medication in the patch is effective for 72 hours, after which it should be removed.  Wrap patch in a tissue and discard in the trash. Wash hands thoroughly with soap and water. 2. You may remove the patch earlier than 72 hours if you experience unpleasant side effects which may include dry mouth, dizziness or visual disturbances. 3. Avoid touching the patch. Wash your hands with soap and water after contact with the patch.      

## 2024-01-28 NOTE — Anesthesia Procedure Notes (Signed)
 Procedure Name: LMA Insertion Date/Time: 01/28/2024 11:28 AM  Performed by: Julieanne Fairy BROCKS, CRNAPre-anesthesia Checklist: Patient identified, Emergency Drugs available, Suction available and Patient being monitored Patient Re-evaluated:Patient Re-evaluated prior to induction Oxygen Delivery Method: Circle system utilized Preoxygenation: Pre-oxygenation with 100% oxygen Induction Type: IV induction Ventilation: Mask ventilation without difficulty LMA: LMA inserted LMA Size: 4.0 Number of attempts: 1 Airway Equipment and Method: Bite block Placement Confirmation: positive ETCO2 Tube secured with: Tape Dental Injury: Teeth and Oropharynx as per pre-operative assessment

## 2024-01-28 NOTE — H&P (Signed)
 History of Present Illness: Taylor Silva is a 69 y.o. female who is seen today as an office consultation for evaluation of New Consultation ( NEW BREAST CANCER -)  The patient is seen today for newly diagnosed left breast cancer. She had a 1.3 cm mass left breast upper outer quadrant measuring 1.3 cm core biopsy proven to be ER positive, PR positive, HER2/neu negative, KI 15%. No history of breast pain, breast mass or nipple discharge. She had a grandmother had breast cancer.  Review of Systems: A complete review of systems was obtained from the patient. I have reviewed this information and discussed as appropriate with the patient. See HPI as well for other ROS.    Medical History: Past Medical History:  Diagnosis Date  Anxiety  Arthritis  Asthma, unspecified asthma severity, unspecified whether complicated, unspecified whether persistent (HHS-HCC)  History of cancer   There is no problem list on file for this patient.  History reviewed. No pertinent surgical history.   Allergies  Allergen Reactions  Codeine Anaphylaxis, Other (See Comments) and Unknown  Passed out  fainting  Penicillins Hives, Other (See Comments) and Rash  Has patient had a PCN reaction causing immediate rash, facial/tongue/throat swelling, SOB or lightheadedness with hypotension: Yes Has patient had a PCN reaction causing severe rash involving mucus membranes or skin necrosis: No Has patient had a PCN reaction that required hospitalization No Has patient had a PCN reaction occurring within the last 10 years: No If all of the above answers are NO, then may proceed with Cephalosporin use. Tolerated Cephalosporin Date: 09/16/23.  Sulfa (Sulfonamide Antibiotics) Hives and Other (See Comments)   Current Outpatient Medications on File Prior to Visit  Medication Sig Dispense Refill  gabapentin  (NEURONTIN ) 600 MG tablet  rosuvastatin  (CRESTOR ) 40 MG tablet Take 40 mg by mouth once daily  fluoxetine  HCl  (PROZAC  ORAL)   No current facility-administered medications on file prior to visit.   Family History  Problem Relation Age of Onset  Stroke Mother  Obesity Mother  High blood pressure (Hypertension) Mother  Hyperlipidemia (Elevated cholesterol) Mother  Coronary Artery Disease (Blocked arteries around heart) Mother  Diabetes Mother  High blood pressure (Hypertension) Sister  Hyperlipidemia (Elevated cholesterol) Sister  Diabetes Sister  Diabetes Brother  High blood pressure (Hypertension) Brother  Hyperlipidemia (Elevated cholesterol) Brother    Social History   Tobacco Use  Smoking Status Never  Smokeless Tobacco Never    Social History   Socioeconomic History  Marital status: Widowed  Tobacco Use  Smoking status: Never  Smokeless tobacco: Never  Substance and Sexual Activity  Drug use: Never   Social Drivers of Health   Food Insecurity: No Food Insecurity (09/15/2023)  Received from [facility]  Hunger Vital Sign  Within the past 12 months, you worried that your food would run out before you got the money to buy more.: Never true  Within the past 12 months, the food you bought just didn't last and you didn't have money to get more.: Never true  Transportation Needs: No Transportation Needs (09/15/2023)  Received from [facility] - Transportation  In the past 12 months, has lack of transportation kept you from medical appointments or from getting medications?: No  In the past 12 months, has lack of transportation kept you from meetings, work, or from getting things needed for daily living?: No  Social Connections: Patient Declined (09/15/2023)  Received from [facility]  Social Connection and Isolation Panel  In a typical week, how  many times do you talk on the phone with family, friends, or neighbors?: Patient declined  How often do you get together with friends or relatives?: Patient declined  How often do you attend church or religious services?: Patient  declined  Do you belong to any clubs or organizations such as church groups, unions, fraternal or athletic groups, or school groups?: Patient declined  How often do you attend meetings of the clubs or organizations you belong to?: Patient declined  Are you married, widowed, divorced, separated, never married, or living with a partner?: Patient declined  Housing Stability: Unknown (12/28/2023)  Housing Stability Vital Sign  Homeless in the Last Year: No   Objective:   Vitals:  12/28/23 1024  BP: 116/80  Pulse: 75  Temp: 36.7 C (98 F)  SpO2: 97%  Weight: (!) 110.7 kg (244 lb)  Height: 165.1 cm (5' 5)   Body mass index is 40.6 kg/m.  Physical Exam Exam conducted with a chaperone present.  Cardiovascular:  Rate and Rhythm: Normal rate.  Pulmonary:  Effort: Pulmonary effort is normal.  Chest:  Breasts: Right: Normal. No mass or tenderness.  Left: Normal. No mass or tenderness.  Musculoskeletal:  Cervical back: Normal range of motion.  Lymphadenopathy:  Upper Body:  Right upper body: No axillary adenopathy.  Left upper body: No axillary adenopathy.  Skin: General: Skin is warm.  Neurological:  General: No focal deficit present.     Labs, Imaging and Diagnostic Testing:  FINAL DIAGNOSIS  1. Breast, left, needle core biopsy, 2:00 3cmfn (ribbon clip) : INVASIVE DUCTAL CARCINOMA TUBULE FORMATION: SCORE 3 NUCLEAR PLEOMORPHISM: SCORE 3 MITOTIC COUNT: SCORE 1 TOTAL SCORE: 7 OVERALL GRADE: 2 LYMPHOVASCULAR INVASION: IDENTIFIED CANCER LENGTH: 11 MM / 1.1 CM CALCIFICATIONS: NOT IDENTIFIED OTHER FINDINGS:NONE SEE NOTE  Diagnosis Note : Dr. Belvie reviewed the case and concurs with the interpretation. A breast prognostic profile (ER and PR) is pending and will be reported in an addendum. [facility] was notified on 12/21/2023.  DATE SIGNED OUT: 12/21/2023 ELECTRONIC SIGNATURE : Pepper Dutton Md, Pathologist, Electronic  Signature  MICROSCOPIC DESCRIPTION  CASE COMMENTS STAINS USED IN DIAGNOSIS: H&E-2 H&E-3 H&E-4 H&E *RECUT 1 SLIDE Stains used in diagnosis 1 Her2 by IHC, 1 ER-ACIS, 1 KI-67-ACIS, 1 PR-ACIS IHC scores are reported using ASCO/CAP scoring criteria. An IHC Score of 0 or 1+ is NEGATIVE for HER2, 3+ is POSITIVE for HER2, and 2+ is EQUIVOCAL. Equivocal results are reflexed to either FISH or IHC testing. Specimens are fixed in 10% Neutral Buffered Formalin for at least 6 hours and up to 72 hours. These tests have not be validated on decalcified tissue. Results should be interpreted with caution given the possibility of false negative results on decalcified specimens. Antibody Clone for HER2 is 4B5 (PATHWAY). Some of these immunohistochemical stains may have been developed and the performance characteristics determined by [facility]. Some may not have been cleared or approved by the U.S. Food and Drug Administration. The FDA has determined that such clearance or approval is not necessary. This test is used for clinical purposes. It should not be regarded as investigational or for research. This laboratory is certified under the Clinical Laboratory Improvement Amendments of 1988 (CLIA-88) as qualified to perform high complexity clinical laboratory testing. Estrogen receptor (6F11), immunohistochemical stains are performed on formalin fixed, paraffin embedded tissue using a 3,3-diaminobenzidine (DAB) chromogen and Leica Bond Autostainer System. The staining intensity of the nucleus is scored manually and is reported as the  percentage of tumor cell nuclei demonstrating specific nuclear staining.Specimens are fixed in 10% Neutral Buffered Formalin for at least 6 hours and up to 72 hours. These tests have not be validated on decalcified tissue. Results should be interpreted with caution given the possibility of false negative results on decalcified specimens. Ki-67 (MM1),  immunohistochemical stains are performed on formalin fixed, paraffin embedded tissue using a 3,3-diaminobenzidine (DAB) chromogen and Leica Bond Autostainer System. The staining intensity of the nucleus is scored manually and is reported as the percentage of tumor cell nuclei demonstrating specific nuclear staining.Specimens are fixed in 10% Neutral Buffered Formalin for at least 6 hours and up to 72 hours. These tests have not be validated on decalcified tissue. Results should be interpreted with caution given the possibility of false negative results on decalcified specimens. PR progesterone receptor (16), immunohistochemical stains are performed on formalin fixed, paraffin embedded tissue using a 3,3-diaminobenzidine (DAB) chromogen and Leica Bond Autostainer System. The staining intensity of the nucleus is scored manually and is reported as the percentage of tumor cell nuclei demonstrating specific nuclear staining.Specimens are fixed in 10% Neutral Buffered Formalin for at least 6 hours and up to 72 hours. These tests have not be validated on decalcified tissue. Results should be interpreted with caution given the possibility of false negative results on decalcified specimens.  ADDENDUM Breast, left, needle core biopsy PROGNOSTIC INDICATORS Results: IMMUNOHISTOCHEMICAL AND MORPHOMETRIC ANALYSIS PERFORMED MANUALLY The tumor cells are negative for Her2 (1+). Estrogen Receptor: 99%, positive, strong staining intensity Progesterone Receptor: 40%, positive, moderate to strong staining intensity Proliferation Marker Ki67: 15% COMMENT: The negative hormone receptor study(ies) in this case has an internal positive control.  REFERENCE RANGE ESTROGEN RECEPTOR NEGATIVE 0% POSITIVE =>1% REFERENCE RANGE PROGESTERONE RECEPTOR NEGATIVE 0% POSITIVE =>1% All controls stained appropriately Pepper Dutton Md, Pathologist, Electronic Signature ( Signed [phone] 2025)  CLINICAL  HISTORY  SPECIMEN(S) OBTAINED 1. Breast, left, needle core biopsy, 2:00 3cmfn (ribbon Clip)  SPECIMEN COMMENTS: 1. TIF: 13:50pm, CIT: < 1 min SPECIMEN CLINICAL INFORMATION: 1. 69 yr old woman with 1.3cm highly suspicious left breast mass  Gross Description 1. Received in formalin labeled Hickle,Carri and left breast 2 o'clock are three yellow cylindrical cores of fibroadipose tissue ranging from 1.0 x 0.2 x 0.2 cm to 1.3 x 0.2 x 0.2 cm, entirely submitted in one block. TIF 1350, CIT less than 1 min. (BP:gt, 12/21/23)  Report signed out from the following location(s) [place] [address] [place], [place] [address] CLIA #: [id number] CLINICAL DATA: LEFT breast callback  EXAM: DIGITAL DIAGNOSTIC UNILATERAL LEFT MAMMOGRAM WITH TOMOSYNTHESIS AND CAD; ULTRASOUND LEFT BREAST LIMITED  TECHNIQUE: Left digital diagnostic mammography and breast tomosynthesis was performed. The images were evaluated with computer-aided detection. ; Targeted ultrasound examination of the left breast was performed.  COMPARISON: Previous exam(s).  ACR Breast Density Category b: There are scattered areas of fibroglandular density.  FINDINGS: Spot compression tomosynthesis views demonstrates a persistent irregular mass with subtle associated distortion.  On physical exam, there is a firm mass in the upper outer breast.  Targeted ultrasound was performed of the LEFT breast. At 2 o'clock 3 cm from the nipple, there is an irregular hypoechoic mass with irregular margins. This measures 13 x 12 x 11 mm. This corresponds to the site of mammographic concern.  Targeted ultrasound was performed of the LEFT axilla. No suspicious axillary lymph nodes are visualized.  IMPRESSION: 1. There is suspicious 1.3 cm mass at the site of screening  mammographic concern. Recommend ultrasound-guided biopsy for definitive characterization. 2. No suspicious LEFT axillary  adenopathy.  RECOMMENDATION: LEFT breast ultrasound-guided biopsy x1  I have discussed the findings and recommendations with the patient. The biopsy procedure was discussed with the patient and questions were answered. Patient expressed their understanding of the biopsy recommendation. Patient will be scheduled for biopsy at her earliest convenience by the schedulers. Ordering provider will be notified. If applicable, a reminder letter will be sent to the patient regarding the next appointment.  BI-RADS CATEGORY 5: Highly suggestive of malignancy.   Electronically Signed By: Corean Salter M.D. On: 12/16/2023 10:44 Assessment and Plan:   Diagnoses and all orders for this visit:  Breast cancer, stage 1, estrogen receptor positive, left (CMS/HHS-HCC) - Ambulatory Referral to Oncology-Medical - Ambulatory Referral to Radiation Oncology   Reviewed breast conserving surgery versus mastectomy reconstruction. Local regional recurrence, overall survival and disease-free recurrence were reviewed with each operation. She has opted for left breast seed lumpectomy with left axillary sentinel lymph node mapping. Refer to medical and radiation oncology  Discussed the risk of lymphedema and shoulder stiffness.  The procedure has been discussed with the patient. Alternatives to surgery have been discussed with the patient. Risks of surgery include bleeding, Infection, Seroma formation, death, and the need for further surgery. The patient understands and wishes to proceed.   DEBBY CURTISTINE SHIPPER, MD

## 2024-01-28 NOTE — Op Note (Signed)
 Preoperative diagnosis stage I left breast anterior positive quadrant  Postoperative diagnosis: Same  Procedure: Left breast seed localized lumpectomy with left axillary deep sentinel lymph node mapping with injection of mag Excision of 1 cm left forearm skin tag  Surgeon: Debby Shipper, MD     Anesthesia: LMA with 0.25% Marcaine  with plus pectoral block  Drains: None  Specimen: Left breast mass with seed and clip- medial margin, 3 left axillary sentinel nodes separately    EBL: Minimal  Indications for procedure: The patient is a 69 year old female with stage I left breast cancer.  She presents today for breast conserving surgery for treatment of her stage I left breast cancer.The procedure has been discussed with the patient. Alternatives to surgery have been discussed with the patient.  Risks of surgery include bleeding,  Infection,  Seroma formation, death,  and the need for further surgery.   The patient understands and wishes to proceed. Sentinel lymph node mapping and dissection has been discussed with the patient.  Risk of bleeding,  Infection,  Seroma formation,  Additional procedures,,  Shoulder weakness ,  Shoulder stiffness,  Nerve and blood vessel injury and reaction to the mapping dyes have been discussed.  Alternatives to surgery have been discussed with the patient.  The patient agrees to proceed.    Description of procedure: The patient was met in the holding area questions were answered.  Left breast was marked as correct site and the left forearm skin tag was marked as well.  She was taken back to the operating placed spine upon the OR table.  After induction of general anesthesia, the left breast and left forearm were prepped and draped in sterile fashion and timeout performed.  The left forearm skin tag was excised which measured 1 cm.  It was closed with  4 Vicryl sutures.      2 cc of mag trace injected left breast massaged for 5 minutes.  Incision was made in the  left breast upper outer quadrant in a curvilinear fashion.  Dissection was carried down and all tissue around the seed and clip were excised with grossly negative margins.  Additional medial margin taken.  Imaging revealed both the seed clip and the specimen and the tissue was oriented with ink.  Clips were placed.  The cavity was closed with 3-0 Vicryl Monocryl.      Left axilla was interrogated with the symptomatic probe.  A 4 cm incision was made and 3  level   deep sentinel nodes were removed without difficulty.  Background counts approached baseline.  The long thoracic nerve, thoracodorsal trunk and exit vein were all preserved.  Hemostasis was achieved with cautery as well as Surgicel powder.  Once hemostasis was achieved, the wound was closed with 3-0 Vicryl, 4.  Dermabond applied.  All counts correct.  Breast binder placed.  The patient was then awoke extubated taken recovery in satisfactory condition.

## 2024-01-28 NOTE — Anesthesia Procedure Notes (Signed)
 Anesthesia Regional Block: Pectoralis block   Pre-Anesthetic Checklist: , timeout performed,  Correct Patient, Correct Site, Correct Laterality,  Correct Procedure, Correct Position, site marked,  Risks and benefits discussed,  Surgical consent,  Pre-op evaluation,  At surgeon's request and post-op pain management  Laterality: Left  Prep: chloraprep       Needles:  Injection technique: Single-shot  Needle Type: Echogenic Needle     Needle Length: 9cm  Needle Gauge: 21     Additional Needles:   Narrative:  Start time: 01/28/2024 11:00 AM End time: 01/28/2024 11:08 AM Injection made incrementally with aspirations every 5 mL.  Performed by: Personally  Anesthesiologist: Maryclare Cornet, MD  Additional Notes: Pt tolerated the procedure well.

## 2024-01-28 NOTE — Progress Notes (Signed)
Assisted Dr. Hodierne with left, pectoralis, ultrasound guided block. Side rails up, monitors on throughout procedure. See vital signs in flow sheet. Tolerated Procedure well. 

## 2024-01-28 NOTE — Interval H&P Note (Signed)
 History and Physical Interval Note:  01/28/2024 10:26 AM  Taylor Silva  has presented today for surgery, with the diagnosis of LEFT BREAST CANCER.  The various methods of treatment have been discussed with the patient and family. After consideration of risks, benefits and other options for treatment, the patient has consented to  Procedures: BREAST LUMPECTOMY WITH RADIOACTIVE SEED AND SENTINEL LYMPH NODE BIOPSY (Left) as a surgical intervention.  The patient's history has been reviewed, patient examined, no change in status, stable for surgery.  I have reviewed the patient's chart and labs.  Questions were answered to the patient's satisfaction.     Jashad Depaula A Jamesen Stahnke

## 2024-01-28 NOTE — Anesthesia Preprocedure Evaluation (Signed)
"                                    Anesthesia Evaluation  Patient identified by MRN, date of birth, ID band Patient awake    Reviewed: Allergy & Precautions, H&P , NPO status , Patient's Chart, lab work & pertinent test results  History of Anesthesia Complications (+) PONV and history of anesthetic complications  Airway Mallampati: II   Neck ROM: full    Dental   Pulmonary asthma , sleep apnea    breath sounds clear to auscultation       Cardiovascular hypertension,  Rhythm:regular Rate:Normal     Neuro/Psych  Headaches PSYCHIATRIC DISORDERS Anxiety Depression     Neuromuscular disease    GI/Hepatic hiatal hernia,GERD  ,,  Endo/Other    Class 3 obesity  Renal/GU      Musculoskeletal  (+) Arthritis ,    Abdominal   Peds  Hematology   Anesthesia Other Findings   Reproductive/Obstetrics Breast CA                              Anesthesia Physical Anesthesia Plan  ASA: 2  Anesthesia Plan: General   Post-op Pain Management: Regional block*   Induction: Intravenous  PONV Risk Score and Plan: 4 or greater and Ondansetron , Dexamethasone , Midazolam  and Treatment may vary due to age or medical condition  Airway Management Planned: LMA  Additional Equipment:   Intra-op Plan:   Post-operative Plan: Extubation in OR  Informed Consent: I have reviewed the patients History and Physical, chart, labs and discussed the procedure including the risks, benefits and alternatives for the proposed anesthesia with the patient or authorized representative who has indicated his/her understanding and acceptance.     Dental advisory given  Plan Discussed with: CRNA, Anesthesiologist and Surgeon  Anesthesia Plan Comments:         Anesthesia Quick Evaluation  "

## 2024-01-29 ENCOUNTER — Encounter (HOSPITAL_BASED_OUTPATIENT_CLINIC_OR_DEPARTMENT_OTHER): Payer: Self-pay | Admitting: Surgery

## 2024-01-29 NOTE — Anesthesia Postprocedure Evaluation (Signed)
"   Anesthesia Post Note  Patient: MILDA LINDVALL  Procedure(s) Performed: BREAST LUMPECTOMY WITH RADIOACTIVE SEED AND SENTINEL LYMPH NODE BIOPSY (Left: Breast)     Patient location during evaluation: PACU Anesthesia Type: General and Regional Level of consciousness: awake and alert Pain management: pain level controlled Vital Signs Assessment: post-procedure vital signs reviewed and stable Respiratory status: spontaneous breathing, nonlabored ventilation, respiratory function stable and patient connected to nasal cannula oxygen Cardiovascular status: blood pressure returned to baseline and stable Postop Assessment: no apparent nausea or vomiting Anesthetic complications: no   No notable events documented.  Last Vitals:  Vitals:   01/28/24 1330 01/28/24 1402  BP: 127/65 (!) 141/72  Pulse: 60 71  Resp: 15 16  Temp:  36.7 C  SpO2: 96% 95%    Last Pain:  Vitals:   01/28/24 1402  TempSrc:   PainSc: 5                  Keenan Dimitrov S      "

## 2024-01-30 ENCOUNTER — Encounter: Payer: Self-pay | Admitting: *Deleted

## 2024-01-30 DIAGNOSIS — C50412 Malignant neoplasm of upper-outer quadrant of left female breast: Secondary | ICD-10-CM

## 2024-01-30 NOTE — Progress Notes (Signed)
 Patient seen at initial med onc appt with Dr. Loretha. Has PT appt for baseline Sozo eval scheduled pre op. Explained role of navigation and gave patient my contact information. She was given Breast Cancer book and Journey resource notebook. Oncotype to be done post op (surgery 1/22) and has had Rad Onc consult. Patient expressed to navigator quite a concern for her son. She said she lives with son,daughter in social worker, granddaughter. States her son has a lot of health problems and she feels he is not getting appropriate care where he is going. She states his nerves are bad and that this past week they have had to call EMS x2 visits because of panic attacks. She said she feels this is related to her cancer diagnosis as was similar after she had knee replacement. Patient asked for guidance on where her son should go for PCP/medical care then she stated she was going to call her own PCP who she trusted and see if they can see him as a new patient. Patient quite worried about her son and how this cancer diagnosis is affecting him. Social work consult placed.

## 2024-02-01 LAB — SURGICAL PATHOLOGY

## 2024-02-02 ENCOUNTER — Telehealth: Payer: Self-pay | Admitting: *Deleted

## 2024-02-02 ENCOUNTER — Encounter: Payer: Self-pay | Admitting: *Deleted

## 2024-02-02 ENCOUNTER — Inpatient Hospital Stay: Admitting: Licensed Clinical Social Worker

## 2024-02-02 ENCOUNTER — Ambulatory Visit: Payer: Self-pay | Admitting: Surgery

## 2024-02-02 NOTE — Telephone Encounter (Signed)
 Oncotype ordered per Dr. Loretha. Requisition sent to [facility] [employer]

## 2024-02-02 NOTE — Progress Notes (Signed)
 [facility] Clinical Social Work  Clinical Social Work was referred by statistician for family support needs.  Clinical Social Worker contacted patient by phone to offer support and assess for needs.    Patient is doing well herself but has concerns for support for her son. In particular, wants to get him connected to primary care.   Interventions: Provided information on how to find a [place] PCP Offered information on how to connect with a peer mentor for family members, caregiver support group, or counseling resources if needed      Follow Up Plan:  Patient will contact CSW with any support or resource needs    Taylor Web E Valente Fosberg, LCSW  Clinical Social Worker [facility]

## 2024-02-10 ENCOUNTER — Encounter: Payer: Self-pay | Admitting: *Deleted

## 2024-02-10 ENCOUNTER — Encounter (HOSPITAL_COMMUNITY): Payer: Self-pay

## 2024-02-10 DIAGNOSIS — C50412 Malignant neoplasm of upper-outer quadrant of left female breast: Secondary | ICD-10-CM

## 2024-02-11 ENCOUNTER — Encounter (HOSPITAL_COMMUNITY): Payer: Self-pay

## 2024-02-12 NOTE — Progress Notes (Incomplete)
 Location of Breast Cancer:left ***  Histology per Pathology Report:    Receptor Status: ER(99), PR (40), Her2-neu (neg), Xp-32(84)  Did patient present with symptoms (if so, please note symptoms) or was this found on screening mammography?: screening mammogram  Past/Anticipated interventions by surgeon, if any: Dr. Vanderbilt 01/28/24   Past/Anticipated interventions by medical oncology, if any:     Lymphedema issues, if any:  {:18581} {t:21944}   Pain issues, if any:  {:18581} {PAIN DESCRIPTION:21022940}  SAFETY ISSUES: Prior radiation? {:18581} Pacemaker/ICD? {:18581} Possible current pregnancy?{:18581} Is the patient on methotrexate? {:18581}  Current Complaints / other details:  ***

## 2024-02-18 ENCOUNTER — Ambulatory Visit: Payer: Self-pay | Admitting: Rehabilitation

## 2024-02-22 ENCOUNTER — Ambulatory Visit

## 2024-02-22 ENCOUNTER — Ambulatory Visit: Admitting: Radiation Oncology

## 2024-02-24 ENCOUNTER — Inpatient Hospital Stay: Admitting: Hematology and Oncology

## 2024-02-24 ENCOUNTER — Ambulatory Visit: Admitting: Radiation Oncology
